# Patient Record
Sex: Female | Born: 1935 | Race: White | Hispanic: No | Smoking: Never smoker
Health system: Southern US, Community
[De-identification: ages and names within clinical notes are randomized; demographics above are authoritative.]

## PROBLEM LIST (undated history)

## (undated) DIAGNOSIS — E7800 Pure hypercholesterolemia, unspecified: Secondary | ICD-10-CM

## (undated) DIAGNOSIS — R053 Chronic cough: Secondary | ICD-10-CM

## (undated) DIAGNOSIS — R05 Cough: Secondary | ICD-10-CM

## (undated) DIAGNOSIS — H9190 Unspecified hearing loss, unspecified ear: Secondary | ICD-10-CM

## (undated) DIAGNOSIS — M810 Age-related osteoporosis without current pathological fracture: Secondary | ICD-10-CM

## (undated) DIAGNOSIS — E785 Hyperlipidemia, unspecified: Secondary | ICD-10-CM

## (undated) DIAGNOSIS — K219 Gastro-esophageal reflux disease without esophagitis: Secondary | ICD-10-CM

## (undated) DIAGNOSIS — C8590 Non-Hodgkin lymphoma, unspecified, unspecified site: Secondary | ICD-10-CM

## (undated) DIAGNOSIS — I10 Essential (primary) hypertension: Secondary | ICD-10-CM

---
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
## 1999-08-22 ENCOUNTER — Encounter: Admission: RE | Admit: 1999-08-22 | Discharge: 1999-08-22 | Payer: Self-pay | Admitting: *Deleted

## 1999-08-22 ENCOUNTER — Encounter: Payer: Self-pay | Admitting: *Deleted

## 2000-05-06 ENCOUNTER — Other Ambulatory Visit: Admission: RE | Admit: 2000-05-06 | Discharge: 2000-05-06 | Payer: Self-pay | Admitting: Internal Medicine

## 2000-07-06 ENCOUNTER — Ambulatory Visit (HOSPITAL_COMMUNITY): Admission: RE | Admit: 2000-07-06 | Discharge: 2000-07-06 | Payer: Self-pay | Admitting: *Deleted

## 2000-09-07 ENCOUNTER — Encounter: Admission: RE | Admit: 2000-09-07 | Discharge: 2000-09-07 | Payer: Self-pay | Admitting: Internal Medicine

## 2000-09-07 ENCOUNTER — Encounter: Payer: Self-pay | Admitting: Internal Medicine

## 2001-09-16 ENCOUNTER — Encounter: Payer: Self-pay | Admitting: Internal Medicine

## 2001-09-16 ENCOUNTER — Encounter: Admission: RE | Admit: 2001-09-16 | Discharge: 2001-09-16 | Payer: Self-pay | Admitting: Internal Medicine

## 2002-09-27 ENCOUNTER — Encounter: Admission: RE | Admit: 2002-09-27 | Discharge: 2002-09-27 | Payer: Self-pay | Admitting: Internal Medicine

## 2002-09-27 ENCOUNTER — Encounter: Payer: Self-pay | Admitting: Internal Medicine

## 2003-10-13 ENCOUNTER — Encounter: Admission: RE | Admit: 2003-10-13 | Discharge: 2003-10-13 | Payer: Self-pay | Admitting: Internal Medicine

## 2004-11-27 ENCOUNTER — Encounter: Admission: RE | Admit: 2004-11-27 | Discharge: 2004-11-27 | Payer: Self-pay | Admitting: Internal Medicine

## 2005-11-28 ENCOUNTER — Encounter: Admission: RE | Admit: 2005-11-28 | Discharge: 2005-11-28 | Payer: Self-pay | Admitting: Internal Medicine

## 2006-05-13 ENCOUNTER — Emergency Department (HOSPITAL_COMMUNITY): Admission: EM | Admit: 2006-05-13 | Discharge: 2006-05-13 | Payer: Self-pay | Admitting: Emergency Medicine

## 2006-12-17 ENCOUNTER — Encounter: Admission: RE | Admit: 2006-12-17 | Discharge: 2006-12-17 | Payer: Self-pay | Admitting: Internal Medicine

## 2007-12-27 ENCOUNTER — Encounter: Admission: RE | Admit: 2007-12-27 | Discharge: 2007-12-27 | Payer: Self-pay | Admitting: Internal Medicine

## 2008-12-27 ENCOUNTER — Encounter: Admission: RE | Admit: 2008-12-27 | Discharge: 2008-12-27 | Payer: Self-pay | Admitting: Internal Medicine

## 2009-01-27 HISTORY — PX: CATARACT EXTRACTION: SUR2

## 2010-01-04 ENCOUNTER — Encounter
Admission: RE | Admit: 2010-01-04 | Discharge: 2010-01-04 | Payer: Self-pay | Source: Home / Self Care | Attending: Internal Medicine | Admitting: Internal Medicine

## 2010-02-17 ENCOUNTER — Encounter: Payer: Self-pay | Admitting: Internal Medicine

## 2010-06-14 NOTE — Procedures (Signed)
[facility]  Patient:    Mary Kim, Mary Kim                        MRN: [id number] Adm. Date:  [id number] Attending:  Sabino Gasser                           Procedure Report  PROCEDURE:  Colonoscopy.  INDICATIONS:  Colon cancer screening.  ANESTHESIA:  Demerol 50 mg, Versed 5 mg.  DESCRIPTION OF PROCEDURE:  With patient mildly sedated in the left lateral decubitus position, the Olympus videoscopic pediatric colonoscope PCF-160 was inserted in the rectum and passed under direct vision to the cecum, identified by the ileocecal valve and the tip of the cecum, both of which were photographed.  From this point, the colonoscope was slowly withdrawn, taking circumferential views of the entire colonic mucosa, stopping only in the rectum, which appeared normal on direct view and showed small internal hemorrhoids on retroflexed view.  The endoscope was straightened and withdrawn.  The patients vital signs and pulse oximetry remained stable.  The patient tolerated the procedure well without apparent complications.  FINDINGS:  Internal hemorrhoids, otherwise unremarkable examination.  PLAN:  Repeat examination in five to 10 years. DD:  07/06/00 TD:  07/06/00 Job: [id number]

## 2010-12-16 ENCOUNTER — Other Ambulatory Visit: Payer: Self-pay | Admitting: Internal Medicine

## 2010-12-16 DIAGNOSIS — Z1231 Encounter for screening mammogram for malignant neoplasm of breast: Secondary | ICD-10-CM

## 2011-01-09 ENCOUNTER — Ambulatory Visit: Payer: Self-pay

## 2011-01-10 ENCOUNTER — Ambulatory Visit
Admission: RE | Admit: 2011-01-10 | Discharge: 2011-01-10 | Disposition: A | Discharge status: Home / Self Care | Payer: Medicare Other | Source: Ambulatory Visit | Attending: Internal Medicine | Admitting: Internal Medicine

## 2011-01-10 DIAGNOSIS — Z1231 Encounter for screening mammogram for malignant neoplasm of breast: Secondary | ICD-10-CM

## 2011-03-26 DIAGNOSIS — I10 Essential (primary) hypertension: Secondary | ICD-10-CM | POA: Diagnosis not present

## 2011-03-26 DIAGNOSIS — M810 Age-related osteoporosis without current pathological fracture: Secondary | ICD-10-CM | POA: Diagnosis not present

## 2011-03-26 DIAGNOSIS — E785 Hyperlipidemia, unspecified: Secondary | ICD-10-CM | POA: Diagnosis not present

## 2011-03-26 DIAGNOSIS — E559 Vitamin D deficiency, unspecified: Secondary | ICD-10-CM | POA: Diagnosis not present

## 2011-04-21 DIAGNOSIS — H3530 Unspecified macular degeneration: Secondary | ICD-10-CM | POA: Diagnosis not present

## 2011-04-21 DIAGNOSIS — Z961 Presence of intraocular lens: Secondary | ICD-10-CM | POA: Diagnosis not present

## 2011-04-21 DIAGNOSIS — H52209 Unspecified astigmatism, unspecified eye: Secondary | ICD-10-CM | POA: Diagnosis not present

## 2011-04-21 DIAGNOSIS — H43819 Vitreous degeneration, unspecified eye: Secondary | ICD-10-CM | POA: Diagnosis not present

## 2011-06-03 DIAGNOSIS — M810 Age-related osteoporosis without current pathological fracture: Secondary | ICD-10-CM | POA: Diagnosis not present

## 2011-09-19 DIAGNOSIS — R82998 Other abnormal findings in urine: Secondary | ICD-10-CM | POA: Diagnosis not present

## 2011-09-19 DIAGNOSIS — I10 Essential (primary) hypertension: Secondary | ICD-10-CM | POA: Diagnosis not present

## 2011-09-19 DIAGNOSIS — E785 Hyperlipidemia, unspecified: Secondary | ICD-10-CM | POA: Diagnosis not present

## 2011-09-19 DIAGNOSIS — M810 Age-related osteoporosis without current pathological fracture: Secondary | ICD-10-CM | POA: Diagnosis not present

## 2011-09-19 DIAGNOSIS — E559 Vitamin D deficiency, unspecified: Secondary | ICD-10-CM | POA: Diagnosis not present

## 2011-09-26 DIAGNOSIS — E785 Hyperlipidemia, unspecified: Secondary | ICD-10-CM | POA: Diagnosis not present

## 2011-09-26 DIAGNOSIS — Z0000 Encounter for general adult medical examination without abnormal findings: Secondary | ICD-10-CM | POA: Diagnosis not present

## 2011-09-26 DIAGNOSIS — Z23 Encounter for immunization: Secondary | ICD-10-CM | POA: Diagnosis not present

## 2011-09-26 DIAGNOSIS — I10 Essential (primary) hypertension: Secondary | ICD-10-CM | POA: Diagnosis not present

## 2011-09-30 DIAGNOSIS — Z1212 Encounter for screening for malignant neoplasm of rectum: Secondary | ICD-10-CM | POA: Diagnosis not present

## 2011-10-22 DIAGNOSIS — M810 Age-related osteoporosis without current pathological fracture: Secondary | ICD-10-CM | POA: Diagnosis not present

## 2011-12-04 DIAGNOSIS — M810 Age-related osteoporosis without current pathological fracture: Secondary | ICD-10-CM | POA: Diagnosis not present

## 2011-12-10 ENCOUNTER — Other Ambulatory Visit: Payer: Self-pay | Admitting: Internal Medicine

## 2011-12-10 DIAGNOSIS — Z1231 Encounter for screening mammogram for malignant neoplasm of breast: Secondary | ICD-10-CM

## 2011-12-15 ENCOUNTER — Emergency Department (HOSPITAL_COMMUNITY)
Admission: EM | Admit: 2011-12-15 | Discharge: 2011-12-15 | Disposition: A | Discharge status: Home / Self Care | Payer: Medicare Other | Attending: Emergency Medicine | Admitting: Emergency Medicine

## 2011-12-15 ENCOUNTER — Emergency Department (HOSPITAL_COMMUNITY): Payer: Medicare Other

## 2011-12-15 ENCOUNTER — Encounter (HOSPITAL_COMMUNITY): Payer: Self-pay

## 2011-12-15 DIAGNOSIS — R42 Dizziness and giddiness: Secondary | ICD-10-CM | POA: Insufficient documentation

## 2011-12-15 DIAGNOSIS — H9190 Unspecified hearing loss, unspecified ear: Secondary | ICD-10-CM | POA: Diagnosis not present

## 2011-12-15 DIAGNOSIS — R55 Syncope and collapse: Secondary | ICD-10-CM | POA: Diagnosis not present

## 2011-12-15 DIAGNOSIS — I10 Essential (primary) hypertension: Secondary | ICD-10-CM | POA: Insufficient documentation

## 2011-12-15 DIAGNOSIS — E785 Hyperlipidemia, unspecified: Secondary | ICD-10-CM | POA: Insufficient documentation

## 2011-12-15 DIAGNOSIS — M810 Age-related osteoporosis without current pathological fracture: Secondary | ICD-10-CM | POA: Insufficient documentation

## 2011-12-15 DIAGNOSIS — R404 Transient alteration of awareness: Secondary | ICD-10-CM | POA: Diagnosis not present

## 2011-12-15 DIAGNOSIS — R5381 Other malaise: Secondary | ICD-10-CM | POA: Diagnosis not present

## 2011-12-15 HISTORY — DX: Age-related osteoporosis without current pathological fracture: M81.0

## 2011-12-15 HISTORY — DX: Unspecified hearing loss, unspecified ear: H91.90

## 2011-12-15 HISTORY — DX: Hyperlipidemia, unspecified: E78.5

## 2011-12-15 HISTORY — DX: Essential (primary) hypertension: I10

## 2011-12-15 LAB — COMPREHENSIVE METABOLIC PANEL
ALT: 22 U/L (ref 0–35)
AST: 23 U/L (ref 0–37)
Albumin: 4 g/dL (ref 3.5–5.2)
Alkaline Phosphatase: 59 U/L (ref 39–117)
Calcium: 9.4 mg/dL (ref 8.4–10.5)
Chloride: 103 mEq/L (ref 96–112)
Creatinine, Ser: 1.02 mg/dL (ref 0.50–1.10)
Potassium: 4.4 mEq/L (ref 3.5–5.1)
Total Protein: 6.5 g/dL (ref 6.0–8.3)

## 2011-12-15 LAB — URINALYSIS, ROUTINE W REFLEX MICROSCOPIC
Glucose, UA: NEGATIVE mg/dL
Hgb urine dipstick: NEGATIVE
Nitrite: NEGATIVE
Protein, ur: 30 mg/dL — AB
Specific Gravity, Urine: 1.017 (ref 1.005–1.030)

## 2011-12-15 LAB — CBC WITH DIFFERENTIAL/PLATELET
Basophils Absolute: 0 10*3/uL (ref 0.0–0.1)
Basophils Relative: 1 % (ref 0–1)
HCT: 36.7 % (ref 36.0–46.0)
Hemoglobin: 12.3 g/dL (ref 12.0–15.0)
Lymphs Abs: 1.8 10*3/uL (ref 0.7–4.0)
MCH: 32.4 pg (ref 26.0–34.0)
MCHC: 33.5 g/dL (ref 30.0–36.0)
MCV: 96.6 fL (ref 78.0–100.0)
Monocytes Relative: 5 % (ref 3–12)
Neutrophils Relative %: 70 % (ref 43–77)
RBC: 3.8 MIL/uL — ABNORMAL LOW (ref 3.87–5.11)
RDW: 12.7 % (ref 11.5–15.5)
WBC: 7.8 10*3/uL (ref 4.0–10.5)

## 2011-12-15 LAB — URINE MICROSCOPIC-ADD ON

## 2011-12-15 LAB — POCT I-STAT TROPONIN I: Troponin i, poc: 0 ng/mL (ref 0.00–0.08)

## 2011-12-15 NOTE — ED Notes (Signed)
PT sts she was standing in her hallway talking to her friends, then she passed out.  Pt sts she got warm and became dizzy and the next thing she new she was coming to on the floor.

## 2011-12-15 NOTE — ED Provider Notes (Signed)
History     CSN: [id number]  Arrival date & time 12/15/11  2952   First MD Initiated Contact with Patient 12/15/11 1001      Chief Complaint  Patient presents with  . Loss of Consciousness    (Consider location/radiation/quality/duration/timing/severity/associated sxs/prior treatment) Patient is a 78 y.o. female presenting with syncope. The history is provided by the patient. No language interpreter was used.  Loss of Consciousness This is a new problem. The current episode started today. The problem occurs rarely. The problem has been resolved. Pertinent negatives include no abdominal pain, chest pain, congestion, coughing, fever, headaches, nausea, numbness, sore throat, swollen glands, urinary symptoms, vomiting or weakness. She has tried nothing for the symptoms.   78 year old female coming in with a near syncopal episode. States that she was standing in the doorway talking to her neighbor and became dizzy. She then eased herself into a sitting position and remembers going down to the floor. Denies LOC.   Patient states that this has never happened her before. States that one month ago she had a UTI and was treated with antibiotics. PCP is Dr. Timothy Lasso.  Patient did not hit her head. Denies chest pain, fever, shortness of breath, weakness, paresis, garbled speech, headache. Nn toxic appearance.   Past Medical History  Diagnosis Date  . Hypertension   . Hyperlipidemia   . Osteoporosis   . Hearing loss     Past Surgical History  Procedure Date  . Cataract extraction 2011    History reviewed. No pertinent family history.  History  Substance Use Topics  . Smoking status: Never Smoker   . Smokeless tobacco: Not on file  . Alcohol Use: No    OB History    Grav Para Term Preterm Abortions TAB SAB Ect Mult Living                  Review of Systems  Constitutional: Negative.  Negative for fever.  HENT: Negative.  Negative for congestion, sore throat, rhinorrhea and  postnasal drip.   Eyes: Negative.   Respiratory: Negative.  Negative for cough and shortness of breath.   Cardiovascular: Positive for syncope. Negative for chest pain and palpitations.  Gastrointestinal: Negative.  Negative for nausea, vomiting and abdominal pain.  Musculoskeletal: Negative for back pain and gait problem.  Neurological: Positive for light-headedness. Negative for syncope, facial asymmetry, speech difficulty, weakness, numbness and headaches.  Psychiatric/Behavioral: Negative.   All other systems reviewed and are negative.    Allergies  Review of patient's allergies indicates not on file.  Home Medications  No current outpatient prescriptions on file.  BP 137/77  Pulse 75  Temp 98.3 F (36.8 C) (Oral)  Resp 16  SpO2 100%  Physical Exam  Nursing note and vitals reviewed. Constitutional: She is oriented to person, place, and time. She appears well-developed and well-nourished.  HENT:  Head: Normocephalic and atraumatic.  Eyes: Conjunctivae normal and EOM are normal. Pupils are equal, round, and reactive to light.  Neck: Normal range of motion. Neck supple.  Cardiovascular: Normal rate, regular rhythm and normal heart sounds.   Pulmonary/Chest: Effort normal and breath sounds normal. No respiratory distress. She has no wheezes.  Abdominal: Soft. She exhibits no distension. There is no tenderness.  Musculoskeletal: Normal range of motion. She exhibits no edema and no tenderness.  Neurological: She is alert and oriented to person, place, and time. She has normal reflexes.  Skin: Skin is warm and dry.  Psychiatric: She has a normal mood  and affect.    ED Course  Procedures (including critical care time)  Patient feels better and wants to go home.    Dr Timothy Lasso notified patient is in ER .  He will follow her close after discharged. Office will call her tomorrow for appointment to be seen.     Labs Reviewed  CBC WITH DIFFERENTIAL  COMPREHENSIVE METABOLIC PANEL   URINALYSIS, ROUTINE W REFLEX MICROSCOPIC   No results found.   No diagnosis found.    MDM  78 yo female with near syncope vs syncope prior to arrival.  Patient remembers sitting slowly to the floor.  EKG unremarkable, - troponin, CBC and cmet unremarkable.  U/a with protein.  Chest x-ray shows no acute findings and reviewed by myself.  Patient wants to go home.  Discussed with Dr. Timothy Lasso and patient will follow up tomorrow.  Patient understands to return for worsening symptoms.  Shared visit with Dr. Rulon Abide.    Date: 12/15/2011  Rate: 69  Rhythm: normal sinus rhythm  QRS Axis: normal  Intervals: normal  ST/T Wave abnormalities: normal  Conduction Disutrbances:none  Narrative Interpretation:   Old EKG Reviewed: unchanged   Labs Reviewed  CBC WITH DIFFERENTIAL - Abnormal; Notable for the following:    RBC 3.80 (*)     All other components within normal limits  COMPREHENSIVE METABOLIC PANEL - Abnormal; Notable for the following:    Glucose, Bld 100 (*)     GFR calc non Af Amer 48 (*)     GFR calc Af Amer 56 (*)     All other components within normal limits  URINALYSIS, ROUTINE W REFLEX MICROSCOPIC - Abnormal; Notable for the following:    Protein, ur 30 (*)     All other components within normal limits  POCT I-STAT TROPONIN I  URINE MICROSCOPIC-ADD ON  LAB REPORT - SCANNED           Remi Haggard, NP 12/16/11 1239

## 2011-12-16 NOTE — ED Provider Notes (Signed)
Medical screening examination/treatment/procedure(s) were conducted as a shared visit with non-physician practitioner(s) and myself.  I personally evaluated the patient during the encounter Jones Skene, M.D.  Mary Kim is a 78 y.o. female presenting with near syncopal event.  Pt is imprecise about actual LOC, says she was speaking with a neighbor by the laundry, had a prodromal sensation of clammy skin, light sweating, lightheadedness, then sank to the floor against the wall and cam to rest on her buttocks.  Denies injury, this was witnessed, but witness not present in ER for interview. She had no other assocaited  VITAL SIGNS:   Filed Vitals:   12/15/11 1318  BP: 142/84  Pulse: 90  Temp:   Resp:    CONSTITUTIONAL: Awake, oriented x4, well appearing elderly woman appears non-toxic HENT: Atraumatic, normocephalic, oral mucosa pink and moist, airway patent. Nares patent without drainage. External ears normal. EYES: Conjunctiva clear, EOMI, PERRLA NECK: Trachea midline, non-tender, supple CARDIOVASCULAR: Normal heart rate, Normal rhythm, No murmurs, rubs, gallops PULMONARY/CHEST: Clear to auscultation, no rhonchi, wheezes, or rales. Symmetrical breath sounds. Non-tender. ABDOMINAL: Non-distended, soft, non-tender - no rebound or guarding.  BS normal. NEUROLOGIC: Non-focal, moving all four extremities, no gross sensory or motor deficits. EXTREMITIES: No clubbing, cyanosis, or edema SKIN: Warm, Dry, No erythema, No rash  JYN:WGNFAO Mary Kim is a 78 y.o. female presenting with syncope. Pt had clear prodrome - think this likely vagal episode versus potentially lethal arrythmia.  There were no other associated symptoms.  Pt is completely normal now.  Pt would like to go home, NP has arranged close follow up with Dr. Timothy Lasso tomorrow.  Pt understands medical plan and will follow up tomorrow.    Jones Skene, MD 12/16/11 [phone]

## 2011-12-19 DIAGNOSIS — Z1331 Encounter for screening for depression: Secondary | ICD-10-CM | POA: Diagnosis not present

## 2011-12-19 DIAGNOSIS — R55 Syncope and collapse: Secondary | ICD-10-CM | POA: Diagnosis not present

## 2011-12-30 DIAGNOSIS — I658 Occlusion and stenosis of other precerebral arteries: Secondary | ICD-10-CM | POA: Diagnosis not present

## 2011-12-30 DIAGNOSIS — R55 Syncope and collapse: Secondary | ICD-10-CM | POA: Diagnosis not present

## 2011-12-31 DIAGNOSIS — I359 Nonrheumatic aortic valve disorder, unspecified: Secondary | ICD-10-CM | POA: Diagnosis not present

## 2012-01-22 ENCOUNTER — Ambulatory Visit: Payer: Medicare Other

## 2012-01-27 ENCOUNTER — Ambulatory Visit
Admission: RE | Admit: 2012-01-27 | Discharge: 2012-01-27 | Disposition: A | Discharge status: Home / Self Care | Payer: Medicare Other | Source: Ambulatory Visit | Attending: Internal Medicine | Admitting: Internal Medicine

## 2012-01-27 DIAGNOSIS — Z1231 Encounter for screening mammogram for malignant neoplasm of breast: Secondary | ICD-10-CM

## 2012-03-26 DIAGNOSIS — I10 Essential (primary) hypertension: Secondary | ICD-10-CM | POA: Diagnosis not present

## 2012-03-26 DIAGNOSIS — E785 Hyperlipidemia, unspecified: Secondary | ICD-10-CM | POA: Diagnosis not present

## 2012-03-26 DIAGNOSIS — M810 Age-related osteoporosis without current pathological fracture: Secondary | ICD-10-CM | POA: Diagnosis not present

## 2012-03-26 DIAGNOSIS — R55 Syncope and collapse: Secondary | ICD-10-CM | POA: Diagnosis not present

## 2012-04-22 DIAGNOSIS — Z961 Presence of intraocular lens: Secondary | ICD-10-CM | POA: Diagnosis not present

## 2012-04-22 DIAGNOSIS — H3530 Unspecified macular degeneration: Secondary | ICD-10-CM | POA: Diagnosis not present

## 2012-04-22 DIAGNOSIS — H43819 Vitreous degeneration, unspecified eye: Secondary | ICD-10-CM | POA: Diagnosis not present

## 2012-04-22 DIAGNOSIS — H02059 Trichiasis without entropian unspecified eye, unspecified eyelid: Secondary | ICD-10-CM | POA: Diagnosis not present

## 2012-05-04 DIAGNOSIS — H905 Unspecified sensorineural hearing loss: Secondary | ICD-10-CM | POA: Diagnosis not present

## 2012-06-03 DIAGNOSIS — E559 Vitamin D deficiency, unspecified: Secondary | ICD-10-CM | POA: Diagnosis not present

## 2012-06-03 DIAGNOSIS — M810 Age-related osteoporosis without current pathological fracture: Secondary | ICD-10-CM | POA: Diagnosis not present

## 2012-08-25 DIAGNOSIS — L820 Inflamed seborrheic keratosis: Secondary | ICD-10-CM | POA: Diagnosis not present

## 2012-09-24 DIAGNOSIS — E559 Vitamin D deficiency, unspecified: Secondary | ICD-10-CM | POA: Diagnosis not present

## 2012-09-24 DIAGNOSIS — E785 Hyperlipidemia, unspecified: Secondary | ICD-10-CM | POA: Diagnosis not present

## 2012-09-24 DIAGNOSIS — I10 Essential (primary) hypertension: Secondary | ICD-10-CM | POA: Diagnosis not present

## 2012-10-01 DIAGNOSIS — H9190 Unspecified hearing loss, unspecified ear: Secondary | ICD-10-CM | POA: Diagnosis not present

## 2012-10-01 DIAGNOSIS — Z1331 Encounter for screening for depression: Secondary | ICD-10-CM | POA: Diagnosis not present

## 2012-10-01 DIAGNOSIS — E781 Pure hyperglyceridemia: Secondary | ICD-10-CM | POA: Diagnosis not present

## 2012-10-01 DIAGNOSIS — Z23 Encounter for immunization: Secondary | ICD-10-CM | POA: Diagnosis not present

## 2012-10-01 DIAGNOSIS — Z1212 Encounter for screening for malignant neoplasm of rectum: Secondary | ICD-10-CM | POA: Diagnosis not present

## 2012-10-01 DIAGNOSIS — I454 Nonspecific intraventricular block: Secondary | ICD-10-CM | POA: Diagnosis not present

## 2012-10-01 DIAGNOSIS — E785 Hyperlipidemia, unspecified: Secondary | ICD-10-CM | POA: Diagnosis not present

## 2012-10-01 DIAGNOSIS — M810 Age-related osteoporosis without current pathological fracture: Secondary | ICD-10-CM | POA: Diagnosis not present

## 2012-10-01 DIAGNOSIS — R55 Syncope and collapse: Secondary | ICD-10-CM | POA: Diagnosis not present

## 2012-10-01 DIAGNOSIS — Z0000 Encounter for general adult medical examination without abnormal findings: Secondary | ICD-10-CM | POA: Diagnosis not present

## 2012-10-01 DIAGNOSIS — I10 Essential (primary) hypertension: Secondary | ICD-10-CM | POA: Diagnosis not present

## 2012-12-07 DIAGNOSIS — M810 Age-related osteoporosis without current pathological fracture: Secondary | ICD-10-CM | POA: Diagnosis not present

## 2012-12-29 ENCOUNTER — Other Ambulatory Visit: Payer: Self-pay

## 2012-12-29 DIAGNOSIS — Z1231 Encounter for screening mammogram for malignant neoplasm of breast: Secondary | ICD-10-CM

## 2013-01-23 ENCOUNTER — Encounter (HOSPITAL_COMMUNITY): Payer: Self-pay | Admitting: Emergency Medicine

## 2013-01-23 ENCOUNTER — Emergency Department (HOSPITAL_COMMUNITY)
Admission: EM | Admit: 2013-01-23 | Discharge: 2013-01-23 | Disposition: A | Discharge status: Home / Self Care | Payer: Medicare Other | Attending: Emergency Medicine | Admitting: Emergency Medicine

## 2013-01-23 DIAGNOSIS — E785 Hyperlipidemia, unspecified: Secondary | ICD-10-CM | POA: Insufficient documentation

## 2013-01-23 DIAGNOSIS — I10 Essential (primary) hypertension: Secondary | ICD-10-CM | POA: Insufficient documentation

## 2013-01-23 DIAGNOSIS — S01501A Unspecified open wound of lip, initial encounter: Secondary | ICD-10-CM | POA: Insufficient documentation

## 2013-01-23 DIAGNOSIS — Z79899 Other long term (current) drug therapy: Secondary | ICD-10-CM | POA: Diagnosis not present

## 2013-01-23 DIAGNOSIS — R6889 Other general symptoms and signs: Secondary | ICD-10-CM | POA: Diagnosis not present

## 2013-01-23 DIAGNOSIS — H9190 Unspecified hearing loss, unspecified ear: Secondary | ICD-10-CM | POA: Insufficient documentation

## 2013-01-23 DIAGNOSIS — Y9301 Activity, walking, marching and hiking: Secondary | ICD-10-CM | POA: Insufficient documentation

## 2013-01-23 DIAGNOSIS — S022XXA Fracture of nasal bones, initial encounter for closed fracture: Secondary | ICD-10-CM | POA: Diagnosis not present

## 2013-01-23 DIAGNOSIS — Z7982 Long term (current) use of aspirin: Secondary | ICD-10-CM | POA: Diagnosis not present

## 2013-01-23 DIAGNOSIS — S01511A Laceration without foreign body of lip, initial encounter: Secondary | ICD-10-CM

## 2013-01-23 DIAGNOSIS — W010XXA Fall on same level from slipping, tripping and stumbling without subsequent striking against object, initial encounter: Secondary | ICD-10-CM | POA: Insufficient documentation

## 2013-01-23 DIAGNOSIS — W19XXXA Unspecified fall, initial encounter: Secondary | ICD-10-CM

## 2013-01-23 DIAGNOSIS — Z8739 Personal history of other diseases of the musculoskeletal system and connective tissue: Secondary | ICD-10-CM | POA: Diagnosis not present

## 2013-01-23 DIAGNOSIS — Y9229 Other specified public building as the place of occurrence of the external cause: Secondary | ICD-10-CM | POA: Insufficient documentation

## 2013-01-23 MED ORDER — HYDROCODONE-ACETAMINOPHEN 5-325 MG PO TABS
1.0000 | ORAL_TABLET | Freq: Once | ORAL | Status: AC
  Administered 2013-01-23: 1 via ORAL
  Filled 2013-01-23: qty 1

## 2013-01-23 NOTE — ED Provider Notes (Signed)
Medical screening examination/treatment/procedure(s) were conducted as a shared visit with non-physician practitioner(s) and myself.  I personally evaluated the patient during the encounter.  EKG Interpretation   None       Medication for CT head, neck, face imaging.  Obvious nasal fracture.  No tenderness over her maxillary sinuses.  No trismus or malocclusion.  No tenderness over the bilateral TMJ joints.  No dental intrusion injury.  lip laceration repaired.  Full range of motion bilateral hips.  Alert and oriented.  Non focal neuro exam  Lyanne Co, MD 01/23/13 1650

## 2013-01-23 NOTE — ED Provider Notes (Signed)
CSN: [id number]     Arrival date & time 01/23/13  1255 History   First MD Initiated Contact with Patient 01/23/13 1403     Chief Complaint  Patient presents with  . Fall  . Lip Laceration   (Consider location/radiation/quality/duration/timing/severity/associated sxs/prior Treatment) HPI Patient presents emergency department following a fall that occurred just prior to arrival patient states she was walking in her [employer] parking lot, when she slipped on some gravel and fell.  She states she did not lose consciousness.  Does not think she hit her head other than her mouth and nose.  Patient denies loss consciousness, chest pain, shortness of breath, weakness, numbness, dizziness, abdominal pain, back pain, neck pain, blurred vision, headache, dizziness, or syncope.  The patient, states, that she did not take any medications prior to arrival. Past Medical History  Diagnosis Date  . Hypertension   . Hyperlipidemia   . Osteoporosis   . Hearing loss    Past Surgical History  Procedure Laterality Date  . Cataract extraction  2011   No family history on file. History  Substance Use Topics  . Smoking status: Never Smoker   . Smokeless tobacco: Not on file  . Alcohol Use: No   OB History   Grav Para Term Preterm Abortions TAB SAB Ect Mult Living                 Review of Systems All other systems negative except as documented in the HPI. All pertinent positives and negatives as reviewed in the HPI. Allergies  Review of patient's allergies indicates no known allergies.  Home Medications   Current Outpatient Rx  Name  Route  Sig  Dispense  Refill  . amLODipine (NORVASC) 10 MG tablet   Oral   Take 10 mg by mouth daily.         Marland Kitchen aspirin EC 81 MG tablet   Oral   Take 81 mg by mouth daily.         Marland Kitchen atorvastatin (LIPITOR) 20 MG tablet   Oral   Take 20 mg by mouth daily.         . Calcium Carbonate-Vitamin D (CALTRATE 600+D) 600-400 MG-UNIT per tablet   Oral   Take 1  tablet by mouth daily.         . cholecalciferol (VITAMIN D) 1000 UNITS tablet   Oral   Take 1,000 Units by mouth daily.         Marland Kitchen denosumab (PROLIA) 60 MG/ML SOLN injection   Subcutaneous   Inject 60 mg into the skin every 6 (six) months. Given in May and Nov. - Administer in upper arm, thigh, or abdomen         . Multiple Vitamin (MULTIVITAMIN WITH MINERALS) TABS   Oral   Take 1 tablet by mouth daily.         . Multiple Vitamins-Minerals (VISION-VITE PRESERVE PO)   Oral   Take 1 tablet by mouth daily.          BP 154/85  Pulse 80  Temp(Src) 98.5 F (36.9 C) (Oral)  Resp 16  SpO2 95% Physical Exam  Nursing note and vitals reviewed. Constitutional: She is oriented to person, place, and time. She appears well-developed and well-nourished. No distress.  HENT:  Head: Normocephalic.  Mouth/Throat: Oropharynx is clear and moist.  Eyes: Pupils are equal, round, and reactive to light.  Neck: Normal range of motion. Neck supple.  Cardiovascular: Normal rate, regular rhythm and normal heart  sounds.  Exam reveals no gallop and no friction rub.   No murmur heard. Pulmonary/Chest: Effort normal and breath sounds normal. No respiratory distress.  Neurological: She is alert and oriented to person, place, and time. She exhibits normal muscle tone. Coordination normal.  Skin: Skin is warm and dry.    ED Course  Procedures (including critical care time) Patient has a nasal fracture, based on clinical exam.  Patient referred back to her primary care Dr. for further evaluation.  Told of sutures out in 5 days.  LACERATION REPAIR Performed by: Carlyle Dolly Authorized by: Carlyle Dolly Consent: Verbal consent obtained. Risks and benefits: risks, benefits and alternatives were discussed Consent given by: patient Patient identity confirmed: provided demographic data Prepped and Draped in normal sterile fashion Wound explored  Laceration Location: Mid upper  lip  Laceration Length: 3 cm  No Foreign Bodies seen or palpated  Anesthesia: local infiltration  Local anesthetic: lidocaine 2 % without epinephrine  Anesthetic total: 3 ml  Irrigation method: syringe Amount of cleaning: standard  Skin closure: 7-0 Prolene   Number of sutures: 6   Technique: Simple, interrupted   Patient tolerance: Patient tolerated the procedure well with no immediate complications.    Carlyle Dolly, PA-C 01/23/13 [phone]

## 2013-01-23 NOTE — ED Notes (Addendum)
Per EMS: Pt reports falling twenty minutes ago at her church in the parking lot. Pt tripped while walking out. Pt denies hitting her head, denies LOC, denies anticoagulant therapy. Pt has laceration to the upper lip that is 2 cm in size and goes through to the exterior of the lip, pt reports biting her lip. Pt had an artifical cap to the left central incisor, which the surface of that cap was removed during the fall. Pt is A/O x4, ambulatory, bleeding is controlled at this time, and patient has no other complaint.

## 2013-01-23 NOTE — ED Notes (Signed)
Patient ambulated without assistance to restroom and back.

## 2013-01-28 ENCOUNTER — Ambulatory Visit: Payer: Medicare Other

## 2013-02-02 DIAGNOSIS — IMO0002 Reserved for concepts with insufficient information to code with codable children: Secondary | ICD-10-CM | POA: Diagnosis not present

## 2013-02-02 DIAGNOSIS — Z9181 History of falling: Secondary | ICD-10-CM | POA: Diagnosis not present

## 2013-02-02 DIAGNOSIS — S01501A Unspecified open wound of lip, initial encounter: Secondary | ICD-10-CM | POA: Diagnosis not present

## 2013-02-10 ENCOUNTER — Ambulatory Visit: Payer: Medicare Other

## 2013-03-31 DIAGNOSIS — E785 Hyperlipidemia, unspecified: Secondary | ICD-10-CM | POA: Diagnosis not present

## 2013-03-31 DIAGNOSIS — S01501A Unspecified open wound of lip, initial encounter: Secondary | ICD-10-CM | POA: Diagnosis not present

## 2013-03-31 DIAGNOSIS — M810 Age-related osteoporosis without current pathological fracture: Secondary | ICD-10-CM | POA: Diagnosis not present

## 2013-03-31 DIAGNOSIS — IMO0002 Reserved for concepts with insufficient information to code with codable children: Secondary | ICD-10-CM | POA: Diagnosis not present

## 2013-03-31 DIAGNOSIS — I10 Essential (primary) hypertension: Secondary | ICD-10-CM | POA: Diagnosis not present

## 2013-04-25 DIAGNOSIS — Z961 Presence of intraocular lens: Secondary | ICD-10-CM | POA: Diagnosis not present

## 2013-04-25 DIAGNOSIS — H3530 Unspecified macular degeneration: Secondary | ICD-10-CM | POA: Diagnosis not present

## 2013-04-25 DIAGNOSIS — H52209 Unspecified astigmatism, unspecified eye: Secondary | ICD-10-CM | POA: Diagnosis not present

## 2013-04-25 DIAGNOSIS — H43819 Vitreous degeneration, unspecified eye: Secondary | ICD-10-CM | POA: Diagnosis not present

## 2013-06-07 DIAGNOSIS — M810 Age-related osteoporosis without current pathological fracture: Secondary | ICD-10-CM | POA: Diagnosis not present

## 2013-06-08 ENCOUNTER — Ambulatory Visit
Admission: RE | Admit: 2013-06-08 | Discharge: 2013-06-08 | Disposition: A | Discharge status: Home / Self Care | Payer: Medicare Other | Source: Ambulatory Visit

## 2013-06-08 ENCOUNTER — Encounter (INDEPENDENT_AMBULATORY_CARE_PROVIDER_SITE_OTHER): Payer: Self-pay

## 2013-06-08 DIAGNOSIS — Z1231 Encounter for screening mammogram for malignant neoplasm of breast: Secondary | ICD-10-CM

## 2013-09-05 DIAGNOSIS — J209 Acute bronchitis, unspecified: Secondary | ICD-10-CM | POA: Diagnosis not present

## 2013-09-05 DIAGNOSIS — I10 Essential (primary) hypertension: Secondary | ICD-10-CM | POA: Diagnosis not present

## 2013-09-05 DIAGNOSIS — R0602 Shortness of breath: Secondary | ICD-10-CM | POA: Diagnosis not present

## 2013-09-05 DIAGNOSIS — IMO0002 Reserved for concepts with insufficient information to code with codable children: Secondary | ICD-10-CM | POA: Diagnosis not present

## 2013-09-27 DIAGNOSIS — E781 Pure hyperglyceridemia: Secondary | ICD-10-CM | POA: Diagnosis not present

## 2013-09-27 DIAGNOSIS — E559 Vitamin D deficiency, unspecified: Secondary | ICD-10-CM | POA: Diagnosis not present

## 2013-09-27 DIAGNOSIS — I10 Essential (primary) hypertension: Secondary | ICD-10-CM | POA: Diagnosis not present

## 2013-09-27 DIAGNOSIS — E785 Hyperlipidemia, unspecified: Secondary | ICD-10-CM | POA: Diagnosis not present

## 2013-10-04 DIAGNOSIS — Z1212 Encounter for screening for malignant neoplasm of rectum: Secondary | ICD-10-CM | POA: Diagnosis not present

## 2013-10-04 DIAGNOSIS — Z1331 Encounter for screening for depression: Secondary | ICD-10-CM | POA: Diagnosis not present

## 2013-10-04 DIAGNOSIS — M810 Age-related osteoporosis without current pathological fracture: Secondary | ICD-10-CM | POA: Diagnosis not present

## 2013-10-04 DIAGNOSIS — E785 Hyperlipidemia, unspecified: Secondary | ICD-10-CM | POA: Diagnosis not present

## 2013-10-04 DIAGNOSIS — R141 Gas pain: Secondary | ICD-10-CM | POA: Diagnosis not present

## 2013-10-04 DIAGNOSIS — R059 Cough, unspecified: Secondary | ICD-10-CM | POA: Diagnosis not present

## 2013-10-04 DIAGNOSIS — I10 Essential (primary) hypertension: Secondary | ICD-10-CM | POA: Diagnosis not present

## 2013-10-04 DIAGNOSIS — R05 Cough: Secondary | ICD-10-CM | POA: Diagnosis not present

## 2013-10-04 DIAGNOSIS — H9190 Unspecified hearing loss, unspecified ear: Secondary | ICD-10-CM | POA: Diagnosis not present

## 2013-10-04 DIAGNOSIS — Z0000 Encounter for general adult medical examination without abnormal findings: Secondary | ICD-10-CM | POA: Diagnosis not present

## 2013-10-04 DIAGNOSIS — I454 Nonspecific intraventricular block: Secondary | ICD-10-CM | POA: Diagnosis not present

## 2013-10-04 DIAGNOSIS — R142 Eructation: Secondary | ICD-10-CM | POA: Diagnosis not present

## 2013-10-05 DIAGNOSIS — Z1212 Encounter for screening for malignant neoplasm of rectum: Secondary | ICD-10-CM | POA: Diagnosis not present

## 2013-11-12 DIAGNOSIS — Z23 Encounter for immunization: Secondary | ICD-10-CM | POA: Diagnosis not present

## 2013-12-14 DIAGNOSIS — Z6821 Body mass index (BMI) 21.0-21.9, adult: Secondary | ICD-10-CM | POA: Diagnosis not present

## 2013-12-14 DIAGNOSIS — M810 Age-related osteoporosis without current pathological fracture: Secondary | ICD-10-CM | POA: Diagnosis not present

## 2013-12-14 DIAGNOSIS — M859 Disorder of bone density and structure, unspecified: Secondary | ICD-10-CM | POA: Diagnosis not present

## 2014-04-04 DIAGNOSIS — M810 Age-related osteoporosis without current pathological fracture: Secondary | ICD-10-CM | POA: Diagnosis not present

## 2014-04-04 DIAGNOSIS — R05 Cough: Secondary | ICD-10-CM | POA: Diagnosis not present

## 2014-04-04 DIAGNOSIS — R140 Abdominal distension (gaseous): Secondary | ICD-10-CM | POA: Diagnosis not present

## 2014-04-04 DIAGNOSIS — I10 Essential (primary) hypertension: Secondary | ICD-10-CM | POA: Diagnosis not present

## 2014-04-04 DIAGNOSIS — Z6821 Body mass index (BMI) 21.0-21.9, adult: Secondary | ICD-10-CM | POA: Diagnosis not present

## 2014-04-04 DIAGNOSIS — E785 Hyperlipidemia, unspecified: Secondary | ICD-10-CM | POA: Diagnosis not present

## 2014-05-02 DIAGNOSIS — Z961 Presence of intraocular lens: Secondary | ICD-10-CM | POA: Diagnosis not present

## 2014-05-02 DIAGNOSIS — H43813 Vitreous degeneration, bilateral: Secondary | ICD-10-CM | POA: Diagnosis not present

## 2014-05-02 DIAGNOSIS — H52203 Unspecified astigmatism, bilateral: Secondary | ICD-10-CM | POA: Diagnosis not present

## 2014-05-02 DIAGNOSIS — H3531 Nonexudative age-related macular degeneration: Secondary | ICD-10-CM | POA: Diagnosis not present

## 2014-05-08 ENCOUNTER — Other Ambulatory Visit: Payer: Self-pay

## 2014-05-08 DIAGNOSIS — Z1231 Encounter for screening mammogram for malignant neoplasm of breast: Secondary | ICD-10-CM

## 2014-06-12 ENCOUNTER — Ambulatory Visit: Payer: Medicare Other

## 2014-06-15 ENCOUNTER — Ambulatory Visit
Admission: RE | Admit: 2014-06-15 | Discharge: 2014-06-15 | Disposition: A | Discharge status: Home / Self Care | Payer: Medicare Other | Source: Ambulatory Visit

## 2014-06-15 DIAGNOSIS — Z1231 Encounter for screening mammogram for malignant neoplasm of breast: Secondary | ICD-10-CM | POA: Diagnosis not present

## 2014-08-28 ENCOUNTER — Encounter (HOSPITAL_COMMUNITY): Payer: Self-pay | Admitting: Emergency Medicine

## 2014-08-28 ENCOUNTER — Encounter (HOSPITAL_COMMUNITY): Payer: Self-pay

## 2014-08-28 ENCOUNTER — Ambulatory Visit (HOSPITAL_COMMUNITY)
Admission: RE | Admit: 2014-08-28 | Discharge: 2014-08-28 | Disposition: A | Discharge status: Home / Self Care | Payer: Medicare Other | Source: Ambulatory Visit | Attending: Internal Medicine | Admitting: Internal Medicine

## 2014-08-28 ENCOUNTER — Other Ambulatory Visit (HOSPITAL_COMMUNITY): Payer: Self-pay | Admitting: Internal Medicine

## 2014-08-28 DIAGNOSIS — M810 Age-related osteoporosis without current pathological fracture: Secondary | ICD-10-CM | POA: Diagnosis not present

## 2014-08-28 HISTORY — DX: Pure hypercholesterolemia, unspecified: E78.00

## 2014-08-28 MED ORDER — DENOSUMAB 60 MG/ML ~~LOC~~ SOLN
60.0000 mg | Freq: Once | SUBCUTANEOUS | Status: AC
  Administered 2014-08-28: 60 mg via SUBCUTANEOUS
  Filled 2014-08-28: qty 1

## 2014-08-28 NOTE — Discharge Instructions (Signed)
IF YOU ARE GOING TO BE 15 OR MINUTES LATE FOR YOUR APPOINTMENT, PLEASE CALL [phone] TO MAKE OTHER ARRANGEMENTS FOR YOUR TREATMENT  IF YOU ARRIVE EARLY FOR YOUR SCHEDULED APPOINTMENT , YOU MAY HAVE TO WAIT UNTIL YOUR SCHEDULED TIME.Denosumab injection What is this medicine? DENOSUMAB (den oh sue mab) slows bone breakdown. Prolia is used to treat osteoporosis in women after menopause and in men. Delton See is used to prevent bone fractures and other bone problems caused by cancer bone metastases. Delton See is also used to treat giant cell tumor of the bone. This medicine may be used for other purposes; ask your health care provider or pharmacist if you have questions. COMMON BRAND NAME(S): Prolia, XGEVA What should I tell my health care provider before I take this medicine? They need to know if you have any of these conditions: -dental disease -eczema -infection or history of infections -kidney disease or on dialysis -low blood calcium or vitamin D -malabsorption syndrome -scheduled to have surgery or tooth extraction -taking medicine that contains denosumab -thyroid or parathyroid disease -an unusual reaction to denosumab, other medicines, foods, dyes, or preservatives -pregnant or trying to get pregnant -breast-feeding How should I use this medicine? This medicine is for injection under the skin. It is given by a health care professional in a hospital or clinic setting. If you are getting Prolia, a special MedGuide will be given to you by the pharmacist with each prescription and refill. Be sure to read this information carefully each time. For Prolia, talk to your pediatrician regarding the use of this medicine in children. Special care may be needed. For Delton See, talk to your pediatrician regarding the use of this medicine in children. While this drug may be prescribed for children as young as 13 years for selected conditions, precautions do apply. Overdosage: If you think you've taken too much of  this medicine contact a poison control center or emergency room at once. Overdosage: If you think you have taken too much of this medicine contact a poison control center or emergency room at once. NOTE: This medicine is only for you. Do not share this medicine with others. What if I miss a dose? It is important not to miss your dose. Call your doctor or health care professional if you are unable to keep an appointment. What may interact with this medicine? Do not take this medicine with any of the following medications: -other medicines containing denosumab This medicine may also interact with the following medications: -medicines that suppress the immune system -medicines that treat cancer -steroid medicines like prednisone or cortisone This list may not describe all possible interactions. Give your health care provider a list of all the medicines, herbs, non-prescription drugs, or dietary supplements you use. Also tell them if you smoke, drink alcohol, or use illegal drugs. Some items may interact with your medicine. What should I watch for while using this medicine? Visit your doctor or health care professional for regular checks on your progress. Your doctor or health care professional may order blood tests and other tests to see how you are doing. Call your doctor or health care professional if you get a cold or other infection while receiving this medicine. Do not treat yourself. This medicine may decrease your body's ability to fight infection. You should make sure you get enough calcium and vitamin D while you are taking this medicine, unless your doctor tells you not to. Discuss the foods you eat and the vitamins you take with your health care  professional. See your dentist regularly. Brush and floss your teeth as directed. Before you have any dental work done, tell your dentist you are receiving this medicine. Do not become pregnant while taking this medicine or for 5 months after  stopping it. Women should inform their doctor if they wish to become pregnant or think they might be pregnant. There is a potential for serious side effects to an unborn child. Talk to your health care professional or pharmacist for more information. What side effects may I notice from receiving this medicine? Side effects that you should report to your doctor or health care professional as soon as possible: -allergic reactions like skin rash, itching or hives, swelling of the face, lips, or tongue -breathing problems -chest pain -fast, irregular heartbeat -feeling faint or lightheaded, falls -fever, chills, or any other sign of infection -muscle spasms, tightening, or twitches -numbness or tingling -skin blisters or bumps, or is dry, peels, or red -slow healing or unexplained pain in the mouth or jaw -unusual bleeding or bruising Side effects that usually do not require medical attention (Report these to your doctor or health care professional if they continue or are bothersome.): -muscle pain -stomach upset, gas This list may not describe all possible side effects. Call your doctor for medical advice about side effects. You may report side effects to FDA at 1-800-FDA-1088. Where should I keep my medicine? This medicine is only given in a clinic, doctor's office, or other health care setting and will not be stored at home. NOTE: This sheet is a summary. It may not cover all possible information. If you have questions about this medicine, talk to your doctor, pharmacist, or health care provider.  2015, [employer]. (2011-07-14 12:37:47)

## 2014-10-04 DIAGNOSIS — Z008 Encounter for other general examination: Secondary | ICD-10-CM | POA: Diagnosis not present

## 2014-10-04 DIAGNOSIS — E785 Hyperlipidemia, unspecified: Secondary | ICD-10-CM | POA: Diagnosis not present

## 2014-10-04 DIAGNOSIS — E569 Vitamin deficiency, unspecified: Secondary | ICD-10-CM | POA: Diagnosis not present

## 2014-10-04 DIAGNOSIS — I10 Essential (primary) hypertension: Secondary | ICD-10-CM | POA: Diagnosis not present

## 2014-10-04 DIAGNOSIS — E559 Vitamin D deficiency, unspecified: Secondary | ICD-10-CM | POA: Diagnosis not present

## 2014-10-10 DIAGNOSIS — M179 Osteoarthritis of knee, unspecified: Secondary | ICD-10-CM | POA: Diagnosis not present

## 2014-10-10 DIAGNOSIS — E785 Hyperlipidemia, unspecified: Secondary | ICD-10-CM | POA: Diagnosis not present

## 2014-10-10 DIAGNOSIS — R140 Abdominal distension (gaseous): Secondary | ICD-10-CM | POA: Diagnosis not present

## 2014-10-10 DIAGNOSIS — H9190 Unspecified hearing loss, unspecified ear: Secondary | ICD-10-CM | POA: Diagnosis not present

## 2014-10-10 DIAGNOSIS — N183 Chronic kidney disease, stage 3 (moderate): Secondary | ICD-10-CM | POA: Diagnosis not present

## 2014-10-10 DIAGNOSIS — E781 Pure hyperglyceridemia: Secondary | ICD-10-CM | POA: Diagnosis not present

## 2014-10-10 DIAGNOSIS — I10 Essential (primary) hypertension: Secondary | ICD-10-CM | POA: Diagnosis not present

## 2014-10-10 DIAGNOSIS — I454 Nonspecific intraventricular block: Secondary | ICD-10-CM | POA: Diagnosis not present

## 2014-10-10 DIAGNOSIS — Z0000 Encounter for general adult medical examination without abnormal findings: Secondary | ICD-10-CM | POA: Diagnosis not present

## 2014-10-10 DIAGNOSIS — R05 Cough: Secondary | ICD-10-CM | POA: Diagnosis not present

## 2014-10-10 DIAGNOSIS — Z23 Encounter for immunization: Secondary | ICD-10-CM | POA: Diagnosis not present

## 2014-10-25 DIAGNOSIS — Z1212 Encounter for screening for malignant neoplasm of rectum: Secondary | ICD-10-CM | POA: Diagnosis not present

## 2015-03-05 ENCOUNTER — Encounter (HOSPITAL_COMMUNITY): Payer: Self-pay

## 2015-03-05 ENCOUNTER — Ambulatory Visit (HOSPITAL_COMMUNITY)
Admission: RE | Admit: 2015-03-05 | Discharge: 2015-03-05 | Disposition: A | Discharge status: Home / Self Care | Payer: Medicare Other | Source: Ambulatory Visit | Attending: Internal Medicine | Admitting: Internal Medicine

## 2015-03-05 DIAGNOSIS — M810 Age-related osteoporosis without current pathological fracture: Secondary | ICD-10-CM | POA: Insufficient documentation

## 2015-03-05 MED ORDER — DENOSUMAB 60 MG/ML ~~LOC~~ SOLN
60.0000 mg | Freq: Once | SUBCUTANEOUS | Status: AC
  Administered 2015-03-05: 60 mg via SUBCUTANEOUS
  Filled 2015-03-05: qty 1

## 2015-03-05 NOTE — Discharge Instructions (Signed)
PROLIA °Denosumab injection °What is this medicine? °DENOSUMAB (den oh sue mab) slows bone breakdown. Prolia is used to treat osteoporosis in women after menopause and in men. Xgeva is used to prevent bone fractures and other bone problems caused by cancer bone metastases. Xgeva is also used to treat giant cell tumor of the bone. °This medicine may be used for other purposes; ask your health care provider or pharmacist if you have questions. °What should I tell my health care provider before I take this medicine? °They need to know if you have any of these conditions: °-dental disease °-eczema °-infection or history of infections °-kidney disease or on dialysis °-low blood calcium or vitamin D °-malabsorption syndrome °-scheduled to have surgery or tooth extraction °-taking medicine that contains denosumab °-thyroid or parathyroid disease °-an unusual reaction to denosumab, other medicines, foods, dyes, or preservatives °-pregnant or trying to get pregnant °-breast-feeding °How should I use this medicine? °This medicine is for injection under the skin. It is given by a health care professional in a hospital or clinic setting. °If you are getting Prolia, a special MedGuide will be given to you by the pharmacist with each prescription and refill. Be sure to read this information carefully each time. °For Prolia, talk to your pediatrician regarding the use of this medicine in children. Special care may be needed. For Xgeva, talk to your pediatrician regarding the use of this medicine in children. While this drug may be prescribed for children as young as 13 years for selected conditions, precautions do apply. °Overdosage: If you think you have taken too much of this medicine contact a poison control center or emergency room at once. °NOTE: This medicine is only for you. Do not share this medicine with others. °What if I miss a dose? °It is important not to miss your dose. Call your doctor or health care professional if  you are unable to keep an appointment. °What may interact with this medicine? °Do not take this medicine with any of the following medications: °-other medicines containing denosumab °This medicine may also interact with the following medications: °-medicines that suppress the immune system °-medicines that treat cancer °-steroid medicines like prednisone or cortisone °This list may not describe all possible interactions. Give your health care provider a list of all the medicines, herbs, non-prescription drugs, or dietary supplements you use. Also tell them if you smoke, drink alcohol, or use illegal drugs. Some items may interact with your medicine. °What should I watch for while using this medicine? °Visit your doctor or health care professional for regular checks on your progress. Your doctor or health care professional may order blood tests and other tests to see how you are doing. °Call your doctor or health care professional if you get a cold or other infection while receiving this medicine. Do not treat yourself. This medicine may decrease your body's ability to fight infection. °You should make sure you get enough calcium and vitamin D while you are taking this medicine, unless your doctor tells you not to. Discuss the foods you eat and the vitamins you take with your health care professional. °See your dentist regularly. Brush and floss your teeth as directed. Before you have any dental work done, tell your dentist you are receiving this medicine. °Do not become pregnant while taking this medicine or for 5 months after stopping it. Women should inform their doctor if they wish to become pregnant or think they might be pregnant. There is a potential for serious side   effects to an unborn child. Talk to your health care professional or pharmacist for more information. °What side effects may I notice from receiving this medicine? °Side effects that you should report to your doctor or health care professional as  soon as possible: °-allergic reactions like skin rash, itching or hives, swelling of the face, lips, or tongue °-breathing problems °-chest pain °-fast, irregular heartbeat °-feeling faint or lightheaded, falls °-fever, chills, or any other sign of infection °-muscle spasms, tightening, or twitches °-numbness or tingling °-skin blisters or bumps, or is dry, peels, or red °-slow healing or unexplained pain in the mouth or jaw °-unusual bleeding or bruising °Side effects that usually do not require medical attention (Report these to your doctor or health care professional if they continue or are bothersome.): °-muscle pain °-stomach upset, gas °This list may not describe all possible side effects. Call your doctor for medical advice about side effects. You may report side effects to FDA at 1-800-FDA-1088. °Where should I keep my medicine? °This medicine is only given in a clinic, doctor's office, or other health care setting and will not be stored at home. °NOTE: This sheet is a summary. It may not cover all possible information. If you have questions about this medicine, talk to your doctor, pharmacist, or health care provider. °  °© 2016, [employer]. (2011-07-14 12:37:47) °Osteoporosis °Osteoporosis is the thinning and loss of density in the bones. Osteoporosis makes the bones more brittle, fragile, and likely to break (fracture). Over time, osteoporosis can cause the bones to become so weak that they fracture after a simple fall. The bones most likely to fracture are the bones in the hip, wrist, and spine. °CAUSES  °The exact cause is not known. °RISK FACTORS °Anyone can develop osteoporosis. You may be at greater risk if you have a family history of the condition or have poor nutrition. You may also have a higher risk if you are:  °· Female.   °· 50 years old or older. °· A smoker. °· Not physically active.   °· White or Asian. °· Slender. °SIGNS AND SYMPTOMS  °A fracture might be the first sign of the  disease, especially if it results from a fall or injury that would not usually cause a bone to break. Other signs and symptoms include:  °· Low back and neck pain. °· Stooped posture. °· Height loss. °DIAGNOSIS  °To make a diagnosis, your health care provider may: °· Take a medical history. °· Perform a physical exam. °· Order tests, such as: °¨ A bone mineral density test. °¨ A dual-energy X-ray absorptiometry test. °TREATMENT  °The goal of osteoporosis treatment is to strengthen your bones to reduce your risk of a fracture. Treatment may involve: °· Making lifestyle changes, such as: °¨ Eating a diet rich in calcium. °¨ Doing weight-bearing and muscle-strengthening exercises. °¨ Stopping tobacco use. °¨ Limiting alcohol intake. °· Taking medicine to slow the process of bone loss or to increase bone density. °· Monitoring your levels of calcium and vitamin D. °HOME CARE INSTRUCTIONS °· Include calcium and vitamin D in your diet. Calcium is important for bone health, and vitamin D helps the body absorb calcium. °· Perform weight-bearing and muscle-strengthening exercises as directed by your health care provider. °· Do not use any tobacco products, including cigarettes, chewing tobacco, and electronic cigarettes. If you need help quitting, ask your health care provider. °· Limit your alcohol intake. °· Take medicines only as directed by your health care provider. °· Keep all   follow-up visits as directed by your health care provider. This is important. °· Take precautions at home to lower your risk of falling, such as: °¨ Keeping rooms well lit and clutter free. °¨ Installing safety rails on stairs. °¨ Using rubber mats in the bathroom and other areas that are often wet or slippery. °SEEK IMMEDIATE MEDICAL CARE IF:  °You fall or injure yourself.  °  °This information is not intended to replace advice given to you by your health care provider. Make sure you discuss any questions you have with your health care  provider. °  °Document Released: 10/23/2004 Document Revised: 02/03/2014 Document Reviewed: 06/23/2013 °Elsevier Interactive Patient Education ©2016 [employer] ° ° °

## 2015-04-10 DIAGNOSIS — M810 Age-related osteoporosis without current pathological fracture: Secondary | ICD-10-CM | POA: Diagnosis not present

## 2015-04-10 DIAGNOSIS — Z1389 Encounter for screening for other disorder: Secondary | ICD-10-CM | POA: Diagnosis not present

## 2015-04-10 DIAGNOSIS — Z6821 Body mass index (BMI) 21.0-21.9, adult: Secondary | ICD-10-CM | POA: Diagnosis not present

## 2015-04-10 DIAGNOSIS — M179 Osteoarthritis of knee, unspecified: Secondary | ICD-10-CM | POA: Diagnosis not present

## 2015-04-10 DIAGNOSIS — D692 Other nonthrombocytopenic purpura: Secondary | ICD-10-CM | POA: Diagnosis not present

## 2015-04-10 DIAGNOSIS — N183 Chronic kidney disease, stage 3 (moderate): Secondary | ICD-10-CM | POA: Diagnosis not present

## 2015-04-10 DIAGNOSIS — I129 Hypertensive chronic kidney disease with stage 1 through stage 4 chronic kidney disease, or unspecified chronic kidney disease: Secondary | ICD-10-CM | POA: Diagnosis not present

## 2015-05-14 DIAGNOSIS — H353132 Nonexudative age-related macular degeneration, bilateral, intermediate dry stage: Secondary | ICD-10-CM | POA: Diagnosis not present

## 2015-05-14 DIAGNOSIS — H02051 Trichiasis without entropian right upper eyelid: Secondary | ICD-10-CM | POA: Diagnosis not present

## 2015-05-14 DIAGNOSIS — H5213 Myopia, bilateral: Secondary | ICD-10-CM | POA: Diagnosis not present

## 2015-05-14 DIAGNOSIS — H43813 Vitreous degeneration, bilateral: Secondary | ICD-10-CM | POA: Diagnosis not present

## 2015-05-22 ENCOUNTER — Other Ambulatory Visit: Payer: Self-pay

## 2015-05-22 DIAGNOSIS — Z1231 Encounter for screening mammogram for malignant neoplasm of breast: Secondary | ICD-10-CM

## 2015-06-14 DIAGNOSIS — L820 Inflamed seborrheic keratosis: Secondary | ICD-10-CM | POA: Diagnosis not present

## 2015-06-14 DIAGNOSIS — L821 Other seborrheic keratosis: Secondary | ICD-10-CM | POA: Diagnosis not present

## 2015-06-22 ENCOUNTER — Ambulatory Visit: Payer: Self-pay

## 2015-07-05 ENCOUNTER — Ambulatory Visit
Admission: RE | Admit: 2015-07-05 | Discharge: 2015-07-05 | Disposition: A | Discharge status: Home / Self Care | Payer: Medicare Other | Source: Ambulatory Visit

## 2015-07-05 ENCOUNTER — Other Ambulatory Visit: Payer: Self-pay | Admitting: Internal Medicine

## 2015-07-05 DIAGNOSIS — Z1231 Encounter for screening mammogram for malignant neoplasm of breast: Secondary | ICD-10-CM

## 2015-07-23 DIAGNOSIS — R1084 Generalized abdominal pain: Secondary | ICD-10-CM | POA: Diagnosis not present

## 2015-07-23 DIAGNOSIS — L309 Dermatitis, unspecified: Secondary | ICD-10-CM | POA: Diagnosis not present

## 2015-07-23 DIAGNOSIS — I10 Essential (primary) hypertension: Secondary | ICD-10-CM | POA: Diagnosis not present

## 2015-07-23 DIAGNOSIS — K5900 Constipation, unspecified: Secondary | ICD-10-CM | POA: Diagnosis not present

## 2015-07-23 DIAGNOSIS — R110 Nausea: Secondary | ICD-10-CM | POA: Diagnosis not present

## 2015-07-23 DIAGNOSIS — Z6821 Body mass index (BMI) 21.0-21.9, adult: Secondary | ICD-10-CM | POA: Diagnosis not present

## 2015-07-23 DIAGNOSIS — R140 Abdominal distension (gaseous): Secondary | ICD-10-CM | POA: Diagnosis not present

## 2015-09-04 ENCOUNTER — Encounter (HOSPITAL_COMMUNITY): Payer: Self-pay

## 2015-09-04 ENCOUNTER — Ambulatory Visit (HOSPITAL_COMMUNITY)
Admission: RE | Admit: 2015-09-04 | Discharge: 2015-09-04 | Disposition: A | Discharge status: Home / Self Care | Payer: Medicare Other | Source: Ambulatory Visit | Attending: Internal Medicine | Admitting: Internal Medicine

## 2015-09-04 DIAGNOSIS — M810 Age-related osteoporosis without current pathological fracture: Secondary | ICD-10-CM | POA: Insufficient documentation

## 2015-09-04 MED ORDER — DENOSUMAB 60 MG/ML ~~LOC~~ SOLN
60.0000 mg | Freq: Once | SUBCUTANEOUS | Status: AC
  Administered 2015-09-04: 60 mg via SUBCUTANEOUS
  Filled 2015-09-04: qty 1

## 2015-09-04 NOTE — Discharge Instructions (Signed)
Prolia °Denosumab injection °What is this medicine? °DENOSUMAB (den oh sue mab) slows bone breakdown. Prolia is used to treat osteoporosis in women after menopause and in men. Xgeva is used to prevent bone fractures and other bone problems caused by cancer bone metastases. Xgeva is also used to treat giant cell tumor of the bone. °This medicine may be used for other purposes; ask your health care provider or pharmacist if you have questions. °What should I tell my health care provider before I take this medicine? °They need to know if you have any of these conditions: °-dental disease °-eczema °-infection or history of infections °-kidney disease or on dialysis °-low blood calcium or vitamin D °-malabsorption syndrome °-scheduled to have surgery or tooth extraction °-taking medicine that contains denosumab °-thyroid or parathyroid disease °-an unusual reaction to denosumab, other medicines, foods, dyes, or preservatives °-pregnant or trying to get pregnant °-breast-feeding °How should I use this medicine? °This medicine is for injection under the skin. It is given by a health care professional in a hospital or clinic setting. °If you are getting Prolia, a special MedGuide will be given to you by the pharmacist with each prescription and refill. Be sure to read this information carefully each time. °For Prolia, talk to your pediatrician regarding the use of this medicine in children. Special care may be needed. For Xgeva, talk to your pediatrician regarding the use of this medicine in children. While this drug may be prescribed for children as young as 13 years for selected conditions, precautions do apply. °Overdosage: If you think you have taken too much of this medicine contact a poison control center or emergency room at once. °NOTE: This medicine is only for you. Do not share this medicine with others. °What if I miss a dose? °It is important not to miss your dose. Call your doctor or health care professional  if you are unable to keep an appointment. °What may interact with this medicine? °Do not take this medicine with any of the following medications: °-other medicines containing denosumab °This medicine may also interact with the following medications: °-medicines that suppress the immune system °-medicines that treat cancer °-steroid medicines like prednisone or cortisone °This list may not describe all possible interactions. Give your health care provider a list of all the medicines, herbs, non-prescription drugs, or dietary supplements you use. Also tell them if you smoke, drink alcohol, or use illegal drugs. Some items may interact with your medicine. °What should I watch for while using this medicine? °Visit your doctor or health care professional for regular checks on your progress. Your doctor or health care professional may order blood tests and other tests to see how you are doing. °Call your doctor or health care professional if you get a cold or other infection while receiving this medicine. Do not treat yourself. This medicine may decrease your body's ability to fight infection. °You should make sure you get enough calcium and vitamin D while you are taking this medicine, unless your doctor tells you not to. Discuss the foods you eat and the vitamins you take with your health care professional. °See your dentist regularly. Brush and floss your teeth as directed. Before you have any dental work done, tell your dentist you are receiving this medicine. °Do not become pregnant while taking this medicine or for 5 months after stopping it. Women should inform their doctor if they wish to become pregnant or think they might be pregnant. There is a potential for serious side   effects to an unborn child. Talk to your health care professional or pharmacist for more information. What side effects may I notice from receiving this medicine? Side effects that you should report to your doctor or health care professional as  soon as possible: -allergic reactions like skin rash, itching or hives, swelling of the face, lips, or tongue -breathing problems -chest pain -fast, irregular heartbeat -feeling faint or lightheaded, falls -fever, chills, or any other sign of infection -muscle spasms, tightening, or twitches -numbness or tingling -skin blisters or bumps, or is dry, peels, or red -slow healing or unexplained pain in the mouth or jaw -unusual bleeding or bruising Side effects that usually do not require medical attention (Report these to your doctor or health care professional if they continue or are bothersome.): -muscle pain -stomach upset, gas This list may not describe all possible side effects. Call your doctor for medical advice about side effects. You may report side effects to FDA at 1-800-FDA-1088. Where should I keep my medicine? This medicine is only given in a clinic, doctor's office, or other health care setting and will not be stored at home. NOTE: This sheet is a summary. It may not cover all possible information. If you have questions about this medicine, talk to your doctor, pharmacist, or health care provider.    2016, [employer]. (2011-07-14 12:37:47)

## 2015-10-16 DIAGNOSIS — Z0000 Encounter for general adult medical examination without abnormal findings: Secondary | ICD-10-CM | POA: Diagnosis not present

## 2015-10-16 DIAGNOSIS — E559 Vitamin D deficiency, unspecified: Secondary | ICD-10-CM | POA: Diagnosis not present

## 2015-10-16 DIAGNOSIS — I10 Essential (primary) hypertension: Secondary | ICD-10-CM | POA: Diagnosis not present

## 2015-10-16 DIAGNOSIS — E784 Other hyperlipidemia: Secondary | ICD-10-CM | POA: Diagnosis not present

## 2015-10-19 DIAGNOSIS — I129 Hypertensive chronic kidney disease with stage 1 through stage 4 chronic kidney disease, or unspecified chronic kidney disease: Secondary | ICD-10-CM | POA: Diagnosis not present

## 2015-10-19 DIAGNOSIS — I10 Essential (primary) hypertension: Secondary | ICD-10-CM | POA: Diagnosis not present

## 2015-10-19 DIAGNOSIS — H60502 Unspecified acute noninfective otitis externa, left ear: Secondary | ICD-10-CM | POA: Diagnosis not present

## 2015-10-19 DIAGNOSIS — Z6820 Body mass index (BMI) 20.0-20.9, adult: Secondary | ICD-10-CM | POA: Diagnosis not present

## 2015-10-19 DIAGNOSIS — K5909 Other constipation: Secondary | ICD-10-CM | POA: Diagnosis not present

## 2015-10-23 DIAGNOSIS — E784 Other hyperlipidemia: Secondary | ICD-10-CM | POA: Diagnosis not present

## 2015-10-23 DIAGNOSIS — I872 Venous insufficiency (chronic) (peripheral): Secondary | ICD-10-CM | POA: Diagnosis not present

## 2015-10-23 DIAGNOSIS — E7800 Pure hypercholesterolemia, unspecified: Secondary | ICD-10-CM | POA: Diagnosis not present

## 2015-10-23 DIAGNOSIS — I129 Hypertensive chronic kidney disease with stage 1 through stage 4 chronic kidney disease, or unspecified chronic kidney disease: Secondary | ICD-10-CM | POA: Diagnosis not present

## 2015-10-23 DIAGNOSIS — Z0000 Encounter for general adult medical examination without abnormal findings: Secondary | ICD-10-CM | POA: Diagnosis not present

## 2015-10-23 DIAGNOSIS — Z1212 Encounter for screening for malignant neoplasm of rectum: Secondary | ICD-10-CM | POA: Diagnosis not present

## 2015-10-23 DIAGNOSIS — I454 Nonspecific intraventricular block: Secondary | ICD-10-CM | POA: Diagnosis not present

## 2015-10-23 DIAGNOSIS — D692 Other nonthrombocytopenic purpura: Secondary | ICD-10-CM | POA: Diagnosis not present

## 2015-10-23 DIAGNOSIS — N183 Chronic kidney disease, stage 3 (moderate): Secondary | ICD-10-CM | POA: Diagnosis not present

## 2015-10-23 DIAGNOSIS — M810 Age-related osteoporosis without current pathological fracture: Secondary | ICD-10-CM | POA: Diagnosis not present

## 2015-10-23 DIAGNOSIS — Z6820 Body mass index (BMI) 20.0-20.9, adult: Secondary | ICD-10-CM | POA: Diagnosis not present

## 2015-10-23 DIAGNOSIS — H60502 Unspecified acute noninfective otitis externa, left ear: Secondary | ICD-10-CM | POA: Diagnosis not present

## 2015-10-23 DIAGNOSIS — M1711 Unilateral primary osteoarthritis, right knee: Secondary | ICD-10-CM | POA: Diagnosis not present

## 2015-11-08 DIAGNOSIS — Z23 Encounter for immunization: Secondary | ICD-10-CM | POA: Diagnosis not present

## 2016-03-11 ENCOUNTER — Encounter (HOSPITAL_COMMUNITY): Payer: Self-pay

## 2016-03-11 ENCOUNTER — Ambulatory Visit (HOSPITAL_COMMUNITY)
Admission: RE | Admit: 2016-03-11 | Discharge: 2016-03-11 | Disposition: A | Discharge status: Home / Self Care | Payer: Medicare Other | Source: Ambulatory Visit | Attending: Internal Medicine | Admitting: Internal Medicine

## 2016-03-11 DIAGNOSIS — M810 Age-related osteoporosis without current pathological fracture: Secondary | ICD-10-CM | POA: Diagnosis not present

## 2016-03-11 MED ORDER — DENOSUMAB 60 MG/ML ~~LOC~~ SOLN
60.0000 mg | Freq: Once | SUBCUTANEOUS | Status: AC
  Administered 2016-03-11: 60 mg via SUBCUTANEOUS
  Filled 2016-03-11: qty 1

## 2016-03-11 NOTE — Discharge Instructions (Signed)
Denosumab injection What is this medicine? DENOSUMAB (den oh sue mab) slows bone breakdown. Prolia is used to treat osteoporosis in women after menopause and in men. Xgeva is used to prevent bone fractures and other bone problems caused by cancer bone metastases. Xgeva is also used to treat giant cell tumor of the bone. COMMON BRAND NAME(S): Prolia, XGEVA What should I tell my health care provider before I take this medicine? They need to know if you have any of these conditions: -dental disease -eczema -infection or history of infections -kidney disease or on dialysis -low blood calcium or vitamin D -malabsorption syndrome -scheduled to have surgery or tooth extraction -taking medicine that contains denosumab -thyroid or parathyroid disease -an unusual reaction to denosumab, other medicines, foods, dyes, or preservatives -pregnant or trying to get pregnant -breast-feeding How should I use this medicine? This medicine is for injection under the skin. It is given by a health care professional in a hospital or clinic setting. If you are getting Prolia, a special MedGuide will be given to you by the pharmacist with each prescription and refill. Be sure to read this information carefully each time. For Prolia, talk to your pediatrician regarding the use of this medicine in children. Special care may be needed. For Xgeva, talk to your pediatrician regarding the use of this medicine in children. While this drug may be prescribed for children as young as 13 years for selected conditions, precautions do apply. What if I miss a dose? It is important not to miss your dose. Call your doctor or health care professional if you are unable to keep an appointment. What may interact with this medicine? Do not take this medicine with any of the following medications: -other medicines containing denosumab This medicine may also interact with the following medications: -medicines that suppress the immune  system -medicines that treat cancer -steroid medicines like prednisone or cortisone What should I watch for while using this medicine? Visit your doctor or health care professional for regular checks on your progress. Your doctor or health care professional may order blood tests and other tests to see how you are doing. Call your doctor or health care professional if you get a cold or other infection while receiving this medicine. Do not treat yourself. This medicine may decrease your body's ability to fight infection. You should make sure you get enough calcium and vitamin D while you are taking this medicine, unless your doctor tells you not to. Discuss the foods you eat and the vitamins you take with your health care professional. See your dentist regularly. Brush and floss your teeth as directed. Before you have any dental work done, tell your dentist you are receiving this medicine. Do not become pregnant while taking this medicine or for 5 months after stopping it. Women should inform their doctor if they wish to become pregnant or think they might be pregnant. There is a potential for serious side effects to an unborn child. Talk to your health care professional or pharmacist for more information. What side effects may I notice from receiving this medicine? Side effects that you should report to your doctor or health care professional as soon as possible: -allergic reactions like skin rash, itching or hives, swelling of the face, lips, or tongue -breathing problems -chest pain -fast, irregular heartbeat -feeling faint or lightheaded, falls -fever, chills, or any other sign of infection -muscle spasms, tightening, or twitches -numbness or tingling -skin blisters or bumps, or is dry, peels, or red -slow   healing or unexplained pain in the mouth or jaw -unusual bleeding or bruising Side effects that usually do not require medical attention (report to your doctor or health care professional  if they continue or are bothersome): -muscle pain -stomach upset, gas Where should I keep my medicine? This medicine is only given in a clinic, doctor's office, or other health care setting and will not be stored at home.  2017 [employer] Standard (2015-02-15 10:06:55)  

## 2016-04-18 DIAGNOSIS — D692 Other nonthrombocytopenic purpura: Secondary | ICD-10-CM | POA: Diagnosis not present

## 2016-04-18 DIAGNOSIS — I129 Hypertensive chronic kidney disease with stage 1 through stage 4 chronic kidney disease, or unspecified chronic kidney disease: Secondary | ICD-10-CM | POA: Diagnosis not present

## 2016-04-18 DIAGNOSIS — E559 Vitamin D deficiency, unspecified: Secondary | ICD-10-CM | POA: Diagnosis not present

## 2016-04-18 DIAGNOSIS — Z23 Encounter for immunization: Secondary | ICD-10-CM | POA: Diagnosis not present

## 2016-04-18 DIAGNOSIS — M810 Age-related osteoporosis without current pathological fracture: Secondary | ICD-10-CM | POA: Diagnosis not present

## 2016-04-18 DIAGNOSIS — N183 Chronic kidney disease, stage 3 (moderate): Secondary | ICD-10-CM | POA: Diagnosis not present

## 2016-04-18 DIAGNOSIS — Z6820 Body mass index (BMI) 20.0-20.9, adult: Secondary | ICD-10-CM | POA: Diagnosis not present

## 2016-04-18 DIAGNOSIS — I872 Venous insufficiency (chronic) (peripheral): Secondary | ICD-10-CM | POA: Diagnosis not present

## 2016-05-27 ENCOUNTER — Other Ambulatory Visit: Payer: Self-pay | Admitting: Internal Medicine

## 2016-05-27 DIAGNOSIS — Z1231 Encounter for screening mammogram for malignant neoplasm of breast: Secondary | ICD-10-CM

## 2016-06-04 DIAGNOSIS — L570 Actinic keratosis: Secondary | ICD-10-CM | POA: Diagnosis not present

## 2016-06-04 DIAGNOSIS — L821 Other seborrheic keratosis: Secondary | ICD-10-CM | POA: Diagnosis not present

## 2016-06-04 DIAGNOSIS — D1801 Hemangioma of skin and subcutaneous tissue: Secondary | ICD-10-CM | POA: Diagnosis not present

## 2016-07-07 ENCOUNTER — Ambulatory Visit
Admission: RE | Admit: 2016-07-07 | Discharge: 2016-07-07 | Disposition: A | Discharge status: Home / Self Care | Payer: Medicare Other | Source: Ambulatory Visit | Attending: Internal Medicine | Admitting: Internal Medicine

## 2016-07-07 DIAGNOSIS — Z1231 Encounter for screening mammogram for malignant neoplasm of breast: Secondary | ICD-10-CM | POA: Diagnosis not present

## 2016-07-28 DIAGNOSIS — H43813 Vitreous degeneration, bilateral: Secondary | ICD-10-CM | POA: Diagnosis not present

## 2016-07-28 DIAGNOSIS — H52203 Unspecified astigmatism, bilateral: Secondary | ICD-10-CM | POA: Diagnosis not present

## 2016-07-28 DIAGNOSIS — H01001 Unspecified blepharitis right upper eyelid: Secondary | ICD-10-CM | POA: Diagnosis not present

## 2016-07-28 DIAGNOSIS — H353133 Nonexudative age-related macular degeneration, bilateral, advanced atrophic without subfoveal involvement: Secondary | ICD-10-CM | POA: Diagnosis not present

## 2016-09-09 ENCOUNTER — Encounter (HOSPITAL_COMMUNITY): Payer: Self-pay

## 2016-09-09 ENCOUNTER — Ambulatory Visit (HOSPITAL_COMMUNITY)
Admission: RE | Admit: 2016-09-09 | Discharge: 2016-09-09 | Disposition: A | Discharge status: Home / Self Care | Payer: Medicare Other | Source: Ambulatory Visit | Attending: Internal Medicine | Admitting: Internal Medicine

## 2016-09-09 DIAGNOSIS — M810 Age-related osteoporosis without current pathological fracture: Secondary | ICD-10-CM | POA: Diagnosis not present

## 2016-09-09 MED ORDER — DENOSUMAB 60 MG/ML ~~LOC~~ SOLN
60.0000 mg | Freq: Once | SUBCUTANEOUS | Status: AC
  Administered 2016-09-09: 60 mg via SUBCUTANEOUS
  Filled 2016-09-09: qty 1

## 2016-09-09 NOTE — Discharge Instructions (Signed)

## 2016-09-09 NOTE — Progress Notes (Signed)
prolia 60mg  sq given.  D/c instructions on prolia given to pt along with next appointment also.  Pt given short stay phone number also.  Pt takes daily calcium and vitamin D, informed pt to keep taking to make the prolia effective, pt voiced understanding.  Pt has had prolia in the past and did well with it.

## 2016-10-20 DIAGNOSIS — M810 Age-related osteoporosis without current pathological fracture: Secondary | ICD-10-CM | POA: Diagnosis not present

## 2016-10-20 DIAGNOSIS — I10 Essential (primary) hypertension: Secondary | ICD-10-CM | POA: Diagnosis not present

## 2016-10-20 DIAGNOSIS — E784 Other hyperlipidemia: Secondary | ICD-10-CM | POA: Diagnosis not present

## 2016-10-27 DIAGNOSIS — I129 Hypertensive chronic kidney disease with stage 1 through stage 4 chronic kidney disease, or unspecified chronic kidney disease: Secondary | ICD-10-CM | POA: Diagnosis not present

## 2016-10-27 DIAGNOSIS — I454 Nonspecific intraventricular block: Secondary | ICD-10-CM | POA: Diagnosis not present

## 2016-10-27 DIAGNOSIS — Z1389 Encounter for screening for other disorder: Secondary | ICD-10-CM | POA: Diagnosis not present

## 2016-10-27 DIAGNOSIS — Z0000 Encounter for general adult medical examination without abnormal findings: Secondary | ICD-10-CM | POA: Diagnosis not present

## 2016-10-27 DIAGNOSIS — N183 Chronic kidney disease, stage 3 (moderate): Secondary | ICD-10-CM | POA: Diagnosis not present

## 2016-10-27 DIAGNOSIS — E7849 Other hyperlipidemia: Secondary | ICD-10-CM | POA: Diagnosis not present

## 2016-10-27 DIAGNOSIS — M810 Age-related osteoporosis without current pathological fracture: Secondary | ICD-10-CM | POA: Diagnosis not present

## 2016-10-27 DIAGNOSIS — I872 Venous insufficiency (chronic) (peripheral): Secondary | ICD-10-CM | POA: Diagnosis not present

## 2016-10-27 DIAGNOSIS — Z23 Encounter for immunization: Secondary | ICD-10-CM | POA: Diagnosis not present

## 2016-10-27 DIAGNOSIS — E781 Pure hyperglyceridemia: Secondary | ICD-10-CM | POA: Diagnosis not present

## 2016-10-27 DIAGNOSIS — Z6820 Body mass index (BMI) 20.0-20.9, adult: Secondary | ICD-10-CM | POA: Diagnosis not present

## 2016-10-27 DIAGNOSIS — H918X9 Other specified hearing loss, unspecified ear: Secondary | ICD-10-CM | POA: Diagnosis not present

## 2016-10-27 DIAGNOSIS — D692 Other nonthrombocytopenic purpura: Secondary | ICD-10-CM | POA: Diagnosis not present

## 2016-10-29 DIAGNOSIS — Z1212 Encounter for screening for malignant neoplasm of rectum: Secondary | ICD-10-CM | POA: Diagnosis not present

## 2017-02-09 DIAGNOSIS — R0602 Shortness of breath: Secondary | ICD-10-CM | POA: Diagnosis not present

## 2017-02-09 DIAGNOSIS — J181 Lobar pneumonia, unspecified organism: Secondary | ICD-10-CM | POA: Diagnosis not present

## 2017-02-09 DIAGNOSIS — I10 Essential (primary) hypertension: Secondary | ICD-10-CM | POA: Diagnosis not present

## 2017-03-17 ENCOUNTER — Encounter (HOSPITAL_COMMUNITY): Payer: Self-pay

## 2017-03-17 ENCOUNTER — Ambulatory Visit (HOSPITAL_COMMUNITY)
Admission: RE | Admit: 2017-03-17 | Discharge: 2017-03-17 | Disposition: A | Discharge status: Home / Self Care | Payer: Medicare Other | Source: Ambulatory Visit | Attending: Internal Medicine | Admitting: Internal Medicine

## 2017-03-17 DIAGNOSIS — M810 Age-related osteoporosis without current pathological fracture: Secondary | ICD-10-CM | POA: Insufficient documentation

## 2017-03-17 MED ORDER — DENOSUMAB 60 MG/ML ~~LOC~~ SOLN
60.0000 mg | Freq: Once | SUBCUTANEOUS | Status: AC
  Administered 2017-03-17: 60 mg via SUBCUTANEOUS
  Filled 2017-03-17: qty 1

## 2017-03-17 NOTE — Discharge Instructions (Signed)
° ° °Prolia °Denosumab injection °What is this medicine? °DENOSUMAB (den oh sue mab) slows bone breakdown. Prolia is used to treat osteoporosis in women after menopause and in men. Xgeva is used to treat a high calcium level due to cancer and to prevent bone fractures and other bone problems caused by multiple myeloma or cancer bone metastases. Xgeva is also used to treat giant cell tumor of the bone. °This medicine may be used for other purposes; ask your health care provider or pharmacist if you have questions. °COMMON BRAND NAME(S): Prolia, XGEVA °What should I tell my health care provider before I take this medicine? °They need to know if you have any of these conditions: °-dental disease °-having surgery or tooth extraction °-infection °-kidney disease °-low levels of calcium or Vitamin D in the blood °-malnutrition °-on hemodialysis °-skin conditions or sensitivity °-thyroid or parathyroid disease °-an unusual reaction to denosumab, other medicines, foods, dyes, or preservatives °-pregnant or trying to get pregnant °-breast-feeding °How should I use this medicine? °This medicine is for injection under the skin. It is given by a health care professional in a hospital or clinic setting. °If you are getting Prolia, a special MedGuide will be given to you by the pharmacist with each prescription and refill. Be sure to read this information carefully each time. °For Prolia, talk to your pediatrician regarding the use of this medicine in children. Special care may be needed. For Xgeva, talk to your pediatrician regarding the use of this medicine in children. While this drug may be prescribed for children as young as 13 years for selected conditions, precautions do apply. °Overdosage: If you think you have taken too much of this medicine contact a poison control center or emergency room at once. °NOTE: This medicine is only for you. Do not share this medicine with others. °What if I miss a dose? °It is important not  to miss your dose. Call your doctor or health care professional if you are unable to keep an appointment. °What may interact with this medicine? °Do not take this medicine with any of the following medications: °-other medicines containing denosumab °This medicine may also interact with the following medications: °-medicines that lower your chance of fighting infection °-steroid medicines like prednisone or cortisone °This list may not describe all possible interactions. Give your health care provider a list of all the medicines, herbs, non-prescription drugs, or dietary supplements you use. Also tell them if you smoke, drink alcohol, or use illegal drugs. Some items may interact with your medicine. °What should I watch for while using this medicine? °Visit your doctor or health care professional for regular checks on your progress. Your doctor or health care professional may order blood tests and other tests to see how you are doing. °Call your doctor or health care professional for advice if you get a fever, chills or sore throat, or other symptoms of a cold or flu. Do not treat yourself. This drug may decrease your body's ability to fight infection. Try to avoid being around people who are sick. °You should make sure you get enough calcium and vitamin D while you are taking this medicine, unless your doctor tells you not to. Discuss the foods you eat and the vitamins you take with your health care professional. °See your dentist regularly. Brush and floss your teeth as directed. Before you have any dental work done, tell your dentist you are receiving this medicine. °Do not become pregnant while taking this medicine or for 5   months after stopping it. Talk with your doctor or health care professional about your birth control options while taking this medicine. Women should inform their doctor if they wish to become pregnant or think they might be pregnant. There is a potential for serious side effects to an unborn  child. Talk to your health care professional or pharmacist for more information. °What side effects may I notice from receiving this medicine? °Side effects that you should report to your doctor or health care professional as soon as possible: °-allergic reactions like skin rash, itching or hives, swelling of the face, lips, or tongue °-bone pain °-breathing problems °-dizziness °-jaw pain, especially after dental work °-redness, blistering, peeling of the skin °-signs and symptoms of infection like fever or chills; cough; sore throat; pain or trouble passing urine °-signs of low calcium like fast heartbeat, muscle cramps or muscle pain; pain, tingling, numbness in the hands or feet; seizures °-unusual bleeding or bruising °-unusually weak or tired °Side effects that usually do not require medical attention (report to your doctor or health care professional if they continue or are bothersome): °-constipation °-diarrhea °-headache °-joint pain °-loss of appetite °-muscle pain °-runny nose °-tiredness °-upset stomach °This list may not describe all possible side effects. Call your doctor for medical advice about side effects. You may report side effects to FDA at 1-800-FDA-1088. °Where should I keep my medicine? °This medicine is only given in a clinic, doctor's office, or other health care setting and will not be stored at home. °NOTE: This sheet is a summary. It may not cover all possible information. If you have questions about this medicine, talk to your doctor, pharmacist, or health care provider. °© 2018 [employer] (2016-02-05 19:17:21) ° °

## 2017-04-03 DIAGNOSIS — J181 Lobar pneumonia, unspecified organism: Secondary | ICD-10-CM | POA: Diagnosis not present

## 2017-04-10 DIAGNOSIS — Z6820 Body mass index (BMI) 20.0-20.9, adult: Secondary | ICD-10-CM | POA: Diagnosis not present

## 2017-04-10 DIAGNOSIS — R05 Cough: Secondary | ICD-10-CM | POA: Diagnosis not present

## 2017-04-10 DIAGNOSIS — R0602 Shortness of breath: Secondary | ICD-10-CM | POA: Diagnosis not present

## 2017-04-30 DIAGNOSIS — N183 Chronic kidney disease, stage 3 (moderate): Secondary | ICD-10-CM | POA: Diagnosis not present

## 2017-04-30 DIAGNOSIS — Z6820 Body mass index (BMI) 20.0-20.9, adult: Secondary | ICD-10-CM | POA: Diagnosis not present

## 2017-04-30 DIAGNOSIS — M810 Age-related osteoporosis without current pathological fracture: Secondary | ICD-10-CM | POA: Diagnosis not present

## 2017-04-30 DIAGNOSIS — R05 Cough: Secondary | ICD-10-CM | POA: Diagnosis not present

## 2017-04-30 DIAGNOSIS — E46 Unspecified protein-calorie malnutrition: Secondary | ICD-10-CM | POA: Diagnosis not present

## 2017-04-30 DIAGNOSIS — D692 Other nonthrombocytopenic purpura: Secondary | ICD-10-CM | POA: Diagnosis not present

## 2017-04-30 DIAGNOSIS — I872 Venous insufficiency (chronic) (peripheral): Secondary | ICD-10-CM | POA: Diagnosis not present

## 2017-04-30 DIAGNOSIS — I129 Hypertensive chronic kidney disease with stage 1 through stage 4 chronic kidney disease, or unspecified chronic kidney disease: Secondary | ICD-10-CM | POA: Diagnosis not present

## 2017-06-08 ENCOUNTER — Other Ambulatory Visit: Payer: Self-pay | Admitting: Internal Medicine

## 2017-06-08 DIAGNOSIS — Z1231 Encounter for screening mammogram for malignant neoplasm of breast: Secondary | ICD-10-CM

## 2017-06-25 ENCOUNTER — Other Ambulatory Visit: Payer: Self-pay | Admitting: Internal Medicine

## 2017-06-25 DIAGNOSIS — R0600 Dyspnea, unspecified: Secondary | ICD-10-CM

## 2017-06-25 DIAGNOSIS — R0602 Shortness of breath: Secondary | ICD-10-CM

## 2017-06-25 DIAGNOSIS — R059 Cough, unspecified: Secondary | ICD-10-CM

## 2017-06-25 DIAGNOSIS — R05 Cough: Secondary | ICD-10-CM

## 2017-06-26 DIAGNOSIS — J9811 Atelectasis: Secondary | ICD-10-CM | POA: Diagnosis not present

## 2017-06-29 DIAGNOSIS — R634 Abnormal weight loss: Secondary | ICD-10-CM | POA: Diagnosis not present

## 2017-06-29 DIAGNOSIS — R05 Cough: Secondary | ICD-10-CM | POA: Diagnosis not present

## 2017-06-29 DIAGNOSIS — R0602 Shortness of breath: Secondary | ICD-10-CM | POA: Diagnosis not present

## 2017-07-01 ENCOUNTER — Encounter: Payer: Self-pay | Admitting: Internal Medicine

## 2017-07-01 ENCOUNTER — Ambulatory Visit (INDEPENDENT_AMBULATORY_CARE_PROVIDER_SITE_OTHER): Payer: Medicare Other | Admitting: Internal Medicine

## 2017-07-01 ENCOUNTER — Other Ambulatory Visit (INDEPENDENT_AMBULATORY_CARE_PROVIDER_SITE_OTHER): Payer: Medicare Other

## 2017-07-01 DIAGNOSIS — R05 Cough: Secondary | ICD-10-CM

## 2017-07-01 DIAGNOSIS — J479 Bronchiectasis, uncomplicated: Secondary | ICD-10-CM

## 2017-07-01 DIAGNOSIS — R053 Chronic cough: Secondary | ICD-10-CM

## 2017-07-01 DIAGNOSIS — D729 Disorder of white blood cells, unspecified: Secondary | ICD-10-CM

## 2017-07-01 LAB — CBC WITH DIFFERENTIAL/PLATELET
BASOS ABS: 0.1 10*3/uL (ref 0.0–0.1)
Basophils Relative: 0.5 % (ref 0.0–3.0)
EOS ABS: 0 10*3/uL (ref 0.0–0.7)
Eosinophils Relative: 0.4 % (ref 0.0–5.0)
HEMATOCRIT: 38.5 % (ref 36.0–46.0)
HEMOGLOBIN: 12.9 g/dL (ref 12.0–15.0)
LYMPHS PCT: 54.3 % — AB (ref 12.0–46.0)
Lymphs Abs: 6.2 10*3/uL — ABNORMAL HIGH (ref 0.7–4.0)
MCHC: 33.6 g/dL (ref 30.0–36.0)
MCV: 98.2 fl (ref 78.0–100.0)
MONOS PCT: 3.3 % (ref 3.0–12.0)
Monocytes Absolute: 0.4 10*3/uL (ref 0.1–1.0)
Neutro Abs: 4.7 10*3/uL (ref 1.4–7.7)
Neutrophils Relative %: 41.5 % — ABNORMAL LOW (ref 43.0–77.0)
Platelets: 244 10*3/uL (ref 150.0–400.0)
RBC: 3.92 Mil/uL (ref 3.87–5.11)
RDW: 13.4 % (ref 11.5–15.5)
WBC: 11.4 10*3/uL — AB (ref 4.0–10.5)

## 2017-07-01 MED ORDER — FLUTTER DEVI: 0 refills | Status: AC

## 2017-07-01 MED ORDER — PREDNISONE 10 MG PO TABS: ORAL_TABLET | ORAL | 0 refills | Status: DC

## 2017-07-01 MED ORDER — AMOXICILLIN-POT CLAVULANATE 875-125 MG PO TABS: 1.0000 | ORAL_TABLET | Freq: Two times a day (BID) | ORAL | 0 refills | Status: AC

## 2017-07-01 NOTE — Progress Notes (Signed)
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 Consult    cough x 6 months no improvement, discolored mucous, 2 rounds of antibiotics. Chest CT 5/30  started with a head cold before xmas 2018  "just like everybody else" and some better after abx then worse p several weeks p finished abx and about the same since despite additional coursed abx though does not know names  Mucus was mostly clear occ yellow min volume and no change off  abx x several months  Worse at hs then resolves s noct or am distubance or flare    NO obvious day to day or daytime variability or assoc truly excess sputum or mucus plugs or hemoptysis or cp or chest tightness, subjective wheeze or overt sinus or hb symptoms. No unusual exposure hx or h/o childhood pna/ asthma or knowledge of premature birth.   . Also denies any obvious fluctuation of symptoms with weather or environmental changes or other aggravating or alleviating factors except as outlined above   Current Allergies, Complete Past Medical History, Past Surgical History, Family History, and Social History were reviewed in [employer] record.  ROS  The following are not active complaints unless bolded Hoarseness, sore throat, dysphagia, dental problems, itching, sneezing,  nasal congestion or discharge of excess mucus or purulent secretions, ear ache,   fever, chills, sweats, unintended wt loss or wt gain, classically pleuritic or exertional cp,  orthopnea pnd or leg swelling, presyncope, palpitations, abdominal pain, anorexia, nausea, vomiting, diarrhea  or change in bowel habits or change in bladder habits,  change in stools or change in urine, dysuria, hematuria,  rash, arthralgias, visual complaints, headache, numbness, weakness or ataxia or problems with walking or coordination,  change in mood/affect or memory.        Current Meds  Medication Sig  . amLODipine (NORVASC) 10 MG tablet Take 10 mg by mouth daily.  Marland Kitchen aspirin 81 MG tablet Take 81 mg by mouth daily.  Marland Kitchen atorvastatin (LIPITOR) 20 MG tablet Take 20 mg by mouth daily.  . Calcium Carbonate-Vitamin D (CALTRATE 600+D) 600-400 MG-UNIT per tablet Take 1 tablet by mouth daily.  . cholecalciferol (VITAMIN D) 1000 UNITS tablet Take 1,000 Units by mouth daily.  Marland Kitchen denosumab (PROLIA) 60 MG/ML SOLN injection Inject 60 mg into the skin every 6 (six) months. Given in May and Nov. - Administer in upper arm, thigh, or abdomen  . Multiple Vitamin (MULTIVITAMIN WITH MINERALS) TABS Take 1 tablet by mouth daily.  . Multiple Vitamins-Minerals (PRESERVISION AREDS 2) CAPS Take by mouth.            Review of Systems     Objective:   Physical Exam    pleasant amb wf nad   Wt Readings from Last 3 Encounters:  07/01/17 113 lb 12.8 oz (51.6 kg)  03/17/17 118 lb 9.6 oz (53.8 kg)  09/04/15 124 lb 3.2 oz (56.3 kg)     Vital signs reviewed - Note on arrival 02 sats  93% on RA      HEENT: nl dentition, turbinates bilaterally, and  oropharynx. Nl external ear canals without cough reflex   NECK :  without JVD/Nodes/TM/ nl carotid upstrokes bilaterally   LUNGS: no acc muscle use,  Kyphotic chest wall with cough / rhonchi on expiration    CV:  RRR  no s3 or murmur or increase in P2, and no edema   ABD:  soft and nontender with nl inspiratory excursion in the supine position. No bruits or organomegaly appreciated, bowel sounds nl  MS:  Nl gait/ ext warm without deformities, calf tenderness, cyanosis or clubbing No obvious joint restrictions   SKIN: warm and dry without lesions    NEURO:  alert, approp, nl sensorium with  no motor or  cerebellar deficits apparent.      I personally reviewed   radiology impression as follows:   Chest CT  W/o contrast 06/26/17     Mild scattered atelectasis, most pronounced in the lingula. Given the suggestion of of minimal right middle lobe bronchiectasis, this raises possibility of chronic tuberculosis microbacterial infection.    Lab Results  Component Value Date   WBC 11.4 (H) 07/01/2017   HGB 12.9 07/01/2017   HCT 38.5 07/01/2017   MCV 98.2 07/01/2017   PLT 244.0 07/01/2017       Smear  C/w lymphocytosis with atypical lymphocytes                          07/01/2017       EOS                                                               0    07/01/2017    ESR                                                                     8                                       06/29/17 Quant TB                                                            Pending                             06/29/17   Assessment:

## 2017-07-01 NOTE — Patient Instructions (Addendum)
Augmentin 875 mg take one pill twice daily  X 10 days - take at breakfast and supper with large glass of water.  It would help reduce the usual side effects (diarrhea and yeast infections) if you ate cultured yogurt at lunch.   Prednisone 10 mg take  4 each am x 2 days,   2 each am x 2 days,  1 each am x 2 days and stop    For mucinex dm 1200 mg every 12 hours and use the flutter valve as much as you can   Try prilosec otc 20mg   Take 30-60 min before first meal of the day and Pepcid ac (famotidine) 20 mg one @  bedtime until cough is completely gone for at least a week without the need for cough suppression     GERD (REFLUX)  is an extremely common cause of respiratory symptoms just like yours , many times with no obvious heartburn at all.    It can be treated with medication, but also with lifestyle changes including elevation of the head of your bed (ideally with 6 inch  bed blocks),  Smoking cessation, avoidance of late meals, excessive alcohol, and avoid fatty foods, chocolate, peppermint, colas, red wine, and acidic juices such as orange juice.  NO MINT OR MENTHOL PRODUCTS SO NO COUGH DROPS   USE SUGARLESS CANDY INSTEAD (Jolley ranchers or Stover's or Life Savers) or even ice chips will also do - the key is to swallow to prevent all throat clearing. NO OIL BASED VITAMINS - use powdered substitutes.   Please remember to go to the lab department downstairs in the basement  for your tests - we will call you with the results when they are available.      If not better when you finish your augmentin you will need a CT sinus next - call to schedule

## 2017-07-02 LAB — RESPIRATORY ALLERGY PROFILE REGION II ~~LOC~~
Allergen, Cedar tree, t12: <0.1 kU/L
Allergen, Comm Silver Birch, t9: <0.1 kU/L
Allergen, Cottonwood, t14: <0.1 kU/L
Allergen, D pternoyssinus,d7: <0.1 kU/L
Allergen, Mouse Urine Protein, e78: <0.1 kU/L
Allergen, Mulberry, t76: <0.1 kU/L
Allergen, Oak,t7: <0.1 kU/L
Allergen, P. notatum, m1: <0.1 kU/L
Aspergillus fumigatus, m3: <0.1 kU/L
Bermuda Grass: <0.1 kU/L
CLADOSPORIUM HERBARUM (M2) IGE: <0.1 kU/L
CLASS: 0
CLASS: 0
CLASS: 0
CLASS: 0
CLASS: 0
CLASS: 0
CLASS: 0
CLASS: 0
CLASS: 0
CLASS: 0
CLASS: 0
CLASS: 0
COMMON RAGWEED (SHORT) (W1) IGE: <0.1 kU/L
Cat Dander: <0.1 kU/L
Class: 0
Class: 0
Class: 0
Class: 0
Class: 0
Class: 0
Class: 0
Class: 0
Class: 0
Class: 0
Class: 0
Class: 0
Cockroach: <0.1 kU/L
Dog Dander: <0.1 kU/L
Pecan/Hickory Tree IgE: <0.1 kU/L
Rough Pigweed  IgE: <0.1 kU/L
Sheep Sorrel IgE: <0.1 kU/L
Timothy Grass: <0.1 kU/L

## 2017-07-02 LAB — PATHOLOGIST SMEAR REVIEW

## 2017-07-02 LAB — INTERPRETATION:

## 2017-07-03 ENCOUNTER — Other Ambulatory Visit: Payer: Medicare Other

## 2017-07-08 ENCOUNTER — Encounter: Payer: Self-pay | Admitting: Internal Medicine

## 2017-07-08 ENCOUNTER — Ambulatory Visit: Payer: Medicare Other

## 2017-07-08 DIAGNOSIS — J479 Bronchiectasis, uncomplicated: Secondary | ICD-10-CM | POA: Insufficient documentation

## 2017-07-08 NOTE — Assessment & Plan Note (Signed)
This is an extremely common benign condition in the elderly and does not warrant aggressive eval/ rx at this point unless there is a clinical correlation suggesting unaddressed pulmonary infection (unexplained production of purulent sputum, night sweats, unintended wt loss, doe) or evolution of  obvious changes on plain cxr (as opposed to serial CT, which is way over sensitive to make clinical decisions re intervention and treatment in the elderly, who tend to tolerate both dx and treatment poorly) .   If dx remains in doubt can do BAL with selective lavage of lingula but for now just rx the cough as above   Discussed in detail all the  indications, usual  risks and alternatives  relative to the benefits with patient who agrees to proceed with conservative f/u as outlined     Total time devoted to counseling  > 50 % of initial 60 min office visit:  review case with pt/ discussion of options/alternatives/ personally creating written customized instructions  in presence of pt  then going over those specific  Instructions directly with the pt including how to use all of the meds but in particular covering each new medication in detail and the difference between the maintenance= "automatic" meds and the prns using an action plan format for the latter (If this problem/symptom => do that organization reading Left to right).  Please see AVS from this visit for a full list of these instructions which I personally wrote for this pt and  are unique to this visit.

## 2017-07-08 NOTE — Assessment & Plan Note (Signed)
The most common causes of chronic cough in immunocompetent adults include the following: upper airway cough syndrome (UACS), previously referred to as postnasal drip syndrome (PNDS), which is caused by variety of rhinosinus conditions; (2) asthma; (3) GERD; (4) chronic bronchitis from cigarette smoking or other inhaled environmental irritants; (5) nonasthmatic eosinophilic bronchitis; and (6) bronchiectasis(which we know she has).   These conditions, singly or in combination, have accounted for up to 94% of the causes of chronic cough in prospective studies.   Other conditions have constituted no >6% of the causes in prospective studies These have included bronchogenic carcinoma, chronic interstitial pneumonia, sarcoidosis, left ventricular failure, ACEI-induced cough, and aspiration from a condition associated with pharyngeal dysfunction.    Chronic cough is often simultaneously caused by more than one condition. A single cause has been found from 38 to 82% of the time, multiple causes from 18 to 62%. Multiply caused cough has been the result of three diseases up to 42% of the time.    Although she has known bronchiectasis this is likely unrelated to the cough which is much more typical of Upper airway cough syndrome (previously labeled PNDS),  is so named because it's frequently impossible to sort out how much is  CR/sinusitis with freq throat clearing (which can be related to primary GERD)   vs  causing  secondary (" extra esophageal")  GERD from wide swings in gastric pressure that occur with throat clearing, often  promoting self use of mint and menthol lozenges that reduce the lower esophageal sphincter tone and exacerbate the problem further in a cyclical fashion.   These are the same pts (now being labeled as having "irritable larynx syndrome" by some cough centers) who not infrequently have a history of having failed to tolerate ace inhibitors,  dry powder inhalers or biphosphonates or report  having atypical/extraesophageal reflux symptoms that don't respond to standard doses of PPI  and are easily confused as having aecopd or asthma flares by even experienced allergists/ pulmonologists (myself included).   Will start with augmentin empirically x 10 days then sinus ct if not better and in meantime keep in mind:  Of the three most common causes of  Sub-acute / recurrent or chronic cough, only one (GERD)  can actually contribute to/ trigger  the other two (asthma and post nasal drip syndrome)  and perpetuate the cylce of cough.  While not intuitively obvious, many patients with chronic low grade reflux do not cough until there is a primary insult that disturbs the protective epithelial barrier and exposes sensitive nerve endings.   This is typically viral but can due to PNDS and  either may apply here but the former is the more likely scenario.    The point is that once this occurs, it is difficult to eliminate the cycle  using anything but a maximally effective acid suppression regimen at least in the short run, accompanied by an appropriate diet to address non acid GERD  Until all coughing has resolved for at least a few weeks then taper off

## 2017-07-15 ENCOUNTER — Telehealth: Payer: Self-pay | Admitting: Internal Medicine

## 2017-07-15 DIAGNOSIS — R05 Cough: Secondary | ICD-10-CM

## 2017-07-15 DIAGNOSIS — R059 Cough, unspecified: Secondary | ICD-10-CM

## 2017-07-15 NOTE — Telephone Encounter (Signed)
Yes, go ahead with limited sinus ct dx chronic cough

## 2017-07-15 NOTE — Telephone Encounter (Signed)
Called and spoke to pt.  Pt is requesting that CT sinus be ordered, as her cough has not improved with Augmentin . Pt stated she has had persistent cough for six months- cough is dry but occ produces yellow mucus. Sob is baseline.   CT sinus has been order per below AVS.  Will route to MW as an FYI.   07/01/17 AVS: Augmentin 875 mg take one pill twice daily  X 10 days - take at breakfast and supper with large glass of water.  It would help reduce the usual side effects (diarrhea and yeast infections) if you ate cultured yogurt at lunch.   Prednisone 10 mg take  4 each am x 2 days,   2 each am x 2 days,  1 each am x 2 days and stop    For mucinex dm 1200 mg every 12 hours and use the flutter valve as much as you can   Try prilosec otc 20mg   Take 30-60 min before first meal of the day and Pepcid ac (famotidine) 20 mg one @  bedtime until cough is completely gone for at least a week without the need for cough suppression     GERD (REFLUX)  is an extremely common cause of respiratory symptoms just like yours , many times with no obvious heartburn at all.    It can be treated with medication, but also with lifestyle changes including elevation of the head of your bed (ideally with 6 inch  bed blocks),  Smoking cessation, avoidance of late meals, excessive alcohol, and avoid fatty foods, chocolate, peppermint, colas, red wine, and acidic juices such as orange juice.  NO MINT OR MENTHOL PRODUCTS SO NO COUGH DROPS   USE SUGARLESS CANDY INSTEAD (Jolley ranchers or Stover's or Life Savers) or even ice chips will also do - the key is to swallow to prevent all throat clearing. NO OIL BASED VITAMINS - use powdered substitutes.   Please remember to go to the lab department downstairs in the basement  for your tests - we will call you with the results when they are available.      If not better when you finish your augmentin you will need a CT sinus next - call to schedule         After Visit  Summary (Printed 07/01/2017)

## 2017-07-15 NOTE — Telephone Encounter (Signed)
Spoke with patient-she is aware of recs from MW-has apt on Monday 07-20-17 at 11:45am. Pt aware we will call her with her results once we receive them. Nothing further needed at this time.

## 2017-07-20 ENCOUNTER — Ambulatory Visit (INDEPENDENT_AMBULATORY_CARE_PROVIDER_SITE_OTHER)
Admission: RE | Admit: 2017-07-20 | Discharge: 2017-07-20 | Disposition: A | Discharge status: Home / Self Care | Payer: Medicare Other | Source: Ambulatory Visit | Attending: Internal Medicine | Admitting: Internal Medicine

## 2017-07-20 DIAGNOSIS — R059 Cough, unspecified: Secondary | ICD-10-CM

## 2017-07-20 DIAGNOSIS — R05 Cough: Secondary | ICD-10-CM

## 2017-07-20 NOTE — Progress Notes (Signed)
Spoke with pt and notified of results per Dr. Wert. Pt verbalized understanding and denied any questions. 

## 2017-07-21 ENCOUNTER — Encounter: Payer: Self-pay | Admitting: Pulmonary Disease

## 2017-07-21 ENCOUNTER — Ambulatory Visit (INDEPENDENT_AMBULATORY_CARE_PROVIDER_SITE_OTHER)
Admission: RE | Admit: 2017-07-21 | Discharge: 2017-07-21 | Disposition: A | Discharge status: Home / Self Care | Payer: Medicare Other | Source: Ambulatory Visit | Attending: Pulmonary Disease | Admitting: Pulmonary Disease

## 2017-07-21 ENCOUNTER — Ambulatory Visit (INDEPENDENT_AMBULATORY_CARE_PROVIDER_SITE_OTHER): Payer: Medicare Other | Admitting: Pulmonary Disease

## 2017-07-21 VITALS — BP 132/88 | HR 98 | Ht 65.0 in | Wt 115.4 lb

## 2017-07-21 DIAGNOSIS — J189 Pneumonia, unspecified organism: Secondary | ICD-10-CM | POA: Diagnosis not present

## 2017-07-21 DIAGNOSIS — J479 Bronchiectasis, uncomplicated: Secondary | ICD-10-CM

## 2017-07-21 DIAGNOSIS — R05 Cough: Secondary | ICD-10-CM | POA: Diagnosis not present

## 2017-07-21 DIAGNOSIS — R053 Chronic cough: Secondary | ICD-10-CM

## 2017-07-21 LAB — POCT EXHALED NITRIC OXIDE: FeNO level (ppb): 24

## 2017-07-21 MED ORDER — BENZONATATE 200 MG PO CAPS: 200.0000 mg | ORAL_CAPSULE | Freq: Three times a day (TID) | ORAL | 1 refills | Status: DC | PRN

## 2017-07-21 MED ORDER — BUDESONIDE-FORMOTEROL FUMARATE 80-4.5 MCG/ACT IN AERO: 2.0000 | INHALATION_SPRAY | Freq: Two times a day (BID) | RESPIRATORY_TRACT | 0 refills | Status: DC

## 2017-07-21 MED ORDER — FAMOTIDINE 20 MG PO TABS: 20.0000 mg | ORAL_TABLET | Freq: Two times a day (BID) | ORAL | 0 refills | Status: DC

## 2017-07-21 NOTE — Patient Instructions (Addendum)
Chest x-ray today  Symbicort 80 >>> 2 puffs in the morning right when you wake up, rinse your mouth after using, 12 hours later 2 puffs rinse out your mouth after using >>>Let us know how its going when you are halfway through the samples we can place in order if symptoms are improved >>>You do this daily no matter what  Start daily antihistamine- Zyrtec 10 mg 1 tablet at night Continue Pepcid 20 mg at night  Continue Flutter Valve  Bronchiectasis: This is the medical term which indicates that you have damage, dilated airways making you more susceptible to respiratory infection. Use a flutter valve 10 breaths twice a day or 4 to 5 breaths 4-5 times a day to help clear mucus out Let us know if you have cough with change in mucus color or fevers or chills.  At that point you would need an antibiotic. Maintain a healthy nutritious diet, eating whole foods Take your medications as prescribed   . We believe you have a chronic/cyclical cough that is aggravated by reflux , coughing , and drainage.  . Goal is to not Cough or clear throat.  Marland Kitchen Avoid coughing or clearing throat by using:  o non-mint products/sugarless candy o Water o ice chips o Remember NO MINT PRODUCTS  . Medications to use:  o Mucinex DM 1-2 every 12 hrs or Delsym 2 tsp every 12 hrs f or cough o Tessalon Three times a day  As needed  Cough.  o Zyrtec 10mg  at bedtime    Follow-up with our office in 4 weeks or sooner if symptoms worsen     Please contact the office if your symptoms worsen or you have concerns that you are not improving.   Thank you for choosing  [facility] for your healthcare, and for allowing [place] to partner with you on your healthcare journey. I am thankful to be able to provide care to you today.   [name] FNP-C

## 2017-07-21 NOTE — Assessment & Plan Note (Signed)
Chest x-ray today   Continue Flutter Valve  Bronchiectasis: This is the medical term which indicates that you have damage, dilated airways making you more susceptible to respiratory infection. Use a flutter valve 10 breaths twice a day or 4 to 5 breaths 4-5 times a day to help clear mucus out Let us know if you have cough with change in mucus color or fevers or chills.  At that point you would need an antibiotic. Maintain a healthy nutritious diet, eating whole foods Take your medications as prescribed

## 2017-07-21 NOTE — Assessment & Plan Note (Signed)
Chest x-ray today FeNo today  Spirometry today   Symbicort 80 >>> 2 puffs in the morning right when you wake up, rinse your mouth after using, 12 hours later 2 puffs rinse out your mouth after using >>>Let us know how its going when you are halfway through the samples we can place in order if symptoms are improved >>>You do this daily no matter what  Start daily antihistamine- Zyrtec 10 mg 1 tablet at night Continue Pepcid 20 mg at night  Continue Flutter Valve  Bronchiectasis: This is the medical term which indicates that you have damage, dilated airways making you more susceptible to respiratory infection. Use a flutter valve 10 breaths twice a day or 4 to 5 breaths 4-5 times a day to help clear mucus out Let us know if you have cough with change in mucus color or fevers or chills.  At that point you would need an antibiotic. Maintain a healthy nutritious diet, eating whole foods Take your medications as prescribed   . We believe you have a chronic/cyclical cough that is aggravated by reflux , coughing , and drainage.  . Goal is to not Cough or clear throat.  Marland Kitchen Avoid coughing or clearing throat by using:  o non-mint products/sugarless candy o Water o ice chips o Remember NO MINT PRODUCTS  . Medications to use:  o Mucinex DM 1-2 every 12 hrs or Delsym 2 tsp every 12 hrs f or cough o Tessalon Three times a day  As needed  Cough.  o Zyrtec 10mg  at bedtime

## 2017-07-21 NOTE — Progress Notes (Signed)
Discussed results with patient in office. Nothing further needed at this time.   [name] FNP

## 2017-07-21 NOTE — Progress Notes (Signed)
@Patient  ID: Mary Kim, female    DOB: 08-10-1923, 84 y.o.   MRN: [id number]  Chief Complaint  Patient presents with  . Follow-up    Needs to discuss Sinus CT results. States her cough is not getting better.     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 Consult    cough x 6 months no improvement, discolored mucous, 2 rounds of antibiotics. Chest CT 5/30  started with a head cold before xmas 2018  "just like everybody else" and some better after abx then worse p several weeks p finished abx and about the same since despite additional coursed abx though does not know names  Mucus was mostly clear occ yellow min volume and no change off  abx x several months  Worse at hs then resolves s noct or am distubance or flare      Tests:   Imaging:  07/20/2017-CT maxillofacial-limited without contrast, no significant paraspinal paranasal sinus disease, maxillary sinuses are clear except for minimal mucosal thickening   Cardiac:   Labs:  07/01/2017-CBC with differential-WBC 11.4 07/01/2017-respiratory allergy panel-negative  Micro:   Chart Review:    07/21/17 Acute  Pleasant 84 year old patient seen in office today for continued cough symptoms.  Patient reporting adherence to Pepcid, but is still having dry cough with occasionally productive clear sputum.  Of note patient does have bronchiectasis.  Patient adherent to flutter valve use.  Patient contacted the office and let her know her symptoms have not improved so we ordered a CT sinuses.  CT shows no chronic sinusitis.    No Known Allergies  Immunization History  Administered Date(s) Administered  .  Influenza-Unspecified 10/31/2016    Past Medical History:  Diagnosis Date  . Elevated cholesterol   . Hearing loss   . Hyperlipidemia   . Hypertension   . Osteoporosis     Tobacco History: Social History   Tobacco Use  Smoking Status Never Smoker  Smokeless Tobacco Never Used   Counseling given: Not Answered Continue not smoking   Outpatient Encounter Medications as of 07/21/2017  Medication Sig  . amLODipine (NORVASC) 10 MG tablet Take 10 mg by mouth daily.  Marland Kitchen aspirin 81 MG tablet Take 81 mg by mouth daily.  Marland Kitchen atorvastatin (LIPITOR) 20 MG tablet Take 20 mg by mouth daily.  . Calcium Carbonate-Vitamin D (CALTRATE 600+D) 600-400 MG-UNIT per tablet Take 1 tablet by mouth daily.  . cholecalciferol (VITAMIN D) 1000 UNITS tablet Take 1,000 Units by mouth daily.  Marland Kitchen denosumab (PROLIA) 60 MG/ML SOLN injection Inject 60 mg into the skin every 6 (six) months. Given in May and Nov. - Administer in upper arm, thigh, or abdomen  . Multiple Vitamin (MULTIVITAMIN WITH MINERALS) TABS Take 1 tablet by mouth daily.  . Multiple Vitamins-Minerals (PRESERVISION AREDS 2) CAPS Take by mouth.  . Respiratory Therapy Supplies (FLUTTER) DEVI Use as directed  . [DISCONTINUED] predniSONE (DELTASONE) 10 MG tablet Take  4 each am x 2 days,   2 each am x 2 days,  1 each am x 2 days and stop  . benzonatate (TESSALON) 200 MG capsule Take 1 capsule (200 mg  total) by mouth 3 (three) times daily as needed for cough.  . budesonide-formoterol (SYMBICORT) 80-4.5 MCG/ACT inhaler Inhale 2 puffs into the lungs every 12 (twelve) hours.  . famotidine (PEPCID) 20 MG tablet Take 1 tablet (20 mg total) by mouth 2 (two) times daily.   No facility-administered encounter medications on file as of 07/21/2017.      Review of Systems  Constitutional:  +fatigue  No  weight loss, night sweats,  fevers, chills HEENT:   No headaches,  Difficulty swallowing,  Tooth/dental problems, or  Sore throat, No sneezing, itching, ear ache,  nasal congestion, post nasal drip  CV: No chest pain,  orthopnea, PND, swelling in lower extremities, anasarca, dizziness, palpitations, syncope  GI: No heartburn, indigestion, abdominal pain, nausea, vomiting, diarrhea, change in bowel habits, loss of appetite, bloody stools Resp: +dry cough  No shortness of breath with exertion or at rest.  No excess mucus,  No coughing up of blood.  No change in color of mucus.  No wheezing.  No chest wall deformity Skin: no rash, lesions, no skin changes. GU: no dysuria, change in color of urine, no urgency or frequency.  No flank pain, no hematuria  MS:  No joint pain or swelling.  No decreased range of motion.  No back pain. Psych:  No change in mood or affect. No depression or anxiety.  No memory loss.   Physical Exam  BP 132/88   Pulse 98   Ht 5\' 5"  (1.651 m)   Wt 115 lb 6.4 oz (52.3 kg)   SpO2 95%   BMI 19.20 kg/m    Wt Readings from Last 3 Encounters:  07/21/17 115 lb 6.4 oz (52.3 kg)  07/01/17 113 lb 12.8 oz (51.6 kg)  03/17/17 118 lb 9.6 oz (53.8 kg)    GEN: A/Ox3; pleasant , NAD, frail    HEENT:  [place]/AT,  EACs-clear, TMs-wnl, NOSE-boggy, THROAT- +post nasal drip Sinus- non tender to palpation   NECK:  Supple w/ fair ROM; no JVD;  no lymphadenopathy.    RESP:  Good air movement, occasional cough, Clear  P & A; w/o, wheezes/ rales/ or rhonchi. no accessory muscle use, no dullness to percussion  CARD:  RRR, no m/r/g, no peripheral edema, pulses intact, no cyanosis or clubbing.  GI:   Soft & nt; nml bowel sounds; no organomegaly or masses detected.   Musco: Warm bil, no deformities or joint swelling noted.   Neuro: alert, no focal deficits noted.    Skin: Warm, no lesions or rashes   FeNo - 24 Spirometry in office- concavity and flow volume loops, ratio    Lab Results:  CBC    Component Value Date/Time   WBC 11.4 (H) 07/01/2017 1056   RBC 3.92 07/01/2017 1056   HGB 12.9 07/01/2017 1056   HCT 38.5 07/01/2017 1056    PLT 244.0 07/01/2017 1056   MCV 98.2 07/01/2017 1056   MCH 32.4 12/15/2011 1003   MCHC 33.6 07/01/2017 1056   RDW 13.4 07/01/2017 1056   LYMPHSABS 6.2 (H) 07/01/2017 1056   MONOABS 0.4 07/01/2017 1056   EOSABS 0.0 07/01/2017 1056   BASOSABS 0.1 07/01/2017 1056    BMET    Component Value Date/Time   NA 139 12/15/2011 1003   K 4.4 12/15/2011 1003   CL 103 12/15/2011 1003   CO2 28 12/15/2011 1003   GLUCOSE 100 (H) 12/15/2011 1003   BUN 19 12/15/2011 1003   CREATININE 1.02 12/15/2011 1003   CALCIUM 9.4 12/15/2011  1003   GFRNONAA 48 (L) 12/15/2011 1003   GFRAA 56 (L) 12/15/2011 1003    BNP No results found for: BNP  ProBNP No results found for: PROBNP  Imaging: Dg Chest 2 View  Result Date: 07/21/2017 CLINICAL DATA:  Chronic cough. EXAM: CHEST - 2 VIEW COMPARISON:  12/15/2011 chest radiograph FINDINGS: The cardiomediastinal silhouette is unremarkable. Anterior lower lung atelectasis on the LATERAL view is probably lingular. No airspace disease, pleural effusion, pneumothorax, mass or acute bony abnormality. Mild compression of a few LOWER thoracic vertebra appear remote. IMPRESSION: Anterior lower lung atelectasis on the LATERAL view-likely lingular. No other significant cardiopulmonary disease. Electronically Signed   By: Margarette Canada M.D.   On: 07/21/2017 17:50   [place] Cm  Result Date: 07/20/2017 CLINICAL DATA:  Persistent cough for 6 weeks. EXAM: CT PARANASAL SINUS LIMITED WITHOUT CONTRAST TECHNIQUE: Non-contiguous multidetector CT images of the paranasal sinuses were obtained in a single plane without contrast. COMPARISON:  CT head 05/13/2006. FINDINGS: Due to the patient's age, the patient could not lay prone for single plane scanning. Study was performed in the axial plane, patient supine. The maxillary sinuses are clear except for minimal mucosal thickening. The ethmoid sinuses are clear. The frontal sinuses are clear. Both divisions of the sphenoid sinus are  clear. Nasal septum bows gently LEFT-to-RIGHT anteriorly and RIGHT-to-LEFT posteriorly. There is apparent BILATERAL cataract extraction. Vascular calcification is noted in the carotid siphons. Negative intracranial compartment. IMPRESSION: No significant paranasal sinus disease. Electronically Signed   By: Staci Righter M.D.   On: 07/20/2017 13:59     Assessment & Plan:   Continued work-up for chronic cough.  Chest x-ray will be done today.  Spirometry shows slight obstruction with decreased ratio, concavity and flow volume loops.  Feno today is 24.  We will do a therapeutic trial of Symbicort 80.  We will have patient follow-up with [place] in 4 weeks.  Chronic cough Chest x-ray today FeNo today  Spirometry today   Symbicort 80 >>> 2 puffs in the morning right when you wake up, rinse your mouth after using, 12 hours later 2 puffs rinse out your mouth after using >>>Let us know how its going when you are halfway through the samples we can place in order if symptoms are improved >>>You do this daily no matter what  Start daily antihistamine- Zyrtec 10 mg 1 tablet at night Continue Pepcid 20 mg at night  Continue Flutter Valve  Bronchiectasis: This is the medical term which indicates that you have damage, dilated airways making you more susceptible to respiratory infection. Use a flutter valve 10 breaths twice a day or 4 to 5 breaths 4-5 times a day to help clear mucus out Let us know if you have cough with change in mucus color or fevers or chills.  At that point you would need an antibiotic. Maintain a healthy nutritious diet, eating whole foods Take your medications as prescribed   . We believe you have a chronic/cyclical cough that is aggravated by reflux , coughing , and drainage.  . Goal is to not Cough or clear throat.  Marland Kitchen Avoid coughing or clearing throat by using:  o non-mint products/sugarless candy o Water o ice chips o Remember NO MINT PRODUCTS  . Medications to use:   o Mucinex DM 1-2 every 12 hrs or Delsym 2 tsp every 12 hrs f or cough o Tessalon Three times a day  As needed  Cough.  o Zyrtec 10mg  at  bedtime   Bronchiectasis without complication ([place]) Chest x-ray today   Continue Flutter Valve  Bronchiectasis: This is the medical term which indicates that you have damage, dilated airways making you more susceptible to respiratory infection. Use a flutter valve 10 breaths twice a day or 4 to 5 breaths 4-5 times a day to help clear mucus out Let us know if you have cough with change in mucus color or fevers or chills.  At that point you would need an antibiotic. Maintain a healthy nutritious diet, eating whole foods Take your medications as prescribed       Lauraine Rinne, NP 07/21/2017

## 2017-07-22 NOTE — Progress Notes (Signed)
Chart and office note reviewed in detail  > agree with a/p as outlined    

## 2017-07-29 MED ORDER — DOXYCYCLINE HYCLATE 100 MG PO TABS: 100.0000 mg | ORAL_TABLET | Freq: Two times a day (BID) | ORAL | 0 refills | Status: DC

## 2017-07-29 NOTE — Progress Notes (Signed)
Chest x-ray results showing potential lingular pneumonia.  Will treat with doxycycline today.  Orders have been placed.  Will need 4 to 6-week follow-up appointment with chest x-ray.  Order for chest x-ray has been placed.  [name], FNP

## 2017-07-29 NOTE — Addendum Note (Signed)
Addended by: Lauraine Rinne on: 07/29/2017 08:59 AM   Modules accepted: Orders

## 2017-07-29 NOTE — Progress Notes (Signed)
07/29/17 0856  After reviewing chest x-ray results will treat with doxycycline for potential lingular pneumonia.  Will repeat chest x-ray in 4 to 6 weeks with a follow-up appointment in 4-6 weeks.   [name] FNP

## 2017-08-03 ENCOUNTER — Telehealth: Payer: Self-pay | Admitting: Internal Medicine

## 2017-08-03 DIAGNOSIS — J479 Bronchiectasis, uncomplicated: Secondary | ICD-10-CM

## 2017-08-03 MED ORDER — BUDESONIDE-FORMOTEROL FUMARATE 80-4.5 MCG/ACT IN AERO: 2.0000 | INHALATION_SPRAY | Freq: Two times a day (BID) | RESPIRATORY_TRACT | 12 refills | Status: DC

## 2017-08-03 NOTE — Telephone Encounter (Signed)
Rx sent to pt's pharmacy of choice of symb 80. Called and spoke with pt letting her know this was done.  Pt expressed understanding. Nothing further needed.

## 2017-08-05 ENCOUNTER — Telehealth: Payer: Self-pay | Admitting: Internal Medicine

## 2017-08-05 DIAGNOSIS — J479 Bronchiectasis, uncomplicated: Secondary | ICD-10-CM

## 2017-08-05 MED ORDER — BUDESONIDE-FORMOTEROL FUMARATE 80-4.5 MCG/ACT IN AERO
2.0000 | INHALATION_SPRAY | Freq: Two times a day (BID) | RESPIRATORY_TRACT | 3 refills | Status: DC
Start: 2017-08-05 — End: 2017-11-10

## 2017-08-05 NOTE — Telephone Encounter (Signed)
Called [facility] and spoke with Tanner  Verbal order given for the Symbicort 80  2puffs BID, #1 inhaler with 3 additional refills (further refills to be sent under MD's name)  Called spoke with patient's daughter [name] Apologized for the inconvenience but informed her that Rx has been telephoned to the [place] voiced her understanding  Nothing further needed; will sign off

## 2017-08-19 ENCOUNTER — Ambulatory Visit (INDEPENDENT_AMBULATORY_CARE_PROVIDER_SITE_OTHER): Payer: Medicare Other | Admitting: Internal Medicine

## 2017-08-19 ENCOUNTER — Encounter: Payer: Self-pay | Admitting: Internal Medicine

## 2017-08-19 VITALS — BP 140/80 | HR 99 | Ht 65.0 in | Wt 113.6 lb

## 2017-08-19 DIAGNOSIS — R05 Cough: Secondary | ICD-10-CM

## 2017-08-19 DIAGNOSIS — J479 Bronchiectasis, uncomplicated: Secondary | ICD-10-CM | POA: Diagnosis not present

## 2017-08-19 DIAGNOSIS — R053 Chronic cough: Secondary | ICD-10-CM

## 2017-08-19 NOTE — Patient Instructions (Addendum)
We will call Dr Keane Police office for your lab results  Work on inhaler technique:  relax and gently blow all the way out then take a nice smooth deep breath back in, triggering the inhaler at same time you start breathing in.  Hold for up to 5 seconds if you can. Blow out thru nose. Rinse and gargle with water when done      Please schedule a follow up visit in 6  months but call sooner if needed

## 2017-08-19 NOTE — Progress Notes (Signed)
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 Consult    cough x 6 months no improvement, discolored mucous, 2 rounds of antibiotics. Chest CT 5/30  started with a head cold before xmas 2018  "just like everybody else" and some better after abx then worse p several weeks p finished abx and about the same since despite additional coursed abx though does not know names  Mucus was mostly clear occ yellow min volume and no change off  abx x several months  Worse at hs then resolves s noct or am distubance or flare  rec Augmentin 875 mg take one pill twice daily  X 10 days - Prednisone 10 mg take  4 each am x 2 days,   2 each am x 2 days,  1 each am x 2 days and stop  For mucinex dm 1200 mg every 12 hours and use the flutter valve as much as you can  Try prilosec otc 20mg   Take 30-60 min before first meal of the day and Pepcid ac (famotidine) 20 mg one @  bedtime until cough is completely gone for at least a week without the need for cough suppression GERD (REFLUX)   If not better when you finish your augmentin you will need a CT sinus > neg 07/20/17   07/21/17 NP Eval/ [name] Spirometry c/w mild obst > symb 80 2bid / zyrtec hs   08/19/2017  f/u ov/[facility] re: obst bronchiectasis  Chief Complaint  Patient presents with  . Follow-up    Pt states she does not cough much. She only coughs after uses her flutter valve about 3 x per day- prod with clear sputum.     Dyspnea:  Not limited Cough: some hoarseness but cough is much better  SABA use: none 02: none     No obvious day to day or  daytime variability or assoc excess/ purulent sputum or mucus plugs or hemoptysis or cp or chest tightness, subjective wheeze or overt sinus or hb symptoms.   Sleeping: flat/ 1 pillow  without nocturnal  or early am exacerbation  of respiratory  c/o's or need for noct saba. Also denies any obvious fluctuation of symptoms with weather or environmental changes or other aggravating or alleviating factors except as outlined above   No unusual exposure hx or h/o childhood pna/ asthma or knowledge of premature birth.  Current Allergies, Complete Past Medical History, Past Surgical History, Family History, and Social History were reviewed in [employer] record.  ROS  The following are not active complaints unless bolded Hoarseness, sore throat, dysphagia, dental problems, itching, sneezing,  nasal congestion or discharge of excess mucus or purulent secretions, ear ache,   fever, chills, sweats, unintended wt loss or wt gain, classically pleuritic or exertional cp,  orthopnea pnd or arm/hand swelling  or leg swelling, presyncope, palpitations, abdominal pain, anorexia, nausea, vomiting, diarrhea  or change in bowel habits or change in bladder habits, change in stools or change in urine, dysuria, hematuria,  rash, arthralgias,  visual complaints, headache, numbness, weakness or ataxia or problems with walking or coordination,  change in mood or  memory.        Current Meds  Medication Sig  . amLODipine (NORVASC) 10 MG tablet Take 10 mg by mouth daily.  Marland Kitchen aspirin 81 MG tablet Take 81 mg by mouth daily.  Marland Kitchen atorvastatin (LIPITOR) 20 MG tablet Take 20 mg by mouth daily.  . budesonide-formoterol (SYMBICORT) 80-4.5 MCG/ACT inhaler Inhale 2 puffs into the lungs every 12 (twelve) hours.  . Calcium Carbonate-Vitamin D (CALTRATE 600+D) 600-400 MG-UNIT per tablet Take 1 tablet by mouth daily.  . cholecalciferol (VITAMIN D) 1000 UNITS tablet Take 1,000 Units by mouth daily.  Marland Kitchen denosumab (PROLIA)  60 MG/ML SOLN injection Inject 60 mg into the skin every 6 (six) months. Given in May and Nov. - Administer in upper arm, thigh, or abdomen  . famotidine (PEPCID) 20 MG tablet Take 1 tablet (20 mg total) by mouth 2 (two) times daily.  . Multiple Vitamin (MULTIVITAMIN WITH MINERALS) TABS Take 1 tablet by mouth daily.  . Multiple Vitamins-Minerals (PRESERVISION AREDS 2) CAPS Take by mouth.  . Respiratory Therapy Supplies (FLUTTER) DEVI Use as directed              Objective:   Physical Exam  amb wf nad looks much younger than stated age    08/19/2017       113   07/01/17 113 lb 12.8 oz (51.6 kg)  03/17/17 118 lb 9.6 oz (53.8 kg)  09/04/15 124 lb 3.2 oz (56.3 kg)     Vital signs reviewed - Note on arrival 02 sats  96% on RA   HEENT: nl dentition, turbinates bilaterally, and oropharynx. Nl external ear canals without cough reflex   NECK :  without JVD/Nodes/TM/ nl carotid upstrokes bilaterally   LUNGS: no acc muscle use,  Nl contour chest which is clear to A and P bilaterally without cough on insp or exp maneuvers   CV:  RRR  no s3 or murmur or increase in P2, and no edema   ABD:  soft and nontender with nl inspiratory excursion in the supine position. No bruits or organomegaly appreciated, bowel sounds nl  MS:  Nl gait/ ext warm without deformities, calf tenderness, cyanosis or clubbing No obvious joint restrictions   SKIN: warm and dry without lesions    NEURO:  alert, approp, nl sensorium with  no motor or cerebellar deficits apparent.                Assessment:

## 2017-08-20 ENCOUNTER — Encounter: Payer: Self-pay | Admitting: Internal Medicine

## 2017-08-20 NOTE — Assessment & Plan Note (Signed)
See CT chest 06/26/17 with lingular dz most prominent  - Spirometry 08/19/2017  FEV1 1.15 (72%)  Ratio 63  Classic curvature - Started symbicort 80 2bid   - The proper method of use, as well as anticipated side effects, of a metered-dose inhaler are discussed and demonstrated to the patient   She has improved on present rx > no need to change :  Symb/ mucinex/ flutter   Reviewed bronchiectasis pathophysiology and rx options using escalator analogy    F/u can be q 6 m, sooner for any flares   I had an extended discussion with the patient reviewing all relevant studies completed to date and  lasting 15 to 20 minutes of a 25 minute visit    See device teaching which extended face to face time for this visit.  Each maintenance medication was reviewed in detail including emphasizing most importantly the difference between maintenance and prns and under what circumstances the prns are to be triggered using an action plan format that is not reflected in the computer generated alphabetically organized AVS which I have not found useful in most complex patients, especially with respiratory illnesses  Please see AVS for specific instructions unique to this visit that I personally wrote and verbalized to the the pt in detail and then reviewed with pt  by my nurse highlighting any  changes in therapy recommended at today's visit to their plan of care.

## 2017-08-20 NOTE — Assessment & Plan Note (Signed)
Allergy profile 07/01/17 >  Eos 0.0 /  IgE  < 2 RAST  - 07/01/2017 rec augmentin x 10 days then sinus CT done 07/20/2017 >No significant paranasal sinus disease. 07/21/17 Feno   24  No evidence of significant T2 inflammatory process or underlying sinusitis

## 2017-09-15 ENCOUNTER — Ambulatory Visit (HOSPITAL_COMMUNITY)
Admission: RE | Admit: 2017-09-15 | Discharge: 2017-09-15 | Disposition: A | Discharge status: Home / Self Care | Payer: Medicare Other | Source: Ambulatory Visit | Attending: Internal Medicine | Admitting: Internal Medicine

## 2017-09-15 ENCOUNTER — Encounter (HOSPITAL_COMMUNITY): Payer: Self-pay

## 2017-09-15 DIAGNOSIS — M810 Age-related osteoporosis without current pathological fracture: Secondary | ICD-10-CM | POA: Diagnosis not present

## 2017-09-15 MED ORDER — DENOSUMAB 60 MG/ML ~~LOC~~ SOSY
60.0000 mg | PREFILLED_SYRINGE | Freq: Once | SUBCUTANEOUS | Status: AC
  Administered 2017-09-15: 60 mg via SUBCUTANEOUS
  Filled 2017-09-15: qty 1

## 2017-09-15 NOTE — Discharge Instructions (Signed)
Denosumab injection °What is this medicine? °DENOSUMAB (den oh sue mab) slows bone breakdown. Prolia is used to treat osteoporosis in women after menopause and in men. Xgeva is used to treat a high calcium level due to cancer and to prevent bone fractures and other bone problems caused by multiple myeloma or cancer bone metastases. Xgeva is also used to treat giant cell tumor of the bone. °This medicine may be used for other purposes; ask your health care provider or pharmacist if you have questions. °COMMON BRAND NAME(S): Prolia, XGEVA °What should I tell my health care provider before I take this medicine? °They need to know if you have any of these conditions: °-dental disease °-having surgery or tooth extraction °-infection °-kidney disease °-low levels of calcium or Vitamin D in the blood °-malnutrition °-on hemodialysis °-skin conditions or sensitivity °-thyroid or parathyroid disease °-an unusual reaction to denosumab, other medicines, foods, dyes, or preservatives °-pregnant or trying to get pregnant °-breast-feeding °How should I use this medicine? °This medicine is for injection under the skin. It is given by a health care professional in a hospital or clinic setting. °If you are getting Prolia, a special MedGuide will be given to you by the pharmacist with each prescription and refill. Be sure to read this information carefully each time. °For Prolia, talk to your pediatrician regarding the use of this medicine in children. Special care may be needed. For Xgeva, talk to your pediatrician regarding the use of this medicine in children. While this drug may be prescribed for children as young as 13 years for selected conditions, precautions do apply. °Overdosage: If you think you have taken too much of this medicine contact a poison control center or emergency room at once. °NOTE: This medicine is only for you. Do not share this medicine with others. °What if I miss a dose? °It is important not to miss your  dose. Call your doctor or health care professional if you are unable to keep an appointment. °What may interact with this medicine? °Do not take this medicine with any of the following medications: °-other medicines containing denosumab °This medicine may also interact with the following medications: °-medicines that lower your chance of fighting infection °-steroid medicines like prednisone or cortisone °This list may not describe all possible interactions. Give your health care provider a list of all the medicines, herbs, non-prescription drugs, or dietary supplements you use. Also tell them if you smoke, drink alcohol, or use illegal drugs. Some items may interact with your medicine. °What should I watch for while using this medicine? °Visit your doctor or health care professional for regular checks on your progress. Your doctor or health care professional may order blood tests and other tests to see how you are doing. °Call your doctor or health care professional for advice if you get a fever, chills or sore throat, or other symptoms of a cold or flu. Do not treat yourself. This drug may decrease your body's ability to fight infection. Try to avoid being around people who are sick. °You should make sure you get enough calcium and vitamin D while you are taking this medicine, unless your doctor tells you not to. Discuss the foods you eat and the vitamins you take with your health care professional. °See your dentist regularly. Brush and floss your teeth as directed. Before you have any dental work done, tell your dentist you are receiving this medicine. °Do not become pregnant while taking this medicine or for 5 months after stopping   it. Talk with your doctor or health care professional about your birth control options while taking this medicine. Women should inform their doctor if they wish to become pregnant or think they might be pregnant. There is a potential for serious side effects to an unborn child. Talk  to your health care professional or pharmacist for more information. What side effects may I notice from receiving this medicine? Side effects that you should report to your doctor or health care professional as soon as possible: -allergic reactions like skin rash, itching or hives, swelling of the face, lips, or tongue -bone pain -breathing problems -dizziness -jaw pain, especially after dental work -redness, blistering, peeling of the skin -signs and symptoms of infection like fever or chills; cough; sore throat; pain or trouble passing urine -signs of low calcium like fast heartbeat, muscle cramps or muscle pain; pain, tingling, numbness in the hands or feet; seizures -unusual bleeding or bruising -unusually weak or tired Side effects that usually do not require medical attention (report to your doctor or health care professional if they continue or are bothersome): -constipation -diarrhea -headache -joint pain -loss of appetite -muscle pain -runny nose -tiredness -upset stomach This list may not describe all possible side effects. Call your doctor for medical advice about side effects. You may report side effects to FDA at 1-800-FDA-1088. Where should I keep my medicine? This medicine is only given in a clinic, doctor's office, or other health care setting and will not be stored at home. NOTE: This sheet is a summary. It may not cover all possible information. If you have questions about this medicine, talk to your doctor, pharmacist, or health care provider.  2018 Elsevier/Gold Standard (2016-02-05 19:17:21)

## 2017-10-08 ENCOUNTER — Ambulatory Visit
Admission: RE | Admit: 2017-10-08 | Discharge: 2017-10-08 | Disposition: A | Discharge status: Home / Self Care | Payer: Medicare Other | Source: Ambulatory Visit | Attending: Internal Medicine | Admitting: Internal Medicine

## 2017-10-08 DIAGNOSIS — Z1231 Encounter for screening mammogram for malignant neoplasm of breast: Secondary | ICD-10-CM | POA: Diagnosis not present

## 2017-10-19 DIAGNOSIS — H26492 Other secondary cataract, left eye: Secondary | ICD-10-CM | POA: Diagnosis not present

## 2017-10-19 DIAGNOSIS — H43813 Vitreous degeneration, bilateral: Secondary | ICD-10-CM | POA: Diagnosis not present

## 2017-10-19 DIAGNOSIS — H353133 Nonexudative age-related macular degeneration, bilateral, advanced atrophic without subfoveal involvement: Secondary | ICD-10-CM | POA: Diagnosis not present

## 2017-10-19 DIAGNOSIS — H52203 Unspecified astigmatism, bilateral: Secondary | ICD-10-CM | POA: Diagnosis not present

## 2017-10-23 DIAGNOSIS — R82998 Other abnormal findings in urine: Secondary | ICD-10-CM | POA: Diagnosis not present

## 2017-10-23 DIAGNOSIS — I10 Essential (primary) hypertension: Secondary | ICD-10-CM | POA: Diagnosis not present

## 2017-10-23 DIAGNOSIS — M810 Age-related osteoporosis without current pathological fracture: Secondary | ICD-10-CM | POA: Diagnosis not present

## 2017-10-23 DIAGNOSIS — E7849 Other hyperlipidemia: Secondary | ICD-10-CM | POA: Diagnosis not present

## 2017-10-30 DIAGNOSIS — Z1389 Encounter for screening for other disorder: Secondary | ICD-10-CM | POA: Diagnosis not present

## 2017-10-30 DIAGNOSIS — I872 Venous insufficiency (chronic) (peripheral): Secondary | ICD-10-CM | POA: Diagnosis not present

## 2017-10-30 DIAGNOSIS — R05 Cough: Secondary | ICD-10-CM | POA: Diagnosis not present

## 2017-10-30 DIAGNOSIS — N183 Chronic kidney disease, stage 3 (moderate): Secondary | ICD-10-CM | POA: Diagnosis not present

## 2017-10-30 DIAGNOSIS — I129 Hypertensive chronic kidney disease with stage 1 through stage 4 chronic kidney disease, or unspecified chronic kidney disease: Secondary | ICD-10-CM | POA: Diagnosis not present

## 2017-10-30 DIAGNOSIS — Z23 Encounter for immunization: Secondary | ICD-10-CM | POA: Diagnosis not present

## 2017-10-30 DIAGNOSIS — J479 Bronchiectasis, uncomplicated: Secondary | ICD-10-CM | POA: Diagnosis not present

## 2017-10-30 DIAGNOSIS — E46 Unspecified protein-calorie malnutrition: Secondary | ICD-10-CM | POA: Diagnosis not present

## 2017-10-30 DIAGNOSIS — D692 Other nonthrombocytopenic purpura: Secondary | ICD-10-CM | POA: Diagnosis not present

## 2017-10-30 DIAGNOSIS — Z0000 Encounter for general adult medical examination without abnormal findings: Secondary | ICD-10-CM | POA: Diagnosis not present

## 2017-10-30 DIAGNOSIS — R634 Abnormal weight loss: Secondary | ICD-10-CM | POA: Diagnosis not present

## 2017-10-30 DIAGNOSIS — I10 Essential (primary) hypertension: Secondary | ICD-10-CM | POA: Diagnosis not present

## 2017-10-30 DIAGNOSIS — Z681 Body mass index (BMI) 19 or less, adult: Secondary | ICD-10-CM | POA: Diagnosis not present

## 2017-11-10 ENCOUNTER — Encounter: Payer: Self-pay | Admitting: Pulmonary Disease

## 2017-11-10 ENCOUNTER — Ambulatory Visit (INDEPENDENT_AMBULATORY_CARE_PROVIDER_SITE_OTHER): Payer: Medicare Other | Admitting: Pulmonary Disease

## 2017-11-10 VITALS — BP 128/74 | HR 91 | Ht 65.0 in | Wt 113.2 lb

## 2017-11-10 DIAGNOSIS — R053 Chronic cough: Secondary | ICD-10-CM

## 2017-11-10 DIAGNOSIS — R05 Cough: Secondary | ICD-10-CM

## 2017-11-10 DIAGNOSIS — J479 Bronchiectasis, uncomplicated: Secondary | ICD-10-CM

## 2017-11-10 DIAGNOSIS — J309 Allergic rhinitis, unspecified: Secondary | ICD-10-CM | POA: Diagnosis not present

## 2017-11-10 DIAGNOSIS — H6980 Other specified disorders of Eustachian tube, unspecified ear: Secondary | ICD-10-CM | POA: Diagnosis not present

## 2017-11-10 MED ORDER — FLUTICASONE PROPIONATE 50 MCG/ACT NA SUSP: 2.0000 | Freq: Two times a day (BID) | NASAL | 3 refills | Status: AC

## 2017-11-10 MED ORDER — OMEPRAZOLE 20 MG PO CPDR
20.0000 mg | DELAYED_RELEASE_CAPSULE | Freq: Every day | ORAL | 4 refills | Status: DC
Start: 2017-11-10 — End: 2020-08-04

## 2017-11-10 NOTE — Patient Instructions (Addendum)
Cough Instructions:   . We believe you have a chronic/cyclical cough that is aggravated by reflux , coughing , and drainage.  . Goal is to not Cough or clear throat.  Marland Kitchen Avoid coughing or clearing throat by using:  o non-mint products/sugarless candy o Water o ice chips o Remember NO MINT PRODUCTS  . Medications to use:  o Mucinex DM 1-2 every 12 hrs or Delsym 2 tsp every 12 hrs f or cough o Tessalon Three times a day  As needed  Cough.  o Prilosec 20mg  30 min before breakfast - Review GERD literature below  o Zyrtec 10mg  at bedtime - Can get generic  o Flonase / Fluticasone 1 spray each nostril daily  o Chlor tabs 4mg  2 at bedtime  for nasal drip until cough is 100% cough free.    Follow up in 4 - 6 weeks with Dr. Melvyn Novas    November/2019 we will be moving! We will no longer be at our [place] location.  Be on the look out for a post card/mailer to let you know we have officially moved.  Our new address and phone number will be:  [address] [place], [place] [address] Telephone number: [phone]  It is flu season:   >>>Remember to be washing your hands regularly, using hand sanitizer, be careful to use around herself with has contact with people who are sick will increase her chances of getting sick yourself. >>> Best ways to protect herself from the flu: Receive the yearly flu vaccine, practice good hand hygiene washing with soap and also using hand sanitizer when available, eat a nutritious meals, get adequate rest, hydrate appropriately   Please contact the office if your symptoms worsen or you have concerns that you are not improving.   Thank you for choosing [facility] for your healthcare, and for allowing [place] to partner with you on your healthcare journey. I am thankful to be able to provide care to you today.   [name] FNP-C    Gastroesophageal Reflux Disease, Adult Normally, food travels down the esophagus and stays in the stomach to be  digested. If a person has gastroesophageal reflux disease (GERD), food and stomach acid move back up into the esophagus. When this happens, the esophagus becomes sore and swollen (inflamed). Over time, GERD can make small holes (ulcers) in the lining of the esophagus. Follow these instructions at home: Diet  Follow a diet as told by your doctor. You may need to avoid foods and drinks such as: ? Coffee and tea (with or without caffeine). ? Drinks that contain alcohol. ? Energy drinks and sports drinks. ? Carbonated drinks or sodas. ? Chocolate and cocoa. ? Peppermint and mint flavorings. ? Garlic and onions. ? Horseradish. ? Spicy and acidic foods, such as peppers, chili powder, curry powder, vinegar, hot sauces, and BBQ sauce. ? Citrus fruit juices and citrus fruits, such as oranges, lemons, and limes. ? Tomato-based foods, such as red sauce, chili, salsa, and pizza with red sauce. ? Fried and fatty foods, such as donuts, french fries, potato chips, and high-fat dressings. ? High-fat meats, such as hot dogs, rib eye steak, sausage, ham, and bacon. ? High-fat dairy items, such as whole milk, butter, and cream cheese.  Eat small meals often. Avoid eating large meals.  Avoid drinking large amounts of liquid with your meals.  Avoid eating meals during the 2-3 hours before bedtime.  Avoid lying down right after you eat.  Do  not exercise right after you eat. General instructions  Pay attention to any changes in your symptoms.  Take over-the-counter and prescription medicines only as told by your doctor. Do not take aspirin, ibuprofen, or other NSAIDs unless your doctor says it is okay.  Do not use any tobacco products, including cigarettes, chewing tobacco, and e-cigarettes. If you need help quitting, ask your doctor.  Wear loose clothes. Do not wear anything tight around your waist.  Raise (elevate) the head of your bed about 6 inches (15 cm).  Try to lower your stress. If you  need help doing this, ask your doctor.  If you are overweight, lose an amount of weight that is healthy for you. Ask your doctor about a safe weight loss goal.  Keep all follow-up visits as told by your doctor. This is important. Contact a doctor if:  You have new symptoms.  You lose weight and you do not know why it is happening.  You have trouble swallowing, or it hurts to swallow.  You have wheezing or a cough that keeps happening.  Your symptoms do not get better with treatment.  You have a hoarse voice. Get help right away if:  You have pain in your arms, neck, jaw, teeth, or back.  You feel sweaty, dizzy, or light-headed.  You have chest pain or shortness of breath.  You throw up (vomit) and your throw up looks like blood or coffee grounds.  You pass out (faint).  Your poop (stool) is bloody or black.  You cannot swallow, drink, or eat. This information is not intended to replace advice given to you by your health care provider. Make sure you discuss any questions you have with your health care provider. Document Released: 07/02/2007 Document Revised: 06/21/2015 Document Reviewed: 05/10/2014 Elsevier Interactive Patient Education  2018 [employer] for Gastroesophageal Reflux Disease, Adult When you have gastroesophageal reflux disease (GERD), the foods you eat and your eating habits are very important. Choosing the right foods can help ease your discomfort. What guidelines do I need to follow?  Choose fruits, vegetables, whole grains, and low-fat dairy products.  Choose low-fat meat, fish, and poultry.  Limit fats such as oils, salad dressings, butter, nuts, and avocado.  Keep a food diary. This helps you identify foods that cause symptoms.  Avoid foods that cause symptoms. These may be different for everyone.  Eat small meals often instead of 3 large meals a day.  Eat your meals slowly, in a place where you are relaxed.  Limit fried  foods.  Cook foods using methods other than frying.  Avoid drinking alcohol.  Avoid drinking large amounts of liquids with your meals.  Avoid bending over or lying down until 2-3 hours after eating. What foods are not recommended? These are some foods and drinks that may make your symptoms worse: Vegetables Tomatoes. Tomato juice. Tomato and spaghetti sauce. Chili peppers. Onion and garlic. Horseradish. Fruits Oranges, grapefruit, and lemon (fruit and juice). Meats High-fat meats, fish, and poultry. This includes hot dogs, ribs, ham, sausage, salami, and bacon. Dairy Whole milk and chocolate milk. Sour cream. Cream. Butter. Ice cream. Cream cheese. Drinks Coffee and tea. Bubbly (carbonated) drinks or energy drinks. Condiments Hot sauce. Barbecue sauce. Sweets/Desserts Chocolate and cocoa. Donuts. Peppermint and spearmint. Fats and Oils High-fat foods. This includes [place] fries and potato chips. Other Vinegar. Strong spices. This includes black pepper, white pepper, red pepper, cayenne, curry powder, cloves, ginger, and chili powder. The  items listed above may not be a complete list of foods and drinks to avoid. Contact your dietitian for more information. This information is not intended to replace advice given to you by your health care provider. Make sure you discuss any questions you have with your health care provider. Document Released: 07/15/2011 Document Revised: 06/21/2015 Document Reviewed: 11/17/2012 Elsevier Interactive Patient Education  2017 [employer].

## 2017-11-10 NOTE — Progress Notes (Signed)
@Patient  ID: Mary Kim, female    DOB: May 30, 1923, 84 y.o.   MRN: [id number]  Chief Complaint  Patient presents with  . 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 Virgina Jock with chronic cough.   Smoker/ Smoking History: never smoker  Maintenance:  None  Pt of: Dr. Melvyn Novas  11/10/2017  - Visit   84 year old female patient presenting today for acute visit for cough.  Patient reports that this is been a chronic issue for the last 9 months but over the last 2 weeks symptoms have worsened.  Patient was initially on Pepcid but this ran out about a month and a half ago.  Patient is not currently on a daily antihistamine.  Patient has not been using her flutter valve regularly for management of bronchiectasis.  Patient was recently seen by primary care and was started on Omnicef for 10 days.  No chest x-ray was completed at that time.  Patient does not feel the symptoms have improved with antibiotic.   See cough ROS.    Tests:   Imaging:  07/20/2017-CT maxillofacial-limited without contrast, no significant paraspinal paranasal sinus disease, maxillary sinuses are clear except for minimal mucosal thickening  Cardiac:   Labs:  07/01/2017-CBC with differential-WBC 11.4 07/01/2017-respiratory allergy panel-negative  Micro:   Chart Review:   FENO:  No results found for: NITRICOXIDE June/ 2019 >>> 24  PFT: No flowsheet data found.  Imaging: No results found.  Chart Review:    Specialty Problems      Pulmonary Problems   Chronic cough    Allergy profile 07/01/17 >  Eos 0.0 /  IgE  < 2 RAST  - 07/01/2017 rec augmentin x 10 days then sinus CT done 07/20/2017 >No significant paranasal sinus disease. 07/21/17 Feno   24      Obstructive bronchiectasis (HCC)    See CT chest 06/26/17 with lingular dz most prominent  -  Spirometry 08/19/2017  FEV1 1.15 (72%)  Ratio 63  Classic curvature - Started symbicort 80 2bid       Allergic rhinitis      No Known Allergies  Immunization History  Administered Date(s) Administered  . Influenza, High Dose Seasonal PF 10/30/2017  . Influenza-Unspecified 10/31/2016    Past Medical History:  Diagnosis Date  . Elevated cholesterol   . Hearing loss   . Hyperlipidemia   . Hypertension   . Osteoporosis     Tobacco History: Social History   Tobacco Use  Smoking Status Never Smoker  Smokeless Tobacco Never Used   Counseling given: Yes  Continue not smoke  Outpatient Encounter Medications as of 11/10/2017  Medication Sig  . amLODipine (NORVASC) 10 MG tablet Take 10 mg by mouth daily.  Marland Kitchen aspirin 81 MG tablet Take 81 mg by mouth daily.  Marland Kitchen atorvastatin (LIPITOR) 20 MG tablet Take 20 mg by mouth daily.  . Calcium Carbonate-Vitamin D (CALTRATE 600+D) 600-400 MG-UNIT per tablet Take 1 tablet by mouth daily.  . cholecalciferol (VITAMIN D) 1000 UNITS tablet Take 1,000 Units by mouth daily.  Marland Kitchen denosumab (PROLIA) 60 MG/ML SOLN injection Inject 60 mg into the skin every 6 (six) months. Given in May and Nov. - Administer in upper arm, thigh, or abdomen  . Multiple Vitamin (MULTIVITAMIN WITH MINERALS) TABS Take 1 tablet  by mouth daily.  . Multiple Vitamins-Minerals (PRESERVISION AREDS 2) CAPS Take by mouth.  . Respiratory Therapy Supplies (FLUTTER) DEVI Use as directed  . fluticasone (FLONASE) 50 MCG/ACT nasal spray Place 2 sprays into both nostrils 2 (two) times daily.  Marland Kitchen omeprazole (PRILOSEC) 20 MG capsule Take 1 capsule (20 mg total) by mouth daily.  . [DISCONTINUED] budesonide-formoterol (SYMBICORT) 80-4.5 MCG/ACT inhaler Inhale 2 puffs into the lungs every 12 (twelve) hours. (Patient not taking: Reported on 11/10/2017)  . [DISCONTINUED] famotidine (PEPCID) 20 MG tablet Take 1 tablet (20 mg total) by mouth 2 (two) times daily. (Patient not taking: Reported on  11/10/2017)   No facility-administered encounter medications on file as of 11/10/2017.      Review of Systems  Review of Systems  Constitutional: Positive for activity change (decreased ), appetite change (decreased appetite ) and fatigue. Negative for chills, fever and unexpected weight change.  HENT: Positive for congestion, hearing loss (Muffled hearing, full years) and postnasal drip. Negative for ear pain, sinus pressure and sinus pain.   Respiratory: Positive for cough (dry cough ). Negative for chest tightness, shortness of breath and wheezing.   Cardiovascular: Negative for chest pain and palpitations.  Gastrointestinal: Negative for blood in stool, diarrhea, nausea and vomiting.       Occasional epigastric pain, occasional heartburn  Genitourinary: Negative for dysuria, frequency and urgency.  Musculoskeletal: Negative for arthralgias.  Skin: Negative for color change.  Allergic/Immunologic: Negative for environmental allergies and food allergies.  Neurological: Negative for dizziness, light-headedness and headaches.  Psychiatric/Behavioral: Negative for dysphoric mood. The patient is not nervous/anxious.   All other systems reviewed and are negative.    Cough ROS:   When to the symptoms start: 9 months  How are you today: last 2 weeks been worsening, today is an okay day   Have you had fever/sore throat (first 5 to 7 days of URI) or Have you had cough/nasal congestion (10 to 14 days of URI) : dry cough, nasal congestion Have you used anything to treat the cough, as anything improved : cough drops, mucinex d  Is it a dry or wet cough: dry cough  Does the cough happen when your breathing or when you breathe out: unsure  Other any triggers to your cough, or any aggravating factors: exertion, middle to end of the day   Daily antihistamine: none  GERD treatment: stopped pepcid (1.5 months ago) Singulair: none   Cough checklist (bolded indicates presence):  Adherence,  ?acid reflux, ACE inhibitor, active sinus disease, active smoking, adverse effects of medications (amiodarone/Macrodantin/bb), alpha 1, allergies, aspiration, anxiety, bronchiectasis, congestive heart failure (diastolic)   Physical Exam  BP 128/74 (BP Location: Right Arm, Patient Position: Sitting, Cuff Size: Normal)   Pulse 91   Ht 5\' 5"  (1.651 m)   Wt 113 lb 3.2 oz (51.3 kg)   SpO2 97%   BMI 18.84 kg/m   Wt Readings from Last 5 Encounters:  11/10/17 113 lb 3.2 oz (51.3 kg)  09/15/17 118 lb 9.6 oz (53.8 kg)  08/19/17 113 lb 9.6 oz (51.5 kg)  07/21/17 115 lb 6.4 oz (52.3 kg)  07/01/17 113 lb 12.8 oz (51.6 kg)    Physical Exam  Constitutional: She is oriented to person, place, and time and well-developed, well-nourished, and in no distress. No distress.  HENT:  Head: Normocephalic and atraumatic.  Right Ear: Hearing, external ear and ear canal normal.  Left Ear: Hearing, external ear and ear canal normal.  Nose: Mucosal edema and  rhinorrhea present. Right sinus exhibits no maxillary sinus tenderness and no frontal sinus tenderness. Left sinus exhibits no maxillary sinus tenderness and no frontal sinus tenderness.  Mouth/Throat: Uvula is midline and oropharynx is clear and moist. No oropharyngeal exudate.  +TMs with effusion bilaterally, +post nasal drip   Eyes: Pupils are equal, round, and reactive to light.  Neck: Normal range of motion. Neck supple. No JVD present.  Cardiovascular: Normal rate, regular rhythm and normal heart sounds.  Pulmonary/Chest: Effort normal and breath sounds normal. No accessory muscle usage. No respiratory distress. She has no decreased breath sounds. She has no wheezes. She has no rhonchi.  Musculoskeletal: Normal range of motion. She exhibits no edema.  Lymphadenopathy:    She has no cervical adenopathy.  Neurological: She is alert and oriented to person, place, and time. Gait normal.  Skin: Skin is warm and dry. She is not diaphoretic. No erythema.    Psychiatric: Mood, memory, affect and judgment normal.  Nursing note and vitals reviewed.    Lab Results:  CBC    Component Value Date/Time   WBC 11.4 (H) 07/01/2017 1056   RBC 3.92 07/01/2017 1056   HGB 12.9 07/01/2017 1056   HCT 38.5 07/01/2017 1056   PLT 244.0 07/01/2017 1056   MCV 98.2 07/01/2017 1056   MCH 32.4 12/15/2011 1003   MCHC 33.6 07/01/2017 1056   RDW 13.4 07/01/2017 1056   LYMPHSABS 6.2 (H) 07/01/2017 1056   MONOABS 0.4 07/01/2017 1056   EOSABS 0.0 07/01/2017 1056   BASOSABS 0.1 07/01/2017 1056    BMET    Component Value Date/Time   NA 139 12/15/2011 1003   K 4.4 12/15/2011 1003   CL 103 12/15/2011 1003   CO2 28 12/15/2011 1003   GLUCOSE 100 (H) 12/15/2011 1003   BUN 19 12/15/2011 1003   CREATININE 1.02 12/15/2011 1003   CALCIUM 9.4 12/15/2011 1003   GFRNONAA 48 (L) 12/15/2011 1003   GFRAA 56 (L) 12/15/2011 1003    BNP No results found for: BNP  ProBNP No results found for: PROBNP    Assessment & Plan:   Pleasant 84 year old patient seen office visit today.  Patient with mild AR flare suspect this is driving her recurrent cough.  We will also add omeprazole.  Patient reporting epigastric pain.  Suspect due to uncontrolled GERD.  Patient prefers spicy food, tie food, and peppers.  Provided GERD literature and discussed with patient need to change her diet.  Patient also was not using flutter valve as instructed.  Patient to resume flutter valve use twice daily.  Patient to follow-up in 4 to 6 weeks with Dr. Melvyn Novas to ensure cough is resolving better manage.  Chronic cough Cough Instructions:   . We believe you have a chronic/cyclical cough that is aggravated by reflux , coughing , and drainage.  . Goal is to not Cough or clear throat.  Marland Kitchen Avoid coughing or clearing throat by using:  o non-mint products/sugarless candy o Water o ice chips o Remember NO MINT PRODUCTS  . Medications to use:  o Mucinex DM 1-2 every 12 hrs or Delsym 2 tsp every  12 hrs f or cough o Tessalon Three times a day  As needed  Cough.  o Prilosec 20mg  30 min before breakfast - Review GERD literature below  o Zyrtec 10mg  at bedtime - Can get generic  o Flonase / Fluticasone 1 spray each nostril daily  o Chlor tabs 4mg  2 at bedtime  for nasal drip until cough  is 100% cough free.    Follow up in 4 - 6 weeks with Dr. Melvyn Novas   Allergic rhinitis Start daily antihistamine such as Zyrtec 10 mg at night Start Flonase 1 spray each nostril daily until allergy and congestion symptoms resuolve then can use as needed  Obstructive bronchiectasis (HCC) Bronchiectasis: This is the medical term which indicates that you have damage, dilated airways making you more susceptible to respiratory infection. Use a flutter valve 10 breaths twice a day or 4 to 5 breaths 4-5 times a day to help clear mucus out Let us know if you have cough with change in mucus color or fevers or chills.  At that point you would need an antibiotic. Maintain a healthy nutritious diet, eating whole foods Take your medications as prescribed    Eustachian tube dysfunction, unspecified laterality Start daily zyrtec  Add flonase      Lauraine Rinne, NP 11/10/2017

## 2017-11-10 NOTE — Assessment & Plan Note (Signed)
Start daily zyrtec  Add flonase

## 2017-11-10 NOTE — Assessment & Plan Note (Signed)
Cough Instructions:   . We believe you have a chronic/cyclical cough that is aggravated by reflux , coughing , and drainage.  . Goal is to not Cough or clear throat.  Marland Kitchen Avoid coughing or clearing throat by using:  o non-mint products/sugarless candy o Water o ice chips o Remember NO MINT PRODUCTS  . Medications to use:  o Mucinex DM 1-2 every 12 hrs or Delsym 2 tsp every 12 hrs f or cough o Tessalon Three times a day  As needed  Cough.  o Prilosec 20mg  30 min before breakfast - Review GERD literature below  o Zyrtec 10mg  at bedtime - Can get generic  o Flonase / Fluticasone 1 spray each nostril daily  o Chlor tabs 4mg  2 at bedtime  for nasal drip until cough is 100% cough free.    Follow up in 4 - 6 weeks with Dr. Melvyn Novas

## 2017-11-10 NOTE — Assessment & Plan Note (Signed)
Bronchiectasis: This is the medical term which indicates that you have damage, dilated airways making you more susceptible to respiratory infection. Use a flutter valve 10 breaths twice a day or 4 to 5 breaths 4-5 times a day to help clear mucus out Let us know if you have cough with change in mucus color or fevers or chills.  At that point you would need an antibiotic. Maintain a healthy nutritious diet, eating whole foods Take your medications as prescribed   

## 2017-11-10 NOTE — Progress Notes (Signed)
Chart and office note reviewed in detail  > agree with a/p as outlined    

## 2017-11-10 NOTE — Assessment & Plan Note (Signed)
Start daily antihistamine such as Zyrtec 10 mg at night Start Flonase 1 spray each nostril daily until allergy and congestion symptoms resuolve then can use as needed

## 2017-11-24 DIAGNOSIS — H401123 Primary open-angle glaucoma, left eye, severe stage: Secondary | ICD-10-CM | POA: Diagnosis not present

## 2017-12-02 ENCOUNTER — Encounter: Payer: Self-pay | Admitting: Internal Medicine

## 2017-12-02 ENCOUNTER — Ambulatory Visit (INDEPENDENT_AMBULATORY_CARE_PROVIDER_SITE_OTHER)
Admission: RE | Admit: 2017-12-02 | Discharge: 2017-12-02 | Disposition: A | Discharge status: Home / Self Care | Payer: Medicare Other | Source: Ambulatory Visit | Attending: Internal Medicine | Admitting: Internal Medicine

## 2017-12-02 ENCOUNTER — Ambulatory Visit (INDEPENDENT_AMBULATORY_CARE_PROVIDER_SITE_OTHER): Payer: Medicare Other | Admitting: Internal Medicine

## 2017-12-02 VITALS — BP 136/78 | HR 90 | Ht 65.0 in | Wt 114.0 lb

## 2017-12-02 DIAGNOSIS — J479 Bronchiectasis, uncomplicated: Secondary | ICD-10-CM

## 2017-12-02 DIAGNOSIS — R05 Cough: Secondary | ICD-10-CM

## 2017-12-02 DIAGNOSIS — J471 Bronchiectasis with (acute) exacerbation: Secondary | ICD-10-CM | POA: Diagnosis not present

## 2017-12-02 DIAGNOSIS — R053 Chronic cough: Secondary | ICD-10-CM

## 2017-12-02 NOTE — Patient Instructions (Addendum)
If cough / congestion > mucinex max dose is 1200 mg every 12 hours and use the flutter valve as much as possible   GERD (REFLUX)  is an extremely common cause of respiratory symptoms just like yours , many times with no obvious heartburn at all.    It can be treated with medication, but also with lifestyle changes including elevation of the head of your bed (ideally with 6 inch  bed blocks),  Smoking cessation, avoidance of late meals, excessive alcohol, and avoid fatty foods, chocolate, peppermint, colas, red wine, and acidic juices such as orange juice.  NO MINT OR MENTHOL PRODUCTS SO NO COUGH DROPS   USE SUGARLESS CANDY INSTEAD (Jolley ranchers or Stover's or Life Savers) or even ice chips will also do - the key is to swallow to prevent all throat clearing. NO OIL BASED VITAMINS - use powdered substitutes.    Please remember to go to the  x-ray department downstairs in the basement  for your tests - we will call you with the results when they are available.       Please schedule a follow up visit in 6  months but call sooner if needed

## 2017-12-02 NOTE — Progress Notes (Signed)
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 Consult    cough x 6 months no improvement, discolored mucous, 2 rounds of antibiotics. Chest CT 5/30  started with a head cold before xmas 2018  "just like everybody else" and some better after abx then worse p several weeks p finished abx and about the same since despite additional coursed abx though does not know names  Mucus was mostly clear occ yellow min volume and no change off  abx x several months  Worse at hs then resolves s noct or am distubance or flare  rec Augmentin 875 mg take one pill twice daily  X 10 days - Prednisone 10 mg take  4 each am x 2 days,   2 each am x 2 days,  1 each am x 2 days and stop  For mucinex dm 1200 mg every 12 hours and use the flutter valve as much as you can  Try prilosec otc 20mg   Take 30-60 min before first meal of the day and Pepcid ac (famotidine) 20 mg one @  bedtime until cough is completely gone for at least a week without the need for cough suppression GERD (REFLUX)   If not better when you finish your augmentin you will need a CT sinus > neg 07/20/17   07/21/17 NP Eval/ [name] Spirometry c/w mild obst > symb 80 2bid / zyrtec hs  08/19/2017  f/u ov/ re: obst bronchiectasis  Chief Complaint  Patient presents with  . Follow-up    Pt states she does not cough much. She only coughs after uses her flutter valve about 3 x per day- prod with clear sputum.    Dyspnea:  Not limited Cough: some hoarseness but cough is much better SABA use: none 02: none   rec No change > did not continue  symbicort or pepcid      12/02/2017  f/u ov/ re: s/p bronchiectasis flare now off symbicort  And no change on vs off perceived  Chief Complaint  Patient presents with  . Follow-up    Cough is much improved. She produces clear sputum.   Dyspnea:  DR and back ok / [employer]  [place] leaning on cart  Cough: no am flare Using flutter  Prn  Sleeping: about 30 degrees with blocks / sleeps fine  SABA use: none 02: none   No obvious day to day or daytime variability or assoc excess/ purulent sputum or mucus plugs or hemoptysis or cp or chest tightness, subjective wheeze or overt sinus or hb symptoms.   Sleeping as above  without nocturnal  or early am exacerbation  of respiratory  c/o's or need for noct saba. Also denies any obvious fluctuation of symptoms with weather or environmental changes or other aggravating or alleviating factors except as outlined above   No unusual exposure hx or h/o childhood pna/ asthma or knowledge of premature birth.  Current Allergies, Complete Past Medical History, Past Surgical History, Family History, and Social History were reviewed  in [employer] record.  ROS  The following are not active complaints unless bolded Hoarseness, sore throat, dysphagia, dental problems, itching, sneezing,  nasal congestion or discharge of excess mucus or purulent secretions, ear ache,   fever, chills, sweats, unintended wt loss or wt gain, classically pleuritic or exertional cp,  orthopnea pnd or arm/hand swelling  or leg swelling, presyncope, palpitations, abdominal pain, anorexia, nausea, vomiting, diarrhea  or change in bowel habits or change in bladder habits, change in stools or change in urine, dysuria, hematuria,  rash, arthralgias, visual complaints, headache, numbness, weakness or ataxia or problems with walking or coordination,  change in mood or  memory.        Current Meds  Medication Sig  . amLODipine (NORVASC) 10 MG tablet Take 10 mg by  mouth daily.  Marland Kitchen aspirin 81 MG tablet Take 81 mg by mouth daily.  Marland Kitchen atorvastatin (LIPITOR) 20 MG tablet Take 20 mg by mouth daily.  . Calcium Carbonate-Vitamin D (CALTRATE 600+D) 600-400 MG-UNIT per tablet Take 1 tablet by mouth daily.  . cholecalciferol (VITAMIN D) 1000 UNITS tablet Take 1,000 Units by mouth daily.  Marland Kitchen denosumab (PROLIA) 60 MG/ML SOLN injection Inject 60 mg into the skin every 6 (six) months. Given in May and Nov. - Administer in upper arm, thigh, or abdomen  . fluticasone (FLONASE) 50 MCG/ACT nasal spray Place 2 sprays into both nostrils 2 (two) times daily.  . Multiple Vitamin (MULTIVITAMIN WITH MINERALS) TABS Take 1 tablet by mouth daily.  . Multiple Vitamins-Minerals (PRESERVISION AREDS 2) CAPS Take by mouth.  Marland Kitchen omeprazole (PRILOSEC) 20 MG capsule Take 1 capsule (20 mg total) by mouth daily.  Marland Kitchen Respiratory Therapy Supplies (FLUTTER) DEVI Use as directed                   Objective:   Physical Exam  Robust amb wf nad / looks < stated age   12/02/2017       114  08/19/2017       113   07/01/17 113 lb 12.8 oz (51.6 kg)  03/17/17 118 lb 9.6 oz (53.8 kg)  09/04/15 124 lb 3.2 oz (56.3 kg)     Vital signs reviewed - Note on arrival 02 sats  96% on RA      HEENT: nl dentition, turbinates bilaterally, and oropharynx. Nl external ear canals without cough reflex   NECK :  without JVD/Nodes/TM/ nl carotid upstrokes bilaterally   LUNGS: no acc muscle use,  Nl contour chest  Min insp/exp rhonchi  bilaterally without cough on insp or exp maneuvers   CV:  RRR  no s3 or murmur or increase in P2, and no edema   ABD:  soft and nontender with nl inspiratory excursion in the supine position. No bruits or organomegaly appreciated, bowel sounds nl  MS:  Nl gait/ ext warm without deformities, calf tenderness, cyanosis or clubbing No obvious joint restrictions   SKIN: warm and dry without lesions    NEURO:  alert, approp, nl sensorium with  no motor or cerebellar  deficits apparent.        CXR PA and Lateral:   12/02/2017 :    I personally reviewed images and agree with radiology impression as follows:    Stable changes of COPD and lingular atelectasis/scarring      Assessment:

## 2017-12-03 ENCOUNTER — Encounter: Payer: Self-pay | Admitting: Internal Medicine

## 2017-12-03 NOTE — Assessment & Plan Note (Signed)
Allergy profile 07/01/17 >  Eos 0.0 /  IgE  < 2 RAST  - 07/01/2017 rec augmentin x 10 days then sinus CT done 07/20/2017 >No significant paranasal sinus disease. 07/21/17 Feno   24   Assoc between cough and gerd and bronchiectasis reviewed > not necessarily cause and effect but prudent to continue the bedblocks and diet and ppi at present levels

## 2017-12-03 NOTE — Progress Notes (Signed)
Spoke with pt and notified of results per Dr. Wert. Pt verbalized understanding and denied any questions. 

## 2017-12-03 NOTE — Assessment & Plan Note (Signed)
See CT chest 06/26/17 with lingular dz most prominent  - Started symbicort 80 2bid 07/21/17 > off ? When  - Spirometry 08/19/2017  FEV1 1.15 (72%)  Ratio 63  Classic curvature ? After symb?   There is only minimal airflow obstruction and there was clinically no change with symbicort so ok to leave off for now and just rx with mucienx/ flutter valve as I reviewed with her and her daughter today   Each maintenance medication was reviewed in detail including most importantly the difference between maintenance and as needed and under what circumstances the prns are to be used.  Please see AVS for specific  Instructions which are unique to this visit and I personally typed out  which were reviewed in detail in writing with the patient and a copy provided.    F/u can be q 6 months, sooner prn

## 2017-12-22 DIAGNOSIS — H401123 Primary open-angle glaucoma, left eye, severe stage: Secondary | ICD-10-CM | POA: Diagnosis not present

## 2018-02-19 ENCOUNTER — Ambulatory Visit: Payer: Medicare Other | Admitting: Internal Medicine

## 2018-03-01 ENCOUNTER — Ambulatory Visit (INDEPENDENT_AMBULATORY_CARE_PROVIDER_SITE_OTHER): Payer: Medicare Other | Admitting: Pulmonary Disease

## 2018-03-01 ENCOUNTER — Encounter: Payer: Self-pay | Admitting: Pulmonary Disease

## 2018-03-01 VITALS — BP 126/68 | HR 107 | Temp 98.2°F | Ht 65.0 in | Wt 108.2 lb

## 2018-03-01 DIAGNOSIS — J479 Bronchiectasis, uncomplicated: Secondary | ICD-10-CM | POA: Diagnosis not present

## 2018-03-01 DIAGNOSIS — R05 Cough: Secondary | ICD-10-CM | POA: Diagnosis not present

## 2018-03-01 DIAGNOSIS — R053 Chronic cough: Secondary | ICD-10-CM

## 2018-03-01 DIAGNOSIS — J309 Allergic rhinitis, unspecified: Secondary | ICD-10-CM

## 2018-03-01 MED ORDER — PREDNISONE 10 MG PO TABS: ORAL_TABLET | ORAL | 0 refills | Status: DC

## 2018-03-01 MED ORDER — DOXYCYCLINE HYCLATE 100 MG PO TABS: 100.0000 mg | ORAL_TABLET | Freq: Two times a day (BID) | ORAL | 0 refills | Status: DC

## 2018-03-01 NOTE — Assessment & Plan Note (Addendum)
Assessment: Not completely adherent to management of flutter valve use No recent antibiotic use over the last 3 months Increased sputum production Sputum is white to yellow-tinged Patient reporting increased cough Expiratory wheeze on exam today  Plan:  Doxycycline prescription day Prednisone prescription today Continue flutter valve use 2 times a day at least 10 breaths each time If patient is not improving she will need a sputum sample and chest x-ray at next office visit Continue Flonase as prescribed Continue omeprazole as prescribed

## 2018-03-01 NOTE — Assessment & Plan Note (Signed)
Assessment: Likely bronchiectatic flare  Plan: Doxycycline Prednisone today Continue omeprazole as prescribed Continue flutter valve use as prescribed Continue fluticasone nasal spray as prescribed

## 2018-03-01 NOTE — Progress Notes (Signed)
@Patient  ID: Mary Kim, female    DOB: 1923/02/01, 85 y.o.   MRN: [id number]  Chief Complaint  Patient presents with  . 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 Virgina Jock with chronic cough. Obstructive bronchiectasis.    Smoker/ Smoking History: never smoker  Maintenance:  None  Pt of: Dr. Melvyn Novas  03/01/2018  - Visit   85 year old female never smoker presenting to our office today for an acute visit.  Patient reports that she has had 1 week of worsening cough.  Patient reports that she does use her flutter valve but she is unsure how many breaths she is using a day.  Patient is maintained on omeprazole 20 mg daily but patient has significant concerns regarding her GERD and acid reflux diet which has limited what she has been eating.  Patient is lost 6 pounds since last office visit.  Patient reports that the cough has been productive and is slightly more discolored yellow now.  Patient also endorses increased sputum production over the past 2 to 3 weeks.  Patient denies wheezing.  Patient continues to use high-protein boost as a snack in between meals.     Tests:   07/21/2017-office spirometry- FVC 1.8 (84% predicted) ratio 63, FEV1 1.1 (72% predicted)   Imaging:  07/20/2017-CT maxillofacial-limited without contrast, no significant paraspinal paranasal sinus disease, maxillary sinuses are clear except for minimal mucosal thickening  Cardiac:   Labs:  07/01/2017-CBC with differential-WBC 11.4 07/01/2017-respiratory allergy panel-negative  Micro:   Chart Review:   FENO:  No results found for: NITRICOXIDE June/ 2019 >>> 24  FENO:  No results found for: NITRICOXIDE  PFT: No flowsheet data found.  Imaging: No results found.    Specialty Problems      Pulmonary Problems    Chronic cough    Allergy profile 07/01/17 >  Eos 0.0 /  IgE  < 2 RAST  - 07/01/2017 rec augmentin x 10 days then sinus CT done 07/20/2017 >No significant paranasal sinus disease. 07/21/17 Feno   24      Obstructive bronchiectasis (H[facility])    See CT chest 06/26/17 with lingular dz most prominent  - Started symbicort 80 2bid 07/21/17 > off ? When  - Spirometry 08/19/2017  FEV1 1.15 (72%)  Ratio 63  Classic curvature ? After symb?          Allergic rhinitis      No Known Allergies  Immunization History  Administered Date(s) Administered  . Influenza, High Dose Seasonal PF 10/30/2017  . Influenza-Unspecified 10/31/2016   Patient reports that she is received her pneumonia vaccines before  Past Medical History:  Diagnosis Date  . Elevated cholesterol   . Hearing loss   . Hyperlipidemia   . Hypertension   . Osteoporosis     Tobacco History: Social History   Tobacco Use  Smoking Status Never Smoker  Smokeless Tobacco Never Used   Counseling given: Yes  Continue to not smoke  Outpatient Encounter Medications as of 03/01/2018  Medication Sig  . amLODipine (NORVASC) 10 MG tablet Take 10 mg by mouth daily.  Marland Kitchen aspirin 81 MG tablet Take 81 mg by mouth daily.  Marland Kitchen atorvastatin (LIPITOR) 20 MG tablet Take 20 mg by mouth daily.  . Calcium  Carbonate-Vitamin D (CALTRATE 600+D) 600-400 MG-UNIT per tablet Take 1 tablet by mouth daily.  . cholecalciferol (VITAMIN D) 1000 UNITS tablet Take 1,000 Units by mouth daily.  Marland Kitchen denosumab (PROLIA) 60 MG/ML SOLN injection Inject 60 mg into the skin every 6 (six) months. Given in May and Nov. - Administer in upper arm, thigh, or abdomen  . fluticasone (FLONASE) 50 MCG/ACT nasal spray Place 2 sprays into both nostrils 2 (two) times daily.  . Multiple Vitamin (MULTIVITAMIN WITH MINERALS) TABS Take 1 tablet by mouth daily.  . Multiple Vitamins-Minerals (PRESERVISION AREDS 2) CAPS Take by mouth.  Marland Kitchen omeprazole (PRILOSEC) 20 MG capsule Take 1 capsule (20 mg  total) by mouth daily.  Marland Kitchen OVER THE COUNTER MEDICATION Take 1 tablet by mouth daily. Called mucus relief, generic of mucinex dm  . Respiratory Therapy Supplies (FLUTTER) DEVI Use as directed  . doxycycline (VIBRA-TABS) 100 MG tablet Take 1 tablet (100 mg total) by mouth 2 (two) times daily.  . predniSONE (DELTASONE) 10 MG tablet Take 2 tablets (20mg  total) daily for the next 5 days. Take in the AM with food.   No facility-administered encounter medications on file as of 03/01/2018.      Review of Systems  Review of Systems  Constitutional: Positive for fatigue. Negative for fever.  HENT: Positive for congestion. Negative for sinus pressure and sinus pain.   Respiratory: Positive for cough and wheezing.   Cardiovascular: Negative for chest pain and palpitations.  Gastrointestinal: Negative for diarrhea, nausea and vomiting.       Denies acid reflux      Physical Exam  BP 126/68 (BP Location: Left Arm, Cuff Size: Normal)   Pulse (!) 107   Temp 98.2 F (36.8 C) (Oral)   Ht 5\' 5"  (1.651 m)   Wt 108 lb 3.2 oz (49.1 kg)   SpO2 96%   BMI 18.01 kg/m   Wt Readings from Last 5 Encounters:  03/01/18 108 lb 3.2 oz (49.1 kg)  12/02/17 114 lb (51.7 kg)  11/10/17 113 lb 3.2 oz (51.3 kg)  09/15/17 118 lb 9.6 oz (53.8 kg)  08/19/17 113 lb 9.6 oz (51.5 kg)     Physical Exam  Constitutional: She is oriented to person, place, and time and well-developed, well-nourished, and in no distress. No distress.  + Elderly frail female  HENT:  Head: Normocephalic and atraumatic.  Right Ear: Hearing, tympanic membrane, external ear and ear canal normal.  Left Ear: Hearing, tympanic membrane, external ear and ear canal normal.  Nose: Nose normal. Right sinus exhibits no maxillary sinus tenderness and no frontal sinus tenderness. Left sinus exhibits no maxillary sinus tenderness and no frontal sinus tenderness.  Mouth/Throat: Uvula is midline and oropharynx is clear and moist. No oropharyngeal exudate.   Eyes: Pupils are equal, round, and reactive to light.  Neck: Normal range of motion. Neck supple. No JVD present.  Cardiovascular: Normal rate, regular rhythm and normal heart sounds.  Pulmonary/Chest: Effort normal. No accessory muscle usage. No respiratory distress. She has no decreased breath sounds. She has wheezes (exp wheeze). She has no rhonchi.  Abdominal: Soft. Bowel sounds are normal. There is no abdominal tenderness.  Musculoskeletal: Normal range of motion.        General: No edema.  Lymphadenopathy:    She has no cervical adenopathy.  Neurological: She is alert and oriented to person, place, and time. Gait normal.  Skin: Skin is warm and dry. She is not diaphoretic. No erythema.  Psychiatric: Mood, memory, affect  and judgment normal.  Nursing note and vitals reviewed.     Lab Results:  CBC    Component Value Date/Time   WBC 11.4 (H) 07/01/2017 1056   RBC 3.92 07/01/2017 1056   HGB 12.9 07/01/2017 1056   HCT 38.5 07/01/2017 1056   PLT 244.0 07/01/2017 1056   MCV 98.2 07/01/2017 1056   MCH 32.4 12/15/2011 1003   MCHC 33.6 07/01/2017 1056   RDW 13.4 07/01/2017 1056   LYMPHSABS 6.2 (H) 07/01/2017 1056   MONOABS 0.4 07/01/2017 1056   EOSABS 0.0 07/01/2017 1056   BASOSABS 0.1 07/01/2017 1056    BMET    Component Value Date/Time   NA 139 12/15/2011 1003   K 4.4 12/15/2011 1003   CL 103 12/15/2011 1003   CO2 28 12/15/2011 1003   GLUCOSE 100 (H) 12/15/2011 1003   BUN 19 12/15/2011 1003   CREATININE 1.02 12/15/2011 1003   CALCIUM 9.4 12/15/2011 1003   GFRNONAA 48 (L) 12/15/2011 1003   GFRAA 56 (L) 12/15/2011 1003    BNP No results found for: BNP  ProBNP No results found for: PROBNP    Assessment & Plan:     Obstructive bronchiectasis ([place]) Assessment: Not completely adherent to management of flutter valve use No recent antibiotic use over the last 3 months Increased sputum production Sputum is white to yellow-tinged Patient reporting increased  cough Expiratory wheeze on exam today  Plan:  Doxycycline prescription day Prednisone prescription today Continue flutter valve use 2 times a day at least 10 breaths each time Continue Flonase as prescribed Continue omeprazole as prescribed   Allergic rhinitis Plan: Continue Flonase as prescribed  Chronic cough Assessment: Likely bronchiectatic flare  Plan: Doxycycline Prednisone today Continue omeprazole as prescribed Continue flutter valve use as prescribed Continue fluticasone nasal spray as prescribed     Lauraine Rinne, NP 03/01/2018   This appointment was 26 min long with over 50% of the time in direct face-to-face patient care, assessment, plan of care, and follow-up.

## 2018-03-01 NOTE — Patient Instructions (Addendum)
Doxycycline >>> 1 100 mg tablet every 12 hours for 7 days >>>take with food  >>>wear sunscreen   Prednisone 10mg  tablet  >>>Take 2 tablets (20 mg total) daily for the next 5 days >>> Take with food in the morning   Bronchiectasis: This is the medical term which indicates that you have damage, dilated airways making you more susceptible to respiratory infection. Use a flutter valve 10 breaths twice a day or 4 to 5 breaths 4-5 times a day to help clear mucus out Let us know if you have cough with change in mucus color or fevers or chills.  At that point you would need an antibiotic. Maintain a healthy nutritious diet, eating whole foods Take your medications as prescribed   Continue boost daily   Keep follow up with Dr. Melvyn Novas in May/2020  It is flu season:   >>>Remember to be washing your hands regularly, using hand sanitizer, be careful to use around herself with has contact with people who are sick will increase her chances of getting sick yourself. >>> Best ways to protect herself from the flu: Receive the yearly flu vaccine, practice good hand hygiene washing with soap and also using hand sanitizer when available, eat a nutritious meals, get adequate rest, hydrate appropriately   Please contact the office if your symptoms worsen or you have concerns that you are not improving.   Thank you for choosing [facility] for your healthcare, and for allowing [place] to partner with you on your healthcare journey. I am thankful to be able to provide care to you today.   [name] FNP-C

## 2018-03-01 NOTE — Assessment & Plan Note (Signed)
Plan: Continue Flonase as prescribed

## 2018-03-08 NOTE — Progress Notes (Signed)
Chart and office note reviewed in detail  > agree with a/p as outlined    

## 2018-03-18 ENCOUNTER — Ambulatory Visit (HOSPITAL_COMMUNITY): Payer: Medicare Other

## 2018-03-23 ENCOUNTER — Ambulatory Visit (INDEPENDENT_AMBULATORY_CARE_PROVIDER_SITE_OTHER): Payer: Medicare Other | Admitting: Pulmonary Disease

## 2018-03-23 ENCOUNTER — Ambulatory Visit (INDEPENDENT_AMBULATORY_CARE_PROVIDER_SITE_OTHER)
Admission: RE | Admit: 2018-03-23 | Discharge: 2018-03-23 | Disposition: A | Discharge status: Home / Self Care | Payer: Medicare Other | Source: Ambulatory Visit | Attending: Pulmonary Disease | Admitting: Pulmonary Disease

## 2018-03-23 ENCOUNTER — Encounter: Payer: Self-pay | Admitting: Pulmonary Disease

## 2018-03-23 VITALS — BP 120/70 | HR 97 | Temp 97.6°F | Ht 65.0 in | Wt 108.6 lb

## 2018-03-23 DIAGNOSIS — R05 Cough: Secondary | ICD-10-CM | POA: Diagnosis not present

## 2018-03-23 DIAGNOSIS — J309 Allergic rhinitis, unspecified: Secondary | ICD-10-CM

## 2018-03-23 DIAGNOSIS — J479 Bronchiectasis, uncomplicated: Secondary | ICD-10-CM | POA: Diagnosis not present

## 2018-03-23 DIAGNOSIS — R42 Dizziness and giddiness: Secondary | ICD-10-CM | POA: Diagnosis not present

## 2018-03-23 DIAGNOSIS — R053 Chronic cough: Secondary | ICD-10-CM

## 2018-03-23 MED ORDER — SODIUM CHLORIDE 3 % IN NEBU: INHALATION_SOLUTION | RESPIRATORY_TRACT | 12 refills | Status: DC

## 2018-03-23 MED ORDER — BUDESONIDE-FORMOTEROL FUMARATE 80-4.5 MCG/ACT IN AERO: 2.0000 | INHALATION_SPRAY | Freq: Two times a day (BID) | RESPIRATORY_TRACT | 0 refills | Status: DC

## 2018-03-23 NOTE — Patient Instructions (Addendum)
Chest Xray today   Orthostatics today  >>>these were stable  Restart Symbicort 80 >>> 2 puffs in the morning right when you wake up, rinse out your mouth after use, 12 hours later 2 puffs, rinse after use >>> Take this daily, no matter what >>> This is not a rescue inhaler   Start Hypertonic Saline nebulized medications twice a day >>>then use Flutter Valve   Bronchiectasis: This is the medical term which indicates that you have damage, dilated airways making you more susceptible to respiratory infection. Use a flutter valve 10 breaths twice a day or 4 to 5 breaths 4-5 times a day to help clear mucus out >>>Can increase to 3 times daily  Let us know if you have cough with change in mucus color or fevers or chills.  At that point you would need an antibiotic. Maintain a healthy nutritious diet, eating whole foods Take your medications as prescribed   Continue boost daily   Omeprazole 20 mg tablet  >>>Please take 1 tablet daily 15 minutes to 30 minutes before your first meal of the day as well as before your other medications >>>Try to take at the same time each day >>>take this medication daily  Follow up in 4 weeks with Dr. Melvyn Novas or if no availability then [name] FNP   It is flu season:   >>> Best ways to protect herself from the flu: Receive the yearly flu vaccine, practice good hand hygiene washing with soap and also using hand sanitizer when available, eat a nutritious meals, get adequate rest, hydrate appropriately   Please contact the office if your symptoms worsen or you have concerns that you are not improving.   Thank you for choosing [facility] for your healthcare, and for allowing [place] to partner with you on your healthcare journey. I am thankful to be able to provide care to you today.   [name] FNP-C

## 2018-03-23 NOTE — Progress Notes (Signed)
@Patient  ID: Mary Kim, female    DOB: 1923-03-29, 85 y.o.   MRN: [id number]  Chief Complaint  Patient presents with  . 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 Virgina Jock with chronic cough. Obstructive bronchiectasis.    Smoker/ Smoking History: never smoker  Maintenance:  None  Pt of: Dr. Melvyn Novas  03/23/2018  - Visit   85 year old female never smoker presenting to office today for an acute visit.  Patient reporting that she is having exacerbation of her chronic cough.  Patient with known obstructive bronchiectasis.  Patient reports that after last office visit in the beginning of February after she was treated with doxycycline prednisone she had minor improvement.  Patient reports that over the last week the cough is come back and is typically worse in the morning as well as productive with white mucus.  Patient reports that her breathing improves after using her flutter valve which she is adherent to using twice daily.  Patient also is adherent to daily Mucinex.  Patient reports adherence to her Prilosec as well as nasal saline rinses and Flonase.  Patient also reports that on 03/21/2018 she had an episode of dizziness where her equilibrium felt off.  Patient reports that this dizziness typically occurred when changing positions or standing.  Patient has had no recent falls.  Patient has not followed up with primary care regarding this.   Tests:   07/21/2017-office spirometry- FVC 1.8 (84% predicted) ratio 63, FEV1 1.1 (72% predicted)   Imaging:  06/26/2017-CT chest-lingular disease showing obstructive bronchiectasis  07/20/2017-CT maxillofacial-limited without contrast, no significant paraspinal paranasal sinus disease, maxillary sinuses are clear except for minimal mucosal thickening  Cardiac:   Labs:  07/01/2017-CBC  with differential-WBC 11.4 07/01/2017-respiratory allergy panel-negative  FENO:  No results found for: NITRICOXIDE  PFT: No flowsheet data found.  Imaging: No results found.    Specialty Problems      Pulmonary Problems   Chronic cough    Allergy profile 07/01/17 >  Eos 0.0 /  IgE  < 2 RAST  - 07/01/2017 rec augmentin x 10 days then sinus CT done 07/20/2017 >No significant paranasal sinus disease. 07/21/17 Feno   24      Obstructive bronchiectasis (HCC)    See CT chest 06/26/17 with lingular dz most prominent  - Started symbicort 80 2bid 07/21/17 > off ? When  - Spirometry 08/19/2017  FEV1 1.15 (72%)  Ratio 63  Classic curvature ? After symb?          Allergic rhinitis      No Known Allergies  Immunization History  Administered Date(s) Administered  . Influenza, High Dose Seasonal PF 10/30/2017  . Influenza-Unspecified 10/31/2016    Past Medical History:  Diagnosis Date  . Elevated cholesterol   . Hearing loss   . Hyperlipidemia   . Hypertension   . Osteoporosis     Tobacco History: Social History   Tobacco Use  Smoking Status Never Smoker  Smokeless Tobacco Never Used   Counseling given: Yes  Continue to not smoke  Outpatient Encounter Medications as of 03/23/2018  Medication Sig  . amLODipine (NORVASC) 10 MG tablet Take 10 mg by mouth daily.  Marland Kitchen aspirin 81 MG tablet Take 81 mg by mouth daily.  Marland Kitchen atorvastatin (LIPITOR) 20  MG tablet Take 20 mg by mouth daily.  . Calcium Carbonate-Vitamin D (CALTRATE 600+D) 600-400 MG-UNIT per tablet Take 1 tablet by mouth daily.  . cholecalciferol (VITAMIN D) 1000 UNITS tablet Take 1,000 Units by mouth daily.  Marland Kitchen denosumab (PROLIA) 60 MG/ML SOLN injection Inject 60 mg into the skin every 6 (six) months. Given in May and Nov. - Administer in upper arm, thigh, or abdomen  . fluticasone (FLONASE) 50 MCG/ACT nasal spray Place 2 sprays into both nostrils 2 (two) times daily.  . Multiple Vitamin (MULTIVITAMIN WITH MINERALS) TABS  Take 1 tablet by mouth daily.  . Multiple Vitamins-Minerals (PRESERVISION AREDS 2) CAPS Take by mouth.  Marland Kitchen omeprazole (PRILOSEC) 20 MG capsule Take 1 capsule (20 mg total) by mouth daily.  Marland Kitchen OVER THE COUNTER MEDICATION Take 1 tablet by mouth daily. Called mucus relief, generic of mucinex dm  . Respiratory Therapy Supplies (FLUTTER) DEVI Use as directed  . budesonide-formoterol (SYMBICORT) 80-4.5 MCG/ACT inhaler Inhale 2 puffs into the lungs 2 (two) times daily.  . sodium chloride HYPERTONIC 3 % nebulizer solution Take two times daily prior to Flutter Valve use  . [DISCONTINUED] doxycycline (VIBRA-TABS) 100 MG tablet Take 1 tablet (100 mg total) by mouth 2 (two) times daily. (Patient not taking: Reported on 03/23/2018)  . [DISCONTINUED] predniSONE (DELTASONE) 10 MG tablet Take 2 tablets (20mg  total) daily for the next 5 days. Take in the AM with food. (Patient not taking: Reported on 03/23/2018)   No facility-administered encounter medications on file as of 03/23/2018.      Review of Systems  Review of Systems  Constitutional: Positive for fatigue. Negative for chills, fever and unexpected weight change.  HENT: Positive for congestion (clear drainage if anything ). Negative for ear pain, postnasal drip, sinus pressure and sinus pain.   Respiratory: Positive for cough (productive, more in the morning) and wheezing. Negative for chest tightness and shortness of breath.   Cardiovascular: Negative for chest pain and palpitations.  Gastrointestinal: Negative for diarrhea, nausea and vomiting.  Musculoskeletal: Positive for gait problem (sunday - balance ). Negative for arthralgias.  Skin: Negative for color change.  Allergic/Immunologic: Negative for environmental allergies and food allergies.  Neurological: Positive for light-headedness (sunday ). Negative for dizziness and headaches.  Psychiatric/Behavioral: Negative for dysphoric mood. The patient is not nervous/anxious.   All other systems  reviewed and are negative.    Physical Exam  BP 120/70 (BP Location: Left Arm, Patient Position: Standing, Cuff Size: Normal)   Pulse 97   Temp 97.6 F (36.4 C) (Oral)   Ht 5\' 5"  (1.651 m)   Wt 108 lb 9.6 oz (49.3 kg)   SpO2 98%   BMI 18.07 kg/m   See vital signs tab for orthostatic results  Wt Readings from Last 5 Encounters:  03/23/18 108 lb 9.6 oz (49.3 kg)  03/01/18 108 lb 3.2 oz (49.1 kg)  12/02/17 114 lb (51.7 kg)  11/10/17 113 lb 3.2 oz (51.3 kg)  09/15/17 118 lb 9.6 oz (53.8 kg)    Physical Exam  Constitutional: She is oriented to person, place, and time and well-developed, well-nourished, and in no distress. No distress.  Thin elderly female  HENT:  Head: Normocephalic and atraumatic.  Right Ear: Hearing, tympanic membrane, external ear and ear canal normal.  Left Ear: Hearing, tympanic membrane, external ear and ear canal normal.  Nose: Mucosal edema present. Right sinus exhibits no maxillary sinus tenderness and no frontal sinus tenderness. Left sinus exhibits no maxillary sinus tenderness and  no frontal sinus tenderness.  Mouth/Throat: Uvula is midline and oropharynx is clear and moist. She has dentures. No oropharyngeal exudate.  Left nare bleeding Postnasal drip  Eyes: Pupils are equal, round, and reactive to light.  Neck: Normal range of motion. Neck supple.  Cardiovascular: Normal rate, regular rhythm and normal heart sounds.  Pulmonary/Chest: Effort normal and breath sounds normal. No accessory muscle usage. No respiratory distress. She has no decreased breath sounds. She has no wheezes. She has no rhonchi.  Musculoskeletal: Normal range of motion.        General: No edema.  Lymphadenopathy:    She has no cervical adenopathy.  Neurological: She is alert and oriented to person, place, and time. Gait normal.  Skin: Skin is warm and dry. She is not diaphoretic. No erythema.  Psychiatric: Mood, memory, affect and judgment normal.  Nursing note and vitals  reviewed.     Lab Results:  CBC    Component Value Date/Time   WBC 11.4 (H) 07/01/2017 1056   RBC 3.92 07/01/2017 1056   HGB 12.9 07/01/2017 1056   HCT 38.5 07/01/2017 1056   PLT 244.0 07/01/2017 1056   MCV 98.2 07/01/2017 1056   MCH 32.4 12/15/2011 1003   MCHC 33.6 07/01/2017 1056   RDW 13.4 07/01/2017 1056   LYMPHSABS 6.2 (H) 07/01/2017 1056   MONOABS 0.4 07/01/2017 1056   EOSABS 0.0 07/01/2017 1056   BASOSABS 0.1 07/01/2017 1056    BMET    Component Value Date/Time   NA 139 12/15/2011 1003   K 4.4 12/15/2011 1003   CL 103 12/15/2011 1003   CO2 28 12/15/2011 1003   GLUCOSE 100 (H) 12/15/2011 1003   BUN 19 12/15/2011 1003   CREATININE 1.02 12/15/2011 1003   CALCIUM 9.4 12/15/2011 1003   GFRNONAA 48 (L) 12/15/2011 1003   GFRAA 56 (L) 12/15/2011 1003    BNP No results found for: BNP  ProBNP No results found for: PROBNP    Assessment & Plan:     Obstructive bronchiectasis ([place]) Assessment: Patient reporting adherence to flutter valve use 2 times daily for a total of 20 breaths daily Patient endorsing increased morning cough with white sputum Lungs clear to auscultation today BMI 18 Patient reporting improved diet since last office visit  Plan: Chest x-ray today Continue flutter valve use can increase to 3 times daily Hypertonic saline nebs to be used twice a day We will start trial of Symbicort 80 Continue Flonase Continue omeprazole Follow-up in 4 weeks  Allergic rhinitis Plan: Continue Flonase as prescribed Continue nasal saline rinses as prescribed  Chronic cough Assessment: Previously patient was managed on Symbicort 80 patient is unsure when this was stopped Known obstructive bronchiectasis BMI 18 Lungs clear to auscultation today  Plan: Chest x-ray today Increase flutter valve use to 3 times daily Add hypertonic saline nebs Trial of Symbicort 80 Continue Flonase as prescribed Continue omeprazole as prescribed Follow-up in 4  weeks  Dizziness Assessment: Patient reporting occasional dizziness over the past 3 days Patient reports that this typically happens when changing positions  Plan: Orthostatics today are stable Use assistive devices as prescribed to prevent falls Continue to monitor symptoms If symptoms worsen proceed to follow-up with primary care     Lauraine Rinne, NP 03/23/2018   This appointment was 28 min long with over 50% of the time in direct face-to-face patient care, assessment, plan of care, and follow-up.

## 2018-03-23 NOTE — Progress Notes (Signed)
Patient seen in the office today and instructed on use of symbicort inhaler.  Patient expressed understanding and demonstrated technique.  

## 2018-03-23 NOTE — Progress Notes (Signed)
Your chest x-ray results of come back.  Showing no acute changes.  No plan of care changes at this time.  Keep follow-up appointment.    Follow-up with our office if symptoms worsen or you do not feel like you are improving under her current regimen.  It was a pleasure taking care of you,  Brian Mack, FNP 

## 2018-03-23 NOTE — Assessment & Plan Note (Signed)
Assessment: Patient reporting occasional dizziness over the past 3 days Patient reports that this typically happens when changing positions  Plan: Orthostatics today are stable Use assistive devices as prescribed to prevent falls Continue to monitor symptoms If symptoms worsen proceed to follow-up with primary care

## 2018-03-23 NOTE — Assessment & Plan Note (Signed)
Plan: Continue Flonase as prescribed Continue nasal saline rinses as prescribed

## 2018-03-23 NOTE — Assessment & Plan Note (Addendum)
Assessment: Patient reporting adherence to flutter valve use 2 times daily for a total of 20 breaths daily Patient endorsing increased morning cough with white sputum Lungs clear to auscultation today BMI 18 Patient reporting improved diet since last office visit  Plan: Chest x-ray today Continue flutter valve use can increase to 3 times daily Hypertonic saline nebs to be used twice a day We will start trial of Symbicort 80 Continue Flonase Continue omeprazole Follow-up in 4 weeks

## 2018-03-23 NOTE — Assessment & Plan Note (Signed)
Assessment: Previously patient was managed on Symbicort 80 patient is unsure when this was stopped Known obstructive bronchiectasis BMI 18 Lungs clear to auscultation today  Plan: Chest x-ray today Increase flutter valve use to 3 times daily Add hypertonic saline nebs Trial of Symbicort 80 Continue Flonase as prescribed Continue omeprazole as prescribed Follow-up in 4 weeks

## 2018-03-24 ENCOUNTER — Telehealth: Payer: Self-pay | Admitting: Pulmonary Disease

## 2018-03-24 DIAGNOSIS — J479 Bronchiectasis, uncomplicated: Secondary | ICD-10-CM

## 2018-03-24 NOTE — Telephone Encounter (Signed)
Spoke with pt daughter [name] (DRP).  Pt needs a nebulizer machine for hypertonic nebs.  Is that ok to order Dr Melvyn Novas?

## 2018-03-24 NOTE — Progress Notes (Signed)
Agreed. Please place order for neb machine and notify the patient tomm. Place to patient preferred DME. If no preference we can see if [facility] would deliver the nebulizer to the patient.  [name] FNP

## 2018-03-24 NOTE — Progress Notes (Signed)
Please see note below. Please call the patient.   Mary Kim

## 2018-03-24 NOTE — Progress Notes (Signed)
Chart and office note reviewed in detail  > agree with a/p as outlined    

## 2018-03-25 NOTE — Telephone Encounter (Signed)
Spoke with pt's daughter and advised her that I would send in the nebulizer order to a DME. They do not currently have a DME. Daughter understood and nothing further is needed.

## 2018-03-25 NOTE — Progress Notes (Signed)
Can we ensure that the patient has had a nebulizer ordered for her.  I am not seeing a response from triage regarding this.  [name], FNP

## 2018-03-25 NOTE — Telephone Encounter (Signed)
That's fine

## 2018-03-29 NOTE — Progress Notes (Signed)
Thank you Lattie Haw.  Aaron Edelman

## 2018-04-21 ENCOUNTER — Ambulatory Visit (HOSPITAL_COMMUNITY): Payer: Medicare Other

## 2018-04-21 ENCOUNTER — Ambulatory Visit: Payer: Medicare Other | Admitting: Internal Medicine

## 2018-04-29 DIAGNOSIS — D692 Other nonthrombocytopenic purpura: Secondary | ICD-10-CM | POA: Diagnosis not present

## 2018-04-29 DIAGNOSIS — N183 Chronic kidney disease, stage 3 (moderate): Secondary | ICD-10-CM | POA: Diagnosis not present

## 2018-04-29 DIAGNOSIS — M810 Age-related osteoporosis without current pathological fracture: Secondary | ICD-10-CM | POA: Diagnosis not present

## 2018-04-29 DIAGNOSIS — J479 Bronchiectasis, uncomplicated: Secondary | ICD-10-CM | POA: Diagnosis not present

## 2018-04-29 DIAGNOSIS — E46 Unspecified protein-calorie malnutrition: Secondary | ICD-10-CM | POA: Diagnosis not present

## 2018-04-29 DIAGNOSIS — I129 Hypertensive chronic kidney disease with stage 1 through stage 4 chronic kidney disease, or unspecified chronic kidney disease: Secondary | ICD-10-CM | POA: Diagnosis not present

## 2018-04-29 DIAGNOSIS — R634 Abnormal weight loss: Secondary | ICD-10-CM | POA: Diagnosis not present

## 2018-05-05 DIAGNOSIS — N183 Chronic kidney disease, stage 3 (moderate): Secondary | ICD-10-CM | POA: Diagnosis not present

## 2018-05-05 DIAGNOSIS — M25512 Pain in left shoulder: Secondary | ICD-10-CM | POA: Diagnosis not present

## 2018-05-05 DIAGNOSIS — I129 Hypertensive chronic kidney disease with stage 1 through stage 4 chronic kidney disease, or unspecified chronic kidney disease: Secondary | ICD-10-CM | POA: Diagnosis not present

## 2018-05-05 DIAGNOSIS — W19XXXA Unspecified fall, initial encounter: Secondary | ICD-10-CM | POA: Diagnosis not present

## 2018-05-05 DIAGNOSIS — R0781 Pleurodynia: Secondary | ICD-10-CM | POA: Diagnosis not present

## 2018-05-14 ENCOUNTER — Other Ambulatory Visit: Payer: Self-pay | Admitting: Internal Medicine

## 2018-05-14 ENCOUNTER — Ambulatory Visit
Admission: RE | Admit: 2018-05-14 | Discharge: 2018-05-14 | Disposition: A | Discharge status: Home / Self Care | Payer: Medicare Other | Source: Ambulatory Visit | Attending: Internal Medicine | Admitting: Internal Medicine

## 2018-05-14 ENCOUNTER — Other Ambulatory Visit: Payer: Self-pay

## 2018-05-14 DIAGNOSIS — M25512 Pain in left shoulder: Principal | ICD-10-CM

## 2018-05-14 DIAGNOSIS — G8929 Other chronic pain: Secondary | ICD-10-CM

## 2018-05-20 ENCOUNTER — Encounter (HOSPITAL_COMMUNITY): Payer: Medicare Other

## 2018-05-20 ENCOUNTER — Ambulatory Visit (HOSPITAL_COMMUNITY)
Admission: RE | Admit: 2018-05-20 | Discharge: 2018-05-20 | Disposition: A | Discharge status: Home / Self Care | Payer: Medicare Other | Source: Ambulatory Visit | Attending: Internal Medicine | Admitting: Internal Medicine

## 2018-05-20 ENCOUNTER — Encounter (HOSPITAL_COMMUNITY): Payer: Self-pay

## 2018-05-20 ENCOUNTER — Other Ambulatory Visit: Payer: Self-pay

## 2018-05-20 ENCOUNTER — Ambulatory Visit (HOSPITAL_COMMUNITY): Payer: Medicare Other

## 2018-05-20 DIAGNOSIS — M810 Age-related osteoporosis without current pathological fracture: Secondary | ICD-10-CM | POA: Insufficient documentation

## 2018-05-20 MED ORDER — DENOSUMAB 60 MG/ML ~~LOC~~ SOSY
60.0000 mg | PREFILLED_SYRINGE | Freq: Once | SUBCUTANEOUS | Status: AC
  Administered 2018-05-20: 60 mg via SUBCUTANEOUS
  Filled 2018-05-20: qty 1

## 2018-05-20 NOTE — Discharge Instructions (Signed)
Denosumab injection °What is this medicine? °DENOSUMAB (den oh sue mab) slows bone breakdown. Prolia is used to treat osteoporosis in women after menopause and in men, and in people who are taking corticosteroids for 6 months or more. Xgeva is used to treat a high calcium level due to cancer and to prevent bone fractures and other bone problems caused by multiple myeloma or cancer bone metastases. Xgeva is also used to treat giant cell tumor of the bone. °This medicine may be used for other purposes; ask your health care provider or pharmacist if you have questions. °COMMON BRAND NAME(S): Prolia, XGEVA °What should I tell my health care provider before I take this medicine? °They need to know if you have any of these conditions: °-dental disease °-having surgery or tooth extraction °-infection °-kidney disease °-low levels of calcium or Vitamin D in the blood °-malnutrition °-on hemodialysis °-skin conditions or sensitivity °-thyroid or parathyroid disease °-an unusual reaction to denosumab, other medicines, foods, dyes, or preservatives °-pregnant or trying to get pregnant °-breast-feeding °How should I use this medicine? °This medicine is for injection under the skin. It is given by a health care professional in a hospital or clinic setting. °A special MedGuide will be given to you before each treatment. Be sure to read this information carefully each time. °For Prolia, talk to your pediatrician regarding the use of this medicine in children. Special care may be needed. For Xgeva, talk to your pediatrician regarding the use of this medicine in children. While this drug may be prescribed for children as young as 13 years for selected conditions, precautions do apply. °Overdosage: If you think you have taken too much of this medicine contact a poison control center or emergency room at once. °NOTE: This medicine is only for you. Do not share this medicine with others. °What if I miss a dose? °It is important not to  miss your dose. Call your doctor or health care professional if you are unable to keep an appointment. °What may interact with this medicine? °Do not take this medicine with any of the following medications: °-other medicines containing denosumab °This medicine may also interact with the following medications: °-medicines that lower your chance of fighting infection °-steroid medicines like prednisone or cortisone °This list may not describe all possible interactions. Give your health care provider a list of all the medicines, herbs, non-prescription drugs, or dietary supplements you use. Also tell them if you smoke, drink alcohol, or use illegal drugs. Some items may interact with your medicine. °What should I watch for while using this medicine? °Visit your doctor or health care professional for regular checks on your progress. Your doctor or health care professional may order blood tests and other tests to see how you are doing. °Call your doctor or health care professional for advice if you get a fever, chills or sore throat, or other symptoms of a cold or flu. Do not treat yourself. This drug may decrease your body's ability to fight infection. Try to avoid being around people who are sick. °You should make sure you get enough calcium and vitamin D while you are taking this medicine, unless your doctor tells you not to. Discuss the foods you eat and the vitamins you take with your health care professional. °See your dentist regularly. Brush and floss your teeth as directed. Before you have any dental work done, tell your dentist you are receiving this medicine. °Do not become pregnant while taking this medicine or for 5 months   after stopping it. Talk with your doctor or health care professional about your birth control options while taking this medicine. Women should inform their doctor if they wish to become pregnant or think they might be pregnant. There is a potential for serious side effects to an unborn  child. Talk to your health care professional or pharmacist for more information. °What side effects may I notice from receiving this medicine? °Side effects that you should report to your doctor or health care professional as soon as possible: °-allergic reactions like skin rash, itching or hives, swelling of the face, lips, or tongue °-bone pain °-breathing problems °-dizziness °-jaw pain, especially after dental work °-redness, blistering, peeling of the skin °-signs and symptoms of infection like fever or chills; cough; sore throat; pain or trouble passing urine °-signs of low calcium like fast heartbeat, muscle cramps or muscle pain; pain, tingling, numbness in the hands or feet; seizures °-unusual bleeding or bruising °-unusually weak or tired °Side effects that usually do not require medical attention (report to your doctor or health care professional if they continue or are bothersome): °-constipation °-diarrhea °-headache °-joint pain °-loss of appetite °-muscle pain °-runny nose °-tiredness °-upset stomach °This list may not describe all possible side effects. Call your doctor for medical advice about side effects. You may report side effects to FDA at 1-800-FDA-1088. °Where should I keep my medicine? °This medicine is only given in a clinic, doctor's office, or other health care setting and will not be stored at home. °NOTE: This sheet is a summary. It may not cover all possible information. If you have questions about this medicine, talk to your doctor, pharmacist, or health care provider. °© 2019 [employer] (2017-05-22 16:10:44) ° °

## 2018-06-04 ENCOUNTER — Ambulatory Visit: Payer: Medicare Other | Admitting: Internal Medicine

## 2018-07-01 ENCOUNTER — Other Ambulatory Visit: Payer: Self-pay | Admitting: Internal Medicine

## 2018-07-01 DIAGNOSIS — G4452 New daily persistent headache (NDPH): Secondary | ICD-10-CM

## 2018-07-07 ENCOUNTER — Ambulatory Visit
Admission: RE | Admit: 2018-07-07 | Discharge: 2018-07-07 | Disposition: A | Discharge status: Home / Self Care | Payer: Medicare Other | Source: Ambulatory Visit | Attending: Internal Medicine | Admitting: Internal Medicine

## 2018-07-07 ENCOUNTER — Other Ambulatory Visit: Payer: Self-pay

## 2018-07-07 DIAGNOSIS — R51 Headache: Secondary | ICD-10-CM | POA: Diagnosis not present

## 2018-07-07 DIAGNOSIS — G4452 New daily persistent headache (NDPH): Secondary | ICD-10-CM

## 2018-07-07 DIAGNOSIS — H401123 Primary open-angle glaucoma, left eye, severe stage: Secondary | ICD-10-CM | POA: Diagnosis not present

## 2018-07-21 ENCOUNTER — Other Ambulatory Visit: Payer: Self-pay

## 2018-07-21 ENCOUNTER — Encounter: Payer: Self-pay | Admitting: Internal Medicine

## 2018-07-21 ENCOUNTER — Ambulatory Visit (INDEPENDENT_AMBULATORY_CARE_PROVIDER_SITE_OTHER): Payer: Medicare Other | Admitting: Internal Medicine

## 2018-07-21 DIAGNOSIS — J479 Bronchiectasis, uncomplicated: Secondary | ICD-10-CM

## 2018-07-21 NOTE — Progress Notes (Signed)
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 Consult    cough x 6 months no improvement, discolored mucous, 2 rounds of antibiotics. Chest CT 5/30  started with a head cold before xmas 2018  "just like everybody else" and some better after abx then worse p several weeks p finished abx and about the same since despite additional coursed abx though does not know names  Mucus was mostly clear occ yellow min volume and no change off  abx x several months  Worse at hs then resolves s noct or am distubance or flare  rec Augmentin 875 mg take one pill twice daily  X 10 days - Prednisone 10 mg take  4 each am x 2 days,   2 each am x 2 days,  1 each am x 2 days and stop  For mucinex dm 1200 mg every 12 hours and use the flutter valve as much as you can  Try prilosec otc 20mg   Take 30-60 min before first meal of the day and Pepcid ac (famotidine) 20 mg one @  bedtime until cough is completely gone for at least a week without the need for cough suppression GERD (REFLUX)   If not better when you finish your augmentin you will need a CT sinus > neg 07/20/17   07/21/17 NP Eval/ Mary Kim Spirometry c/w mild obst > symb 80 2bid / zyrtec hs  08/19/2017  f/u ov/Mary Kim re: obst bronchiectasis  Chief Complaint  Patient presents with  . Follow-up    Pt states she does not cough much. She only coughs after uses her flutter valve about 3 x per day- prod with clear sputum.    Dyspnea:  Not limited Cough: some hoarseness but cough is much better SABA use: none 02: none   rec No change > did not continue  symbicort or pepcid      12/02/2017  f/u ov/Mary Kim re: s/p bronchiectasis flare now off symbicort  And no change on vs off perceived  Chief Complaint  Patient presents with  . Follow-up    Cough is much improved. She produces clear sputum.   Dyspnea:  DR and back ok / [employer]  [place] leaning on cart  Cough: no am flare Using flutter  Prn  Sleeping: about 30 degrees with blocks / sleeps fine  SABA use: none 02: none rec If cough / congestion > mucinex max dose is 1200 mg every 12 hours and use the flutter valve as much as possible  GERD diet    03/23/2018 Restart Symbicort 80 >>> 2 puffs in the morning right when you wake up, rinse out your mouth after use, 12 hours later 2 puffs, rinse after use >>> Take this daily, no matter what >>> This is not a rescue inhaler   Start Hypertonic Saline nebulized medications twice a day >>>then use Flutter Valve     Virtual Visit via Telephone Note 07/21/2018   I connected with Mary Kim on 07/21/18 at [facility] by telephone  and verified that I am speaking with the correct person using two identifiers.   I discussed the limitations, risks, security and privacy concerns of performing an evaluation and management service by telephone and the availability of in person appointments. I also discussed with the patient that there may be a patient responsible charge related to this service. The patient expressed understanding and agreed to proceed.   History of Present Illness: re bronchiectasis  Dyspnea:  Not limited by breathing from desired activities  But very inactive Cough: p neb with hypertonic saline and flutter  Sleeping: ok raised up about 30 degrees with wedge  SABA use: none 02: none   No obvious day to day or daytime variability or assoc excess/ purulent sputum or mucus plugs or hemoptysis or cp or chest tightness, subjective wheeze or overt sinus or hb symptoms.    Also denies any obvious fluctuation of symptoms with weather or  environmental changes or other aggravating or alleviating factors except as outlined above.   Meds reviewed/ med reconciliation completed          Observations/Objective: Good spirits/ phonation / full sentences    Assessment and Plan: See problem list for active a/p's   Follow Up Instructions: See avs for instructions unique to this ov which includes revised/ updated med list     I discussed the assessment and treatment plan with the patient. The patient was provided an opportunity to ask questions and all were answered. The patient agreed with the plan and demonstrated an understanding of the instructions.   The patient was advised to call back or seek an in-person evaluation if the symptoms worsen or if the condition fails to improve as anticipated.  I provided 25  minutes of non-face-to-face time during this encounter.   Mary Gully, MD

## 2018-07-21 NOTE — Patient Instructions (Signed)
No change in therapy  Pt has Mychart so no paper copy needednon  Please schedule a follow up visit in 3 months but call sooner if needed

## 2018-07-21 NOTE — Assessment & Plan Note (Signed)
See CT chest 06/26/17 with lingular dz most prominent  - Started symbicort 80 2bid 07/21/17 > off ? When  - Spirometry 08/19/2017  FEV1 1.15 (72%)  Ratio 63  Classic curvature ? After symb?  - added hypertonic saline 03/23/18 and pt d/c'd symbicort    Doing well s the symbicort just using the hypertonic saline s flares or limiting sob so will leave well enough alone.  She is sheltering [facility]/ precautions reviewed and being followed   Each maintenance medication was reviewed in detail including most importantly the difference between maintenance and as needed and under what circumstances the prns are to be used.  Please see AVS for specific  Instructions which are unique to this visit and I personally typed out  which were reviewed in detail over the phone with the patient and a copy provided via mychart  F/u in 3 m with cxr assuming COVID - 19 restrictions have been lifted - o/w televist will do if she's doing well

## 2018-07-23 ENCOUNTER — Ambulatory Visit: Payer: Medicare Other | Admitting: Internal Medicine

## 2018-09-03 ENCOUNTER — Other Ambulatory Visit: Payer: Self-pay | Admitting: Internal Medicine

## 2018-09-03 DIAGNOSIS — Z1231 Encounter for screening mammogram for malignant neoplasm of breast: Secondary | ICD-10-CM

## 2018-10-01 ENCOUNTER — Other Ambulatory Visit: Payer: Self-pay

## 2018-10-01 ENCOUNTER — Encounter (HOSPITAL_COMMUNITY): Payer: Self-pay | Admitting: Emergency Medicine

## 2018-10-01 ENCOUNTER — Ambulatory Visit (HOSPITAL_COMMUNITY)
Admission: EM | Admit: 2018-10-01 | Discharge: 2018-10-01 | Disposition: A | Discharge status: Home / Self Care | Payer: Medicare Other | Attending: Family Medicine | Admitting: Family Medicine

## 2018-10-01 DIAGNOSIS — K115 Sialolithiasis: Secondary | ICD-10-CM

## 2018-10-01 NOTE — Discharge Instructions (Signed)
Drink more water Warm compresses to area 20 min every couple of hours Suck on sour candy like lemon drops Call ENT if not better by Tuesday Call here or return if you develop pain or fever

## 2018-10-01 NOTE — ED Triage Notes (Signed)
Pt states for the last week shes had lumps in her neck grow, large mass underneath chin, nontender to palpation. Has tried to follow up with PCP but only able to do video visits.

## 2018-10-01 NOTE — ED Provider Notes (Signed)
[place]    CSN: [id number] Arrival date & time: 10/01/18  1444      History   Chief Complaint Chief Complaint  Patient presents with  . Mass    HPI Mary Kim is a 85 y.o. female.   HPI  Patient states she has had a lump under her neck for about a week.  It is painless.  It is growing.  She would like to have it checked.  Her appetite is good.  Energy level is normal.  No fever or infection.  She lives independently.  He is compliant with medical care  Past Medical History:  Diagnosis Date  . Elevated cholesterol   . Hearing loss   . Hyperlipidemia   . Hypertension   . Osteoporosis     Patient Active Problem List   Diagnosis Date Noted  . Dizziness 03/23/2018  . Allergic rhinitis 11/10/2017  . Eustachian tube dysfunction, unspecified laterality 11/10/2017  . Obstructive bronchiectasis ([place]) 07/08/2017  . Chronic cough 07/01/2017    Past Surgical History:  Procedure Laterality Date  . CATARACT EXTRACTION  2011    OB History    Gravida  0   Para  0   Term  0   Preterm  0   AB  0   Living        SAB  0   TAB  0   Ectopic  0   Multiple      Live Births               Home Medications    Prior to Admission medications   Medication Sig Start Date End Date Taking? Authorizing Provider  amLODipine (NORVASC) 10 MG tablet Take 10 mg by mouth daily.    [provider]  aspirin 81 MG tablet Take 81 mg by mouth daily.    [provider]  atorvastatin (LIPITOR) 20 MG tablet Take 20 mg by mouth daily.    [provider]  Calcium Carbonate-Vitamin D (CALTRATE 600+D) 600-400 MG-UNIT per tablet Take 1 tablet by mouth daily.    [provider]  cholecalciferol (VITAMIN D) 1000 UNITS tablet Take 1,000 Units by mouth daily.    [provider]  denosumab (PROLIA) 60 MG/ML SOLN injection Inject 60 mg into the skin every 6 (six) months. Given in May and Nov. - Administer in upper arm, thigh, or  abdomen    [provider]  fluticasone (FLONASE) 50 MCG/ACT nasal spray Place 2 sprays into both nostrils 2 (two) times daily. 11/10/17   Lauraine Rinne, NP  Multiple Vitamin (MULTIVITAMIN WITH MINERALS) TABS Take 1 tablet by mouth daily.    [provider]  Multiple Vitamins-Minerals (PRESERVISION AREDS 2) CAPS Take by mouth.    [provider]  omeprazole (PRILOSEC) 20 MG capsule Take 1 capsule (20 mg total) by mouth daily. 11/10/17   Lauraine Rinne, NP  OVER THE COUNTER MEDICATION Take 1 tablet by mouth daily. Called mucus relief, generic of mucinex dm    [provider]  Respiratory Therapy Supplies (FLUTTER) DEVI Use as directed 07/01/17   Tanda Rockers, MD  sodium chloride HYPERTONIC 3 % nebulizer solution Take two times daily prior to Flutter Valve use 03/23/18   Lauraine Rinne, NP    Family History No family history on file.  Social History Social History   Tobacco Use  . Smoking status: Never Smoker  . Smokeless tobacco: Never Used  Substance Use Topics  .  Alcohol use: No  . Drug use: No     Allergies   Patient has no known allergies.   Review of Systems Review of Systems  Constitutional: Negative for chills and fever.  HENT: Negative for ear pain and sore throat.   Eyes: Negative for pain and visual disturbance.  Respiratory: Negative for cough and shortness of breath.   Cardiovascular: Negative for chest pain and palpitations.  Gastrointestinal: Negative for abdominal pain and vomiting.  Genitourinary: Negative for dysuria and hematuria.  Musculoskeletal: Negative for arthralgias and back pain.  Skin: Negative for color change and rash.  Neurological: Negative for seizures and syncope.  All other systems reviewed and are negative.    Physical Exam Triage Vital Signs ED Triage Vitals  Enc Vitals Group     BP 10/01/18 1459 (!) 187/92     Pulse Rate 10/01/18 1459 94     Resp 10/01/18 1459 16     Temp 10/01/18 1459 97.9 F  (36.6 C)     Temp src --      SpO2 10/01/18 1459 96 %     Weight --      Height --      Head Circumference --      Peak Flow --      Pain Score 10/01/18 1501 0     Pain Loc --      Pain Edu? --      Excl. in [place]? --    No data found.  Updated Vital Signs BP (!) 187/92   Pulse 94   Temp 97.9 F (36.6 C)   Resp 16   SpO2 96%   Physical Exam Constitutional:      General: She is not in acute distress.    Appearance: She is well-developed.  HENT:     Head: Normocephalic and atraumatic.  Eyes:     Conjunctiva/sclera: Conjunctivae normal.     Pupils: Pupils are equal, round, and reactive to light.  Neck:     Musculoskeletal: Normal range of motion.   Cardiovascular:     Rate and Rhythm: Normal rate.  Pulmonary:     Effort: Pulmonary effort is normal. No respiratory distress.  Abdominal:     General: There is no distension.     Palpations: Abdomen is soft.  Musculoskeletal: Normal range of motion.  Skin:    General: Skin is warm and dry.  Neurological:     Mental Status: She is alert.      UC Treatments / Results  Labs (all labs ordered are listed, but only abnormal results are displayed) Labs Reviewed - No data to display  EKG   Radiology No results found.  Procedures Procedures (including critical care time)  Medications Ordered in UC Medications - No data to display  Initial Impression / Assessment and Plan / UC Course  I have reviewed the triage vital signs and the nursing notes.  Pertinent labs & imaging results that were available during my care of the patient were reviewed by me and considered in my medical decision making (see chart for details).     I called Dr. Sharen Counter in ENT.  He said he will see the patient if she is not better by next week.  In the meantime warm compresses, push fluids, sour candy.  If you develop fever or increasing pain she needs to come back Final Clinical Impressions(s) / UC Diagnoses   Final diagnoses:  Stone of  salivary duct     Discharge Instructions  Drink more water Warm compresses to area 20 min every couple of hours Suck on sour candy like lemon drops Call ENT if not better by Tuesday Call here or return if you develop pain or fever   ED Prescriptions    None     Controlled Substance Prescriptions [place] Controlled Substance Registry consulted? Not Applicable   Raylene Everts, MD 10/01/18 [phone]

## 2018-10-05 DIAGNOSIS — E46 Unspecified protein-calorie malnutrition: Secondary | ICD-10-CM | POA: Diagnosis not present

## 2018-10-05 DIAGNOSIS — K1120 Sialoadenitis, unspecified: Secondary | ICD-10-CM | POA: Diagnosis not present

## 2018-10-05 DIAGNOSIS — J479 Bronchiectasis, uncomplicated: Secondary | ICD-10-CM | POA: Diagnosis not present

## 2018-10-21 ENCOUNTER — Ambulatory Visit: Payer: Commercial Indemnity

## 2018-10-21 ENCOUNTER — Ambulatory Visit: Payer: Commercial Indemnity | Admitting: Internal Medicine

## 2018-10-22 ENCOUNTER — Ambulatory Visit: Payer: Commercial Indemnity

## 2018-10-22 ENCOUNTER — Ambulatory Visit (INDEPENDENT_AMBULATORY_CARE_PROVIDER_SITE_OTHER): Payer: Medicare Other | Admitting: Internal Medicine

## 2018-10-22 ENCOUNTER — Other Ambulatory Visit: Payer: Self-pay

## 2018-10-22 ENCOUNTER — Encounter: Payer: Self-pay | Admitting: Internal Medicine

## 2018-10-22 DIAGNOSIS — R05 Cough: Secondary | ICD-10-CM | POA: Diagnosis not present

## 2018-10-22 DIAGNOSIS — R053 Chronic cough: Secondary | ICD-10-CM

## 2018-10-22 DIAGNOSIS — J479 Bronchiectasis, uncomplicated: Secondary | ICD-10-CM

## 2018-10-22 NOTE — Progress Notes (Signed)
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 Consult    cough x 6 months no improvement, discolored mucous, 2 rounds of antibiotics. Chest CT 5/30  started with a head cold before xmas 2018  "just like everybody else" and some better after abx then worse p several weeks p finished abx and about the same since despite additional coursed abx though does not know names  Mucus was mostly clear occ yellow min volume and no change off  abx x several months  Worse at hs then resolves s noct or am distubance or flare  rec Augmentin 875 mg take one pill twice daily  X 10 days - Prednisone 10 mg take  4 each am x 2 days,   2 each am x 2 days,  1 each am x 2 days and stop  For mucinex dm 1200 mg every 12 hours and use the flutter valve as much as you can  Try prilosec otc 20mg   Take 30-60 min before first meal of the day and Pepcid ac (famotidine) 20 mg one @  bedtime until cough is completely gone for at least a week without the need for cough suppression GERD (REFLUX)   If not better when you finish your augmentin you will need a CT sinus > neg 07/20/17   07/21/17 NP Eval/ Mary Kim Spirometry c/w mild obst > symb 80 2bid / zyrtec hs  08/19/2017  f/u ov/Mary Kim re: obst bronchiectasis  Chief Complaint  Patient presents with  . Follow-up    Pt states she does not cough much. She only coughs after uses her flutter valve about 3 x per day- prod with clear sputum.    Dyspnea:  Not limited Cough: some hoarseness but cough is much better SABA use: none 02: none   rec No change > did not continue  symbicort or pepcid      12/02/2017  f/u ov/Mary Kim re: s/p bronchiectasis flare now off symbicort  And no change on vs off perceived  Chief Complaint  Patient presents with  . Follow-up    Cough is much improved. She produces clear sputum.   Dyspnea:  DR and back ok / [employer]  [place] leaning on cart  Cough: no am flare Using flutter  Prn  Sleeping: about 30 degrees with blocks / sleeps fine  SABA use: none 02: none rec If cough / congestion > mucinex max dose is 1200 mg every 12 hours and use the flutter valve as much as possible  GERD diet  Please schedule a follow up visit in 6  months but call sooner if needed     10/22/2018  f/u ov/Mary Kim re: bronchiectasis / symptoms improved since started on 3% saline bid Chief Complaint  Patient presents with  . Obstructive Bronchiectasis    Using nebulizer machine has seen improvement.  Dyspnea:  Walking up to 20 min daily  Cough: better / no purulent sputum Sleeping: wedge pillow/ bed is flat  SABA use: none 02: none Salivary stone >  f/u with Constance Holster    No obvious day to day or daytime variability or assoc excess/ purulent sputum or mucus plugs or hemoptysis or cp or chest tightness, subjective wheeze or overt sinus or hb symptoms.   Sleeping  without nocturnal  or early am exacerbation  of respiratory  c/o's or need for noct saba. Also denies any obvious fluctuation of symptoms with weather or environmental changes or other aggravating or alleviating factors except as outlined above   No unusual exposure hx or h/o childhood pna/ asthma or knowledge of premature birth.  Current Allergies, Complete Past Medical History, Past Surgical History, Family History, and Social History were reviewed in [employer] record.  ROS  The following are not active complaints unless bolded Hoarseness, sore throat, dysphagia, dental problems, itching, sneezing,  nasal congestion or discharge of excess mucus or purulent secretions,  ear ache,   fever, chills, sweats, unintended wt loss or wt gain, classically pleuritic or exertional cp,  orthopnea pnd or arm/hand swelling  or leg swelling, presyncope, palpitations, abdominal pain, anorexia, nausea, vomiting, diarrhea  or change in bowel habits or change in bladder habits, change in stools or change in urine, dysuria, hematuria,  rash, arthralgias, visual complaints, headache, numbness, weakness or ataxia or problems with walking or coordination,  change in mood or  memory.        Current Meds  Medication Sig  . amLODipine (NORVASC) 10 MG tablet Take 10 mg by mouth daily.  Marland Kitchen aspirin 81 MG tablet Take 81 mg by mouth daily.  Marland Kitchen atorvastatin (LIPITOR) 20 MG tablet Take 20 mg by mouth daily.  . Calcium Carbonate-Vitamin D (CALTRATE 600+D) 600-400 MG-UNIT per tablet Take 1 tablet by mouth daily.  . cholecalciferol (VITAMIN D) 1000 UNITS tablet Take 1,000 Units by mouth daily.  Marland Kitchen denosumab (PROLIA) 60 MG/ML SOLN injection Inject 60 mg into the skin every 6 (six) months. Given in May and Nov. - Administer in upper arm, thigh, or abdomen  . fluticasone (FLONASE) 50 MCG/ACT nasal spray Place 2 sprays into both nostrils 2 (two) times daily.  . Multiple Vitamin (MULTIVITAMIN WITH MINERALS) TABS Take 1 tablet by mouth daily.  . Multiple Vitamins-Minerals (PRESERVISION AREDS 2) CAPS Take by mouth.  Marland Kitchen omeprazole (PRILOSEC) 20 MG capsule Take 1 capsule (20 mg total) by mouth daily.  Marland Kitchen OVER THE COUNTER MEDICATION Take 1 tablet by mouth daily. Called mucus relief, generic of mucinex dm  . Respiratory Therapy Supplies (FLUTTER) DEVI Use as directed  . sodium chloride HYPERTONIC 3 % nebulizer solution Take two times daily prior to Flutter Valve use                         Objective:   Physical Exam   amb thin wf nad  - good cough mechanics, no gurgling   10/22/2018       106  12/02/2017       114  08/19/2017       113   07/01/17 113 lb 12.8 oz (51.6 kg)  03/17/17 118 lb 9.6 oz  (53.8 kg)  09/04/15 124 lb 3.2 oz (56.3 kg)     Vital signs reviewed - Note on arrival 02 sats  98% on RA      HEENT : pt wearing mask not removed for exam due to covid -19 concerns.    NECK :  without JVD/Nodes/TM/ nl carotid upstrokes bilaterally   LUNGS: no acc muscle use,  Nl  contour chest which is clear to A and P bilaterally without cough on insp or exp maneuvers   CV:  RRR  no s3 or murmur or increase in P2, and no edema   ABD:  soft and nontender with nl inspiratory excursion in the supine position. No bruits or organomegaly appreciated, bowel sounds nl  MS:  Slow gait/ ext warm without deformities, calf tenderness, cyanosis or clubbing No obvious joint restrictions   SKIN: warm and dry without lesions    NEURO:  alert, approp, nl sensorium with  no motor or cerebellar deficits apparent.        Assessment:

## 2018-10-22 NOTE — Patient Instructions (Signed)
No change in medications   Please schedule a follow up visit in 12  months but call sooner if needed  

## 2018-10-23 ENCOUNTER — Encounter: Payer: Self-pay | Admitting: Internal Medicine

## 2018-10-23 NOTE — Assessment & Plan Note (Signed)
Allergy profile 07/01/17 >  Eos 0.0 /  IgE  < 2 RAST  - 07/01/2017 rec augmentin x 10 days then sinus CT done 07/20/2017 >No significant paranasal sinus disease. 07/21/17 Feno   24  No worse off symbicort 80 > no change in rx needed

## 2018-10-23 NOTE — Assessment & Plan Note (Signed)
See CT chest 06/26/17 with lingular dz most prominent  - Started symbicort 80 2bid 07/21/17 > off ? When  - Spirometry 08/19/2017  FEV1 1.15 (72%)  Ratio 63  Classic curvature ? After symb?  - added hypertonic saline 03/23/18 and pt d/c'd symbicort   Much improved symptomatically with clear lungs on exam s maint rx   No changes needed > f/u q 12 mo, sooner prn

## 2018-10-29 DIAGNOSIS — E559 Vitamin D deficiency, unspecified: Secondary | ICD-10-CM | POA: Diagnosis not present

## 2018-10-29 DIAGNOSIS — E7849 Other hyperlipidemia: Secondary | ICD-10-CM | POA: Diagnosis not present

## 2018-10-29 DIAGNOSIS — M810 Age-related osteoporosis without current pathological fracture: Secondary | ICD-10-CM | POA: Diagnosis not present

## 2018-11-04 DIAGNOSIS — I10 Essential (primary) hypertension: Secondary | ICD-10-CM | POA: Diagnosis not present

## 2018-11-04 DIAGNOSIS — N1831 Chronic kidney disease, stage 3a: Secondary | ICD-10-CM | POA: Diagnosis not present

## 2018-11-04 DIAGNOSIS — K1120 Sialoadenitis, unspecified: Secondary | ICD-10-CM | POA: Diagnosis not present

## 2018-11-04 DIAGNOSIS — D692 Other nonthrombocytopenic purpura: Secondary | ICD-10-CM | POA: Diagnosis not present

## 2018-11-04 DIAGNOSIS — R05 Cough: Secondary | ICD-10-CM | POA: Diagnosis not present

## 2018-11-04 DIAGNOSIS — I872 Venous insufficiency (chronic) (peripheral): Secondary | ICD-10-CM | POA: Diagnosis not present

## 2018-11-04 DIAGNOSIS — E46 Unspecified protein-calorie malnutrition: Secondary | ICD-10-CM | POA: Diagnosis not present

## 2018-11-04 DIAGNOSIS — R82998 Other abnormal findings in urine: Secondary | ICD-10-CM | POA: Diagnosis not present

## 2018-11-04 DIAGNOSIS — Z0000 Encounter for general adult medical examination without abnormal findings: Secondary | ICD-10-CM | POA: Diagnosis not present

## 2018-11-04 DIAGNOSIS — I129 Hypertensive chronic kidney disease with stage 1 through stage 4 chronic kidney disease, or unspecified chronic kidney disease: Secondary | ICD-10-CM | POA: Diagnosis not present

## 2018-11-04 DIAGNOSIS — Z23 Encounter for immunization: Secondary | ICD-10-CM | POA: Diagnosis not present

## 2018-11-04 DIAGNOSIS — D72829 Elevated white blood cell count, unspecified: Secondary | ICD-10-CM | POA: Diagnosis not present

## 2018-11-04 DIAGNOSIS — R634 Abnormal weight loss: Secondary | ICD-10-CM | POA: Diagnosis not present

## 2018-11-04 DIAGNOSIS — J479 Bronchiectasis, uncomplicated: Secondary | ICD-10-CM | POA: Diagnosis not present

## 2018-11-04 DIAGNOSIS — Z1331 Encounter for screening for depression: Secondary | ICD-10-CM | POA: Diagnosis not present

## 2018-11-09 DIAGNOSIS — K118 Other diseases of salivary glands: Secondary | ICD-10-CM | POA: Diagnosis not present

## 2018-11-15 DIAGNOSIS — Z1212 Encounter for screening for malignant neoplasm of rectum: Secondary | ICD-10-CM | POA: Diagnosis not present

## 2018-11-22 ENCOUNTER — Ambulatory Visit (HOSPITAL_COMMUNITY)
Admission: RE | Admit: 2018-11-22 | Discharge: 2018-11-22 | Disposition: A | Discharge status: Home / Self Care | Payer: Medicare Other | Source: Ambulatory Visit | Attending: Internal Medicine | Admitting: Internal Medicine

## 2018-11-22 ENCOUNTER — Other Ambulatory Visit: Payer: Self-pay

## 2018-11-22 ENCOUNTER — Encounter (HOSPITAL_COMMUNITY): Payer: Self-pay

## 2018-11-22 DIAGNOSIS — M810 Age-related osteoporosis without current pathological fracture: Secondary | ICD-10-CM | POA: Diagnosis not present

## 2018-11-22 MED ORDER — DENOSUMAB 60 MG/ML ~~LOC~~ SOSY
60.0000 mg | PREFILLED_SYRINGE | Freq: Once | SUBCUTANEOUS | Status: AC
  Administered 2018-11-22: 60 mg via SUBCUTANEOUS
  Filled 2018-11-22: qty 1

## 2018-11-22 NOTE — Discharge Instructions (Signed)
Denosumab injection °What is this medicine? °DENOSUMAB (den oh sue mab) slows bone breakdown. Prolia is used to treat osteoporosis in women after menopause and in men, and in people who are taking corticosteroids for 6 months or more. Xgeva is used to treat a high calcium level due to cancer and to prevent bone fractures and other bone problems caused by multiple myeloma or cancer bone metastases. Xgeva is also used to treat giant cell tumor of the bone. °This medicine may be used for other purposes; ask your health care provider or pharmacist if you have questions. °COMMON BRAND NAME(S): Prolia, XGEVA °What should I tell my health care provider before I take this medicine? °They need to know if you have any of these conditions: °· dental disease °· having surgery or tooth extraction °· infection °· kidney disease °· low levels of calcium or Vitamin D in the blood °· malnutrition °· on hemodialysis °· skin conditions or sensitivity °· thyroid or parathyroid disease °· an unusual reaction to denosumab, other medicines, foods, dyes, or preservatives °· pregnant or trying to get pregnant °· breast-feeding °How should I use this medicine? °This medicine is for injection under the skin. It is given by a health care professional in a hospital or clinic setting. °A special MedGuide will be given to you before each treatment. Be sure to read this information carefully each time. °For Prolia, talk to your pediatrician regarding the use of this medicine in children. Special care may be needed. For Xgeva, talk to your pediatrician regarding the use of this medicine in children. While this drug may be prescribed for children as young as 13 years for selected conditions, precautions do apply. °Overdosage: If you think you have taken too much of this medicine contact a poison control center or emergency room at once. °NOTE: This medicine is only for you. Do not share this medicine with others. °What if I miss a dose? °It is  important not to miss your dose. Call your doctor or health care professional if you are unable to keep an appointment. °What may interact with this medicine? °Do not take this medicine with any of the following medications: °· other medicines containing denosumab °This medicine may also interact with the following medications: °· medicines that lower your chance of fighting infection °· steroid medicines like prednisone or cortisone °This list may not describe all possible interactions. Give your health care provider a list of all the medicines, herbs, non-prescription drugs, or dietary supplements you use. Also tell them if you smoke, drink alcohol, or use illegal drugs. Some items may interact with your medicine. °What should I watch for while using this medicine? °Visit your doctor or health care professional for regular checks on your progress. Your doctor or health care professional may order blood tests and other tests to see how you are doing. °Call your doctor or health care professional for advice if you get a fever, chills or sore throat, or other symptoms of a cold or flu. Do not treat yourself. This drug may decrease your body's ability to fight infection. Try to avoid being around people who are sick. °You should make sure you get enough calcium and vitamin D while you are taking this medicine, unless your doctor tells you not to. Discuss the foods you eat and the vitamins you take with your health care professional. °See your dentist regularly. Brush and floss your teeth as directed. Before you have any dental work done, tell your dentist you are   receiving this medicine. Do not become pregnant while taking this medicine or for 5 months after stopping it. Talk with your doctor or health care professional about your birth control options while taking this medicine. Women should inform their doctor if they wish to become pregnant or think they might be pregnant. There is a potential for serious side  effects to an unborn child. Talk to your health care professional or pharmacist for more information. What side effects may I notice from receiving this medicine? Side effects that you should report to your doctor or health care professional as soon as possible:  allergic reactions like skin rash, itching or hives, swelling of the face, lips, or tongue  bone pain  breathing problems  dizziness  jaw pain, especially after dental work  redness, blistering, peeling of the skin  signs and symptoms of infection like fever or chills; cough; sore throat; pain or trouble passing urine  signs of low calcium like fast heartbeat, muscle cramps or muscle pain; pain, tingling, numbness in the hands or feet; seizures  unusual bleeding or bruising  unusually weak or tired Side effects that usually do not require medical attention (report to your doctor or health care professional if they continue or are bothersome):  constipation  diarrhea  headache  joint pain  loss of appetite  muscle pain  runny nose  tiredness  upset stomach This list may not describe all possible side effects. Call your doctor for medical advice about side effects. You may report side effects to FDA at 1-800-FDA-1088. Where should I keep my medicine? This medicine is only given in a clinic, doctor's office, or other health care setting and will not be stored at home. NOTE: This sheet is a summary. It may not cover all possible information. If you have questions about this medicine, talk to your doctor, pharmacist, or health care provider.  2020 Elsevier/Gold Standard (2017-05-22 16:10:44)

## 2018-11-23 ENCOUNTER — Ambulatory Visit
Admission: RE | Admit: 2018-11-23 | Discharge: 2018-11-23 | Disposition: A | Discharge status: Home / Self Care | Payer: Medicare Other | Source: Ambulatory Visit | Attending: Internal Medicine | Admitting: Internal Medicine

## 2018-11-23 DIAGNOSIS — Z1231 Encounter for screening mammogram for malignant neoplasm of breast: Secondary | ICD-10-CM | POA: Diagnosis not present

## 2018-11-25 ENCOUNTER — Other Ambulatory Visit: Payer: Self-pay | Admitting: Internal Medicine

## 2018-11-25 DIAGNOSIS — R928 Other abnormal and inconclusive findings on diagnostic imaging of breast: Secondary | ICD-10-CM

## 2018-11-29 ENCOUNTER — Other Ambulatory Visit: Payer: Self-pay | Admitting: Otolaryngology

## 2018-11-29 DIAGNOSIS — K118 Other diseases of salivary glands: Secondary | ICD-10-CM

## 2018-11-30 ENCOUNTER — Other Ambulatory Visit: Payer: Self-pay

## 2018-11-30 ENCOUNTER — Ambulatory Visit
Admission: RE | Admit: 2018-11-30 | Discharge: 2018-11-30 | Disposition: A | Discharge status: Home / Self Care | Payer: Medicare Other | Source: Ambulatory Visit | Attending: Internal Medicine | Admitting: Internal Medicine

## 2018-11-30 DIAGNOSIS — R928 Other abnormal and inconclusive findings on diagnostic imaging of breast: Secondary | ICD-10-CM

## 2018-11-30 DIAGNOSIS — N6311 Unspecified lump in the right breast, upper outer quadrant: Secondary | ICD-10-CM | POA: Diagnosis not present

## 2018-11-30 DIAGNOSIS — N6313 Unspecified lump in the right breast, lower outer quadrant: Secondary | ICD-10-CM | POA: Diagnosis not present

## 2018-12-07 DIAGNOSIS — H04123 Dry eye syndrome of bilateral lacrimal glands: Secondary | ICD-10-CM | POA: Diagnosis not present

## 2018-12-07 DIAGNOSIS — H353133 Nonexudative age-related macular degeneration, bilateral, advanced atrophic without subfoveal involvement: Secondary | ICD-10-CM | POA: Diagnosis not present

## 2018-12-07 DIAGNOSIS — H401123 Primary open-angle glaucoma, left eye, severe stage: Secondary | ICD-10-CM | POA: Diagnosis not present

## 2018-12-07 DIAGNOSIS — H52203 Unspecified astigmatism, bilateral: Secondary | ICD-10-CM | POA: Diagnosis not present

## 2018-12-08 ENCOUNTER — Ambulatory Visit
Admission: RE | Admit: 2018-12-08 | Discharge: 2018-12-08 | Disposition: A | Discharge status: Home / Self Care | Payer: Medicare Other | Source: Ambulatory Visit | Attending: Otolaryngology | Admitting: Otolaryngology

## 2018-12-08 ENCOUNTER — Other Ambulatory Visit: Payer: Self-pay

## 2018-12-08 DIAGNOSIS — R221 Localized swelling, mass and lump, neck: Secondary | ICD-10-CM | POA: Diagnosis not present

## 2018-12-08 DIAGNOSIS — K118 Other diseases of salivary glands: Secondary | ICD-10-CM

## 2018-12-08 MED ORDER — IOPAMIDOL (ISOVUE-300) INJECTION 61%
75.0000 mL | Freq: Once | INTRAVENOUS | Status: AC | PRN
  Administered 2018-12-08: 75 mL via INTRAVENOUS

## 2019-01-05 ENCOUNTER — Other Ambulatory Visit: Payer: Self-pay

## 2019-01-05 ENCOUNTER — Encounter (HOSPITAL_BASED_OUTPATIENT_CLINIC_OR_DEPARTMENT_OTHER): Payer: Self-pay

## 2019-01-06 ENCOUNTER — Other Ambulatory Visit (HOSPITAL_COMMUNITY)
Admission: RE | Admit: 2019-01-06 | Discharge: 2019-01-06 | Disposition: A | Discharge status: Home / Self Care | Payer: Medicare Other | Source: Ambulatory Visit | Attending: Otolaryngology | Admitting: Otolaryngology

## 2019-01-06 ENCOUNTER — Encounter (HOSPITAL_BASED_OUTPATIENT_CLINIC_OR_DEPARTMENT_OTHER)
Admission: RE | Admit: 2019-01-06 | Discharge: 2019-01-06 | Disposition: A | Discharge status: Home / Self Care | Payer: Medicare Other | Source: Ambulatory Visit | Attending: Otolaryngology | Admitting: Otolaryngology

## 2019-01-06 DIAGNOSIS — C8591 Non-Hodgkin lymphoma, unspecified, lymph nodes of head, face, and neck: Secondary | ICD-10-CM | POA: Diagnosis not present

## 2019-01-06 DIAGNOSIS — C9110 Chronic lymphocytic leukemia of B-cell type not having achieved remission: Secondary | ICD-10-CM | POA: Diagnosis not present

## 2019-01-06 DIAGNOSIS — Z01812 Encounter for preprocedural laboratory examination: Secondary | ICD-10-CM | POA: Diagnosis present

## 2019-01-06 DIAGNOSIS — K219 Gastro-esophageal reflux disease without esophagitis: Secondary | ICD-10-CM | POA: Diagnosis not present

## 2019-01-06 DIAGNOSIS — R221 Localized swelling, mass and lump, neck: Secondary | ICD-10-CM | POA: Diagnosis present

## 2019-01-06 DIAGNOSIS — Z20828 Contact with and (suspected) exposure to other viral communicable diseases: Secondary | ICD-10-CM | POA: Diagnosis not present

## 2019-01-06 DIAGNOSIS — I10 Essential (primary) hypertension: Secondary | ICD-10-CM | POA: Diagnosis not present

## 2019-01-07 LAB — NOVEL CORONAVIRUS, NAA (HOSP ORDER, SEND-OUT TO REF LAB; TAT 18-24 HRS): SARS-CoV-2, NAA: NOT DETECTED

## 2019-01-10 ENCOUNTER — Encounter (HOSPITAL_BASED_OUTPATIENT_CLINIC_OR_DEPARTMENT_OTHER): Payer: Self-pay | Admitting: Otolaryngology

## 2019-01-10 ENCOUNTER — Ambulatory Visit (HOSPITAL_BASED_OUTPATIENT_CLINIC_OR_DEPARTMENT_OTHER): Payer: Medicare Other | Admitting: Anesthesiology

## 2019-01-10 ENCOUNTER — Other Ambulatory Visit: Payer: Self-pay

## 2019-01-10 ENCOUNTER — Ambulatory Visit (HOSPITAL_BASED_OUTPATIENT_CLINIC_OR_DEPARTMENT_OTHER)
Admission: RE | Admit: 2019-01-10 | Discharge: 2019-01-10 | Disposition: A | Discharge status: Home / Self Care | Payer: Medicare Other | Attending: Otolaryngology | Admitting: Otolaryngology

## 2019-01-10 ENCOUNTER — Encounter (HOSPITAL_BASED_OUTPATIENT_CLINIC_OR_DEPARTMENT_OTHER)
Admission: RE | Disposition: A | Discharge status: Home / Self Care | Payer: Self-pay | Source: Home / Self Care | Attending: Otolaryngology

## 2019-01-10 DIAGNOSIS — K219 Gastro-esophageal reflux disease without esophagitis: Secondary | ICD-10-CM | POA: Insufficient documentation

## 2019-01-10 DIAGNOSIS — C9110 Chronic lymphocytic leukemia of B-cell type not having achieved remission: Secondary | ICD-10-CM | POA: Insufficient documentation

## 2019-01-10 DIAGNOSIS — C8591 Non-Hodgkin lymphoma, unspecified, lymph nodes of head, face, and neck: Secondary | ICD-10-CM | POA: Diagnosis not present

## 2019-01-10 DIAGNOSIS — I10 Essential (primary) hypertension: Secondary | ICD-10-CM | POA: Insufficient documentation

## 2019-01-10 HISTORY — DX: Chronic cough: R05.3

## 2019-01-10 HISTORY — PX: LYMPH NODE BIOPSY: SHX201

## 2019-01-10 HISTORY — DX: Gastro-esophageal reflux disease without esophagitis: K21.9

## 2019-01-10 SURGERY — LYMPH NODE BIOPSY
Anesthesia: General | Site: Neck | Laterality: Left

## 2019-01-10 MED ORDER — EPHEDRINE SULFATE 50 MG/ML IJ SOLN
INTRAMUSCULAR | Status: DC | PRN
  Administered 2019-01-10 (×3): 10 mg via INTRAVENOUS

## 2019-01-10 MED ORDER — ACETAMINOPHEN 325 MG PO TABS
650.0000 mg | ORAL_TABLET | Freq: Once | ORAL | Status: AC
  Administered 2019-01-10: 650 mg via ORAL

## 2019-01-10 MED ORDER — DEXAMETHASONE SODIUM PHOSPHATE 4 MG/ML IJ SOLN
INTRAMUSCULAR | Status: DC | PRN
  Administered 2019-01-10: 4 mg via INTRAVENOUS

## 2019-01-10 MED ORDER — FENTANYL CITRATE (PF) 100 MCG/2ML IJ SOLN: 25.0000 ug | INTRAMUSCULAR | Status: DC | PRN

## 2019-01-10 MED ORDER — ONDANSETRON HCL 4 MG/2ML IJ SOLN
INTRAMUSCULAR | Status: DC | PRN
  Administered 2019-01-10: 4 mg via INTRAVENOUS

## 2019-01-10 MED ORDER — FENTANYL CITRATE (PF) 100 MCG/2ML IJ SOLN
INTRAMUSCULAR | Status: AC
  Filled 2019-01-10: qty 2

## 2019-01-10 MED ORDER — FENTANYL CITRATE (PF) 100 MCG/2ML IJ SOLN
INTRAMUSCULAR | Status: DC | PRN
  Administered 2019-01-10: 25 ug via INTRAVENOUS

## 2019-01-10 MED ORDER — LACTATED RINGERS IV SOLN
INTRAVENOUS | Status: DC
  Administered 2019-01-10: 09:00:00 via INTRAVENOUS

## 2019-01-10 MED ORDER — LIDOCAINE-EPINEPHRINE 1 %-1:100000 IJ SOLN
INTRAMUSCULAR | Status: DC | PRN
  Administered 2019-01-10: 1.6 mL

## 2019-01-10 MED ORDER — ONDANSETRON HCL 4 MG/2ML IJ SOLN: 4.0000 mg | Freq: Once | INTRAMUSCULAR | Status: DC | PRN

## 2019-01-10 MED ORDER — LIDOCAINE HCL (CARDIAC) PF 100 MG/5ML IV SOSY
PREFILLED_SYRINGE | INTRAVENOUS | Status: DC | PRN
  Administered 2019-01-10: 50 mg via INTRAVENOUS

## 2019-01-10 MED ORDER — ACETAMINOPHEN 325 MG PO TABS
ORAL_TABLET | ORAL | Status: AC
  Filled 2019-01-10: qty 2

## 2019-01-10 MED ORDER — PROPOFOL 10 MG/ML IV BOLUS
INTRAVENOUS | Status: DC | PRN
  Administered 2019-01-10: 50 mg via INTRAVENOUS
  Administered 2019-01-10: 20 mg via INTRAVENOUS

## 2019-01-10 SURGICAL SUPPLY — 54 items
ATTRACTOMAT 16X20 MAGNETIC DRP (DRAPES) IMPLANT
BAND RUBBER #18 3X1/16 STRL (MISCELLANEOUS) IMPLANT
BENZOIN TINCTURE PRP APPL 2/3 (GAUZE/BANDAGES/DRESSINGS) IMPLANT
BLADE SURG 15 STRL LF DISP TIS (BLADE) ×1 IMPLANT
BLADE SURG 15 STRL SS (BLADE) ×2
CANISTER SUCT 1200ML W/VALVE (MISCELLANEOUS) ×3 IMPLANT
CLEANER CAUTERY TIP 5X5 PAD (MISCELLANEOUS) ×1 IMPLANT
CLIP VESOCCLUDE MED 6/CT (CLIP) IMPLANT
CLIP VESOCCLUDE SM WIDE 6/CT (CLIP) IMPLANT
CLOSURE WOUND 1/2 X4 (GAUZE/BANDAGES/DRESSINGS)
CORD BIPOLAR FORCEPS 12FT (ELECTRODE) IMPLANT
COVER BACK TABLE REUSABLE LG (DRAPES) ×3 IMPLANT
COVER MAYO STAND REUSABLE (DRAPES) ×3 IMPLANT
COVER WAND RF STERILE (DRAPES) IMPLANT
DERMABOND ADVANCED (GAUZE/BANDAGES/DRESSINGS) ×2
DERMABOND ADVANCED .7 DNX12 (GAUZE/BANDAGES/DRESSINGS) ×1 IMPLANT
DRAIN JACKSON RD 7FR 3/32 (WOUND CARE) IMPLANT
DRAIN PENROSE 1/4X12 LTX STRL (WOUND CARE) IMPLANT
DRAPE U-SHAPE 76X120 STRL (DRAPES) ×3 IMPLANT
ELECT COATED BLADE 2.86 ST (ELECTRODE) ×3 IMPLANT
ELECT REM PT RETURN 9FT ADLT (ELECTROSURGICAL) ×3
ELECTRODE REM PT RTRN 9FT ADLT (ELECTROSURGICAL) ×1 IMPLANT
EVACUATOR SILICONE 100CC (DRAIN) IMPLANT
GAUZE 4X4 16PLY RFD (DISPOSABLE) IMPLANT
GAUZE SPONGE 4X4 12PLY STRL LF (GAUZE/BANDAGES/DRESSINGS) IMPLANT
GLOVE ECLIPSE 7.5 STRL STRAW (GLOVE) ×3 IMPLANT
GOWN STRL REUS W/ TWL LRG LVL3 (GOWN DISPOSABLE) ×1 IMPLANT
GOWN STRL REUS W/ TWL XL LVL3 (GOWN DISPOSABLE) ×1 IMPLANT
GOWN STRL REUS W/TWL LRG LVL3 (GOWN DISPOSABLE) ×2
GOWN STRL REUS W/TWL XL LVL3 (GOWN DISPOSABLE) ×2
NEEDLE PRECISIONGLIDE 27X1.5 (NEEDLE) IMPLANT
NS IRRIG 1000ML POUR BTL (IV SOLUTION) ×3 IMPLANT
PACK BASIN DAY SURGERY FS (CUSTOM PROCEDURE TRAY) ×3 IMPLANT
PAD CLEANER CAUTERY TIP 5X5 (MISCELLANEOUS) ×2
PENCIL FOOT CONTROL (ELECTRODE) ×3 IMPLANT
SPONGE GAUZE 2X2 8PLY STER LF (GAUZE/BANDAGES/DRESSINGS) ×1
SPONGE GAUZE 2X2 8PLY STRL LF (GAUZE/BANDAGES/DRESSINGS) ×2 IMPLANT
STRIP CLOSURE SKIN 1/2X4 (GAUZE/BANDAGES/DRESSINGS) IMPLANT
SUCTION FRAZIER HANDLE 10FR (MISCELLANEOUS)
SUCTION TUBE FRAZIER 10FR DISP (MISCELLANEOUS) IMPLANT
SUT CHROMIC 3 0 PS 2 (SUTURE) ×3 IMPLANT
SUT CHROMIC 4 0 P 3 18 (SUTURE) IMPLANT
SUT ETHILON 4 0 PS 2 18 (SUTURE) IMPLANT
SUT ETHILON 5 0 P 3 18 (SUTURE)
SUT NYLON ETHILON 5-0 P-3 1X18 (SUTURE) IMPLANT
SUT PLAIN 5 0 P 3 18 (SUTURE) IMPLANT
SUT SILK 4 0 TIES 17X18 (SUTURE) IMPLANT
SUT VICRYL 4-0 PS2 18IN ABS (SUTURE) IMPLANT
SYR BULB 3OZ (MISCELLANEOUS) ×3 IMPLANT
SYR CONTROL 10ML LL (SYRINGE) ×3 IMPLANT
TOWEL GREEN STERILE FF (TOWEL DISPOSABLE) ×3 IMPLANT
TRAY DSU PREP LF (CUSTOM PROCEDURE TRAY) ×3 IMPLANT
TUBE CONNECTING 20'X1/4 (TUBING) ×1
TUBE CONNECTING 20X1/4 (TUBING) ×2 IMPLANT

## 2019-01-10 NOTE — Anesthesia Postprocedure Evaluation (Signed)
Anesthesia Post Note  Patient: Marinda Elk  Procedure(s) Performed: EXCISIONAL BIOPSY OF CERVICAL LYMPH NODE/LEFT (Left Neck)     Patient location during evaluation: PACU Anesthesia Type: General Level of consciousness: awake and alert Pain management: pain level controlled Vital Signs Assessment: post-procedure vital signs reviewed and stable Respiratory status: spontaneous breathing, nonlabored ventilation and respiratory function stable Cardiovascular status: blood pressure returned to baseline and stable Postop Assessment: no apparent nausea or vomiting Anesthetic complications: no    Last Vitals:  Vitals:   01/10/19 1115 01/10/19 1130  BP: 136/67 137/71  Pulse: 93 89  Resp: (!) 21 20  Temp:  36.5 C  SpO2: 97% 99%    Last Pain:  Vitals:   01/10/19 1115  TempSrc:   PainSc: 0-No pain                 Catalina Gravel

## 2019-01-10 NOTE — Anesthesia Procedure Notes (Signed)
Procedure Name: LMA Insertion Date/Time: 01/10/2019 10:00 AM Performed by: Maryella Shivers, CRNA Pre-anesthesia Checklist: Patient identified, Emergency Drugs available, Suction available and Patient being monitored Patient Re-evaluated:Patient Re-evaluated prior to induction Oxygen Delivery Method: Circle system utilized Preoxygenation: Pre-oxygenation with 100% oxygen Induction Type: IV induction Ventilation: Mask ventilation without difficulty LMA: LMA inserted LMA Size: 4.0 Number of attempts: 1 Airway Equipment and Method: Bite block Placement Confirmation: positive ETCO2 Tube secured with: Tape Dental Injury: Teeth and Oropharynx as per pre-operative assessment

## 2019-01-10 NOTE — Op Note (Signed)
OPERATIVE REPORT  DATE OF SURGERY: 01/10/2019  PATIENT:  Mary Kim,  85 y.o. female  PRE-OPERATIVE DIAGNOSIS:  NECK MASS  POST-OPERATIVE DIAGNOSIS:  NECK MASS  PROCEDURE:  Procedure(s): EXCISIONAL BIOPSY OF CERVICAL LYMPH NODE/LEFT  SURGEON:  Beckie Salts, MD  ASSISTANTS: None  ANESTHESIA:   General   EBL: Less than 10 ml  DRAINS: None  LOCAL MEDICATIONS USED: 1% Xylocaine with epinephrine  SPECIMEN: To level 2 lymph nodes from the left neck, sent fresh for pathologic evaluation for lymphoma  COUNTS:  Correct  PROCEDURE DETAILS: The patient was taken to the operating room and placed on the operating table in the supine position. Following induction of general endotracheal, laryngeal mask airway anesthesia, the left neck was prepped and draped in standard fashion.  Level 2 lymph node was identified and a proposed incision was outlined with a marking pen, curvilinear just anterior to the sternocleidomastoid muscle following a natural skin crease.  Local anesthetic was infiltrated.  A #15 scalpel was used to incise the skin and subcutaneous tissue.  Blunt dissection deep to the sternocleidomastoid muscle was accomplished and the lymph nodes were identified and removed.  Electrocautery was used for hemostasis.  The internal jugular vein was identified and preserved.  No named nerves were identified.  To lymph nodes each approximately 2 cm in greatest dimension were removed and sent fresh for pathologic evaluation.  Hemostasis was completed.  The wound was irrigated with saline.  Closure was accomplished using interrupted 3-0 chromic on the platysmal layer and a running subcuticular layer closure.  Dermabond was used on the skin.  Patient was awakened extubated and transferred to recovery in stable condition.    PATIENT DISPOSITION:  To PACU, stable

## 2019-01-10 NOTE — Transfer of Care (Signed)
Immediate Anesthesia Transfer of Care Note  Patient: Mary Kim  Procedure(s) Performed: EXCISIONAL BIOPSY OF CERVICAL LYMPH NODE/LEFT (Left Neck)  Patient Location: PACU  Anesthesia Type:General  Level of Consciousness: sedated  Airway & Oxygen Therapy: Patient Spontanous Breathing and Patient connected to face mask oxygen  Post-op Assessment: Report given to RN and Post -op Vital signs reviewed and stable  Post vital signs: Reviewed and stable  Last Vitals:  Vitals Value Taken Time  BP 135/64 01/10/19 1043  Temp    Pulse 95 01/10/19 1045  Resp 23 01/10/19 1045  SpO2 100 % 01/10/19 1045  Vitals shown include unvalidated device data.  Last Pain:  Vitals:   01/10/19 0912  TempSrc: Temporal  PainSc: 0-No pain         Complications: No apparent anesthesia complications

## 2019-01-10 NOTE — H&P (Signed)
HPI:   Mary Kim is a 85 y.o. female who presents as a new Patient.   Referring Provider: Self, A Referral  Chief complaint: Neck mass.  HPI: Very healthy 85 year old with a approximately 1 month history of a left submandibular mass. It came up rather suddenly. It does not cause any discomfort. It does not fluctuate with meals. She was treated with medication for about a couple of weeks and it got smaller initially but now seems to be back to the same size. No history of skin cancer or any other oral or head neck pathology. No x-rays as of yet.  PMH/Meds/All/SocHx/FamHx/ROS:   Past Medical History:  Diagnosis Date  . Hypertension   Past Surgical History:  Procedure Laterality Date  . NO PAST SURGERIES   No family history of bleeding disorders, wound healing problems or difficulty with anesthesia.   Social History   Socioeconomic History  . Marital status: Unknown  Spouse name: Not on file  . Number of children: Not on file  . Years of education: Not on file  . Highest education level: Not on file  Occupational History  . Not on file  Social Needs  . Financial resource strain: Not on file  . Food insecurity  Worry: Not on file  Inability: Not on file  . Transportation needs  Medical: Not on file  Non-medical: Not on file  Tobacco Use  . Smoking status: Never Smoker  . Smokeless tobacco: Never Used  Substance and Sexual Activity  . Alcohol use: Not Currently  . Drug use: Never  . Sexual activity: Not on file  Lifestyle  . Physical activity  Days per week: Not on file  Minutes per session: Not on file  . Stress: Not on file  Relationships  . Social Medical illustrator on phone: Not on file  Gets together: Not on file  Attends religious service: Not on file  Active member of club or organization: Not on file  Attends meetings of clubs or organizations: Not on file  Relationship status: Not on file  Other Topics Concern  . Not on file  Social History  Narrative  . Not on file   Current Outpatient Medications:  . amLODIPine (NORVASC) 10 MG tablet, TK 1 T PO QD, Disp: , Rfl:  . aspirin-calcium carbonate 81 mg-300 mg calcium(777 mg) Tab tablet *ANTIPLATELET*, Take 81 mg by mouth., Disp: , Rfl:  . atorvastatin (LIPITOR) 20 MG tablet, TK 1 T PO QD, Disp: , Rfl:  . calcium carbonate-vit D3-min 600 mg calcium- 400 unit Tab, Take by mouth., Disp: , Rfl:  . denosumab (PROLIA) 60 mg/mL Syrg, Inject 60 mg into the skin., Disp: , Rfl:  . latanoprost (XALATAN) 0.005 % ophthalmic solution, INT 1 GTT INTO OU QHS, Disp: , Rfl:  . sodium chloride 3 % nebulizer solution, USE BID PRIOR TO FLUTTER VALVE USE, Disp: , Rfl:   A complete ROS was performed with pertinent positives/negatives noted in the HPI. The remainder of the ROS are negative.   Physical Exam:   BP 178/89  Pulse 100  Temp 97.4 F (36.3 C)  Ht 1.651 m (5\' 5" )  Wt 48.1 kg (106 lb)  BMI 17.64 kg/m   General: Healthy and alert, in no distress, breathing easily. Normal affect. In a pleasant mood. Head: Normocephalic, atraumatic. No masses, or scars. Eyes: Pupils are equal, and reactive to light. Vision is grossly intact. No spontaneous or gaze nystagmus. Ears: Ear canals are clear. Tympanic membranes are intact, with  normal landmarks and the middle ears are clear and healthy. Hearing: Grossly normal. Nose: Nasal cavities are clear with healthy mucosa, no polyps or exudate. Airways are patent. Face: No masses or scars, facial nerve function is symmetric. Oral Cavity: No mucosal abnormalities are noted. Tongue with normal mobility. Dentition appears healthy. Floor of mouth is free of any visible or palpable pathology. I am able to express saliva from the right submandibular gland but not the left. Oropharynx: There are no mucosal masses identified. Tongue base appears normal and healthy. Larynx/Hypopharynx: deferred Chest: Deferred Neck: The left submandibular gland is enlarged but soft  and nontender with an associated facial node just above it. The gland itself is about 4 cm and the node is about 1-1/2 cm, no thyroid nodules or enlargement. Neuro: Cranial nerves II-XII with normal function. Balance: Normal gate. Other findings: none.  Independent Review of Additional Tests or Records:  none  Procedures:  none  Impression & Plans:  Left submandibular mass. By history this does not appear to be an obstructive pathology such as a stone. This may represent a neoplasm. It is not firm, tender or fixed to the underlying structures. We had a long discussion with the patient and her daughter about possible work-up and treatment. The next step would normally be a CT scan to rule out neoplasm and calcular disease, followed by FNA as dictated by the CT scan. Depending on what that showed surgical resection may be necessary. Given her age, she is not really interested in pursuing any of this right now. I think that is reasonable. I would recommend follow-up if she changes her mind and wants to at least proceed with CT imaging and possibly FNA. I would also recommend they follow-up if she develops any pain, weakness of the lip, or any other new symptoms.

## 2019-01-10 NOTE — Discharge Instructions (Signed)
You may shower and use soap and water. Do not use any creams, oils or ointment.  Dr. Constance Holster will call with results later in the week.  You will not need to return to the office unless there is a problem.  Okay to use Tylenol/Motrin as needed for pain    Post Anesthesia Home Care Instructions  Activity: Get plenty of rest for the remainder of the day. A responsible individual must stay with you for 24 hours following the procedure.  For the next 24 hours, DO NOT: -Drive a car -Paediatric nurse -Drink alcoholic beverages -Take any medication unless instructed by your physician -Make any legal decisions or sign important papers.  Meals: Start with liquid foods such as gelatin or soup. Progress to regular foods as tolerated. Avoid greasy, spicy, heavy foods. If nausea and/or vomiting occur, drink only clear liquids until the nausea and/or vomiting subsides. Call your physician if vomiting continues.  Special Instructions/Symptoms: Your throat may feel dry or sore from the anesthesia or the breathing tube placed in your throat during surgery. If this causes discomfort, gargle with warm salt water. The discomfort should disappear within 24 hours.  If you had a scopolamine patch placed behind your ear for the management of post- operative nausea and/or vomiting:  1. The medication in the patch is effective for 72 hours, after which it should be removed.  Wrap patch in a tissue and discard in the trash. Wash hands thoroughly with soap and water. 2. You may remove the patch earlier than 72 hours if you experience unpleasant side effects which may include dry mouth, dizziness or visual disturbances. 3. Avoid touching the patch. Wash your hands with soap and water after contact with the patch.

## 2019-01-10 NOTE — Anesthesia Preprocedure Evaluation (Signed)
Anesthesia Evaluation  Patient identified by MRN, date of birth, ID band Patient awake    Reviewed: Allergy & Precautions, NPO status , Patient's Chart, lab work & pertinent test results  History of Anesthesia Complications Negative for: history of anesthetic complications  Airway Mallampati: I  TM Distance: >3 FB Neck ROM: Full    Dental  (+) Teeth Intact, Dental Advisory Given, Partial Upper, Partial Lower   Pulmonary neg pulmonary ROS,    Pulmonary exam normal breath sounds clear to auscultation       Cardiovascular hypertension, Pt. on medications (-) angina(-) CAD and (-) Past MI Normal cardiovascular exam Rhythm:Regular Rate:Normal     Neuro/Psych negative neurological ROS  negative psych ROS   GI/Hepatic Neg liver ROS, GERD  Medicated and Controlled,  Endo/Other  negative endocrine ROS  Renal/GU negative Renal ROS     Musculoskeletal negative musculoskeletal ROS (+)   Abdominal   Peds  Hematology negative hematology ROS (+)   Anesthesia Other Findings Day of surgery medications reviewed with the patient.  NECK MASS  Reproductive/Obstetrics                             Anesthesia Physical Anesthesia Plan  ASA: III  Anesthesia Plan: General   Post-op Pain Management:    Induction: Intravenous  PONV Risk Score and Plan: 3 and Dexamethasone and Ondansetron  Airway Management Planned: LMA  Additional Equipment:   Intra-op Plan:   Post-operative Plan: Extubation in OR  Informed Consent: I have reviewed the patients History and Physical, chart, labs and discussed the procedure including the risks, benefits and alternatives for the proposed anesthesia with the patient or authorized representative who has indicated his/her understanding and acceptance.     Dental advisory given  Plan Discussed with: CRNA  Anesthesia Plan Comments:         Anesthesia Quick  Evaluation

## 2019-01-11 ENCOUNTER — Encounter: Payer: Self-pay | Admitting: *Deleted

## 2019-01-12 LAB — SURGICAL PATHOLOGY

## 2019-01-25 ENCOUNTER — Telehealth: Payer: Self-pay | Admitting: Radiation Oncology

## 2019-01-25 NOTE — Telephone Encounter (Signed)
New message:   Called patient but unable to leave msg/no vcmail set up.

## 2019-01-31 ENCOUNTER — Telehealth: Payer: Self-pay | Admitting: *Deleted

## 2019-01-31 NOTE — Telephone Encounter (Signed)
Oncology Nurse Navigator Documentation  Placed introductory call to new referral patient Mary Kim.  Introduced myself as the H&N oncology nurse navigator that works with Dr. Isidore Moos to whom she has been referred by Dr. Constance Holster.  She confirmed understanding of referral.  Briefly explained my role as her navigator, provided my contact information.   Confirmed her understanding of tomorrow's teleconsult with Dr. Isidore Moos.  She indicated her dtr [name] will be joining her.   I explained the purpose of a dental evaluation prior to starting RT, indicated she might be referred to [facility] pending tomorrow's discussion.    I encouraged her to call with questions/concerns as she moves forward with appts and procedures.    Navigator Initial Assessment . Employment Status:  Still doing [place] work in [employer]. . Currently on FMLA / STD: No . Living Situation: Lives alone, [facility] [place] . Support System: Dtr who lives in [place], [place] staff. Marland Kitchen PCP: Dr. Virgina Jock . PCD: Dr. Kellie Shropshire . Financial Concerns: No . Transportation Needs: no, dtr can provide . Sensory Deficits: no . Language Barriers/Interpreter Needed:  no . Ambulation Needs: no . DME Used in Home: no . Psychosocial Needs:  no . Concerns/Needs Understanding Cancer:  addressed/answered by navigator to best of ability . Self-Expressed Needs: no

## 2019-01-31 NOTE — Progress Notes (Signed)
error 

## 2019-01-31 NOTE — Progress Notes (Signed)
Radiation Oncology         [phone] ________________________________  Initial outpatient telephone Consultation by telephone as patient was unable to access MyChart video during pandemic precautions   Name: Mary Kim MRN: [id number]  Date: 02/01/2019  DOB: 24-Sep-1923  [id number], [name], MD  Izora Gala, MD   REFERRING PHYSICIAN: Izora Gala, MD  DIAGNOSIS:    ICD-10-CM   1. 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 CLL (chronic lymphocytic leukemia) ([facility])  C91.10     CHIEF COMPLAINT: Here to discuss management of lymphoma  HISTORY OF PRESENT ILLNESS::Mary Kim is a 86 y.o. female who presented with a lump under her neck. It was a "knot under the left jaw like a little ball."  It was initially thought by her doctor to be a salivary stone.    Subsequently, the patient saw Dr. Constance Holster who recommended neck CT and aspiration or surgical resection. She underwent neck CT on 12/08/2018, which showed: cervical lymphadenopathy at both the upper and lower nodal stations bilaterally; an index left level 2 lymph node measuring 2.4 cm likely accounts for palpable submandibular abnormality.  Excisional biopsy of left cervical lymph node on 01/10/2019 revealed: chronic lymphocytic leukemia/small lymphocytic lymphoma.  No pain.  Swallowing issues, if any: none  Weight Changes: none Wt Readings from Last 3 Encounters:  01/10/19 106 lb 7.7 oz (48.3 kg)  10/22/18 106 lb 6.4 oz (48.3 kg)  03/23/18 108 lb 9.6 oz (49.3 kg)      Tobacco history, if any: none  ETOH abuse, if any: none  Prior cancers, if any: none  No fevers or night sweats.  Right now she appreciates 2 lumps in the left neck, 1 on the right. They are not bothersome to her.  She is just aware of them. She doesn't feel like they have grown much since October.  No other lumps of bumps in body.  No fatigue. No SOB.  No bone pain.   She is in great shape per her daughter. She is  independent, drives car to grocery store. Daughter present for call.  PREVIOUS RADIATION THERAPY: No  PAST MEDICAL HISTORY:  has a past medical history of Chronic cough, Elevated cholesterol, GERD (gastroesophageal reflux disease), Hearing loss, Hyperlipidemia, Hypertension, and Osteoporosis.    PAST SURGICAL HISTORY: Past Surgical History:  Procedure Laterality Date  . CATARACT EXTRACTION  2011  . LYMPH NODE BIOPSY Left 01/10/2019   Procedure: EXCISIONAL BIOPSY OF CERVICAL LYMPH NODE/LEFT;  Surgeon: Izora Gala, MD;  Location: [facility];  Service: ENT;  Laterality: Left;    FAMILY HISTORY: family history is not on file.  SOCIAL HISTORY:  reports that she has never smoked. She has never used smokeless tobacco. She reports that she does not drink alcohol or use drugs.  ALLERGIES: Patient has no known allergies.  MEDICATIONS:  Current Outpatient Medications  Medication Sig Dispense Refill  . amLODipine (NORVASC) 10 MG tablet Take 10 mg by mouth daily.    Marland Kitchen aspirin 81 MG tablet Take 81 mg by mouth daily.    Marland Kitchen atorvastatin (LIPITOR) 20 MG tablet Take 20 mg by mouth daily.    . Calcium Carbonate-Vitamin D (CALTRATE 600+D) 600-400 MG-UNIT per tablet Take 1 tablet by mouth daily.    . cholecalciferol (VITAMIN D) 1000 UNITS tablet Take 1,000 Units by mouth daily.    Marland Kitchen denosumab (PROLIA) 60 MG/ML SOLN injection Inject 60 mg into the skin every 6 (six) months. Given in  May and Nov. - Administer in upper arm, thigh, or abdomen    . fluticasone (FLONASE) 50 MCG/ACT nasal spray Place 2 sprays into both nostrils 2 (two) times daily. 16 g 3  . Multiple Vitamin (MULTIVITAMIN WITH MINERALS) TABS Take 1 tablet by mouth daily.    . Multiple Vitamins-Minerals (PRESERVISION AREDS 2) CAPS Take by mouth.    Marland Kitchen omeprazole (PRILOSEC) 20 MG capsule Take 1 capsule (20 mg total) by mouth daily. 30 capsule 4  . OVER THE COUNTER MEDICATION Take 1 tablet by mouth daily. Called mucus relief,  generic of mucinex dm    . Respiratory Therapy Supplies (FLUTTER) DEVI Use as directed 1 each 0  . sodium chloride HYPERTONIC 3 % nebulizer solution Take two times daily prior to Flutter Valve use 750 mL 12  . Calcium Carbonate-Vit D-Min (CALCIUM 600+D PLUS MINERALS) 600-400 MG-UNIT TABS Take by mouth.    . latanoprost (XALATAN) 0.005 % ophthalmic solution 1 drop at bedtime.     No current facility-administered medications for this encounter.    REVIEW OF SYSTEMS:  Notable for that above.   PHYSICAL EXAM:  vitals were not taken for this visit.   Not performed in light of telephone encounter.     LABORATORY DATA:  Lab Results  Component Value Date   WBC 11.4 (H) 07/01/2017   HGB 12.9 07/01/2017   HCT 38.5 07/01/2017   MCV 98.2 07/01/2017   PLT 244.0 07/01/2017   CMP     Component Value Date/Time   NA 139 12/15/2011 1003   K 4.4 12/15/2011 1003   CL 103 12/15/2011 1003   CO2 28 12/15/2011 1003   GLUCOSE 100 (H) 12/15/2011 1003   BUN 19 12/15/2011 1003   CREATININE 1.02 12/15/2011 1003   CALCIUM 9.4 12/15/2011 1003   PROT 6.5 12/15/2011 1003   ALBUMIN 4.0 12/15/2011 1003   AST 23 12/15/2011 1003   ALT 22 12/15/2011 1003   ALKPHOS 59 12/15/2011 1003   BILITOT 0.4 12/15/2011 1003   GFRNONAA 48 (L) 12/15/2011 1003   GFRAA 56 (L) 12/15/2011 1003      No results found for: TSH   RADIOGRAPHY: As above   IMPRESSION/PLAN: I had a lengthy discussion with the patient and her daughter about her condition.  I explained that radiotherapy for CLL is given with palliative intent and usually held for masses that are bothersome or affecting patient's quality of life.  She is not bothered by the masses in her neck and they are not particularly bulky.  If she is to pursue radiotherapy in the future it would be given over 2 fractions of 2 Gy each and would likely make the masses shrink substantially.  This regimen could be repeated multiple times in the future if the mass is ever  regrow.  At this time I do not think that radiotherapy is necessary and the patient agrees.  However I do recommend a medical oncology consultation with appropriate lab work to discern if any systemic therapy is warranted.   She understands that her condition often behaves in a relatively indolent manner.  Referral will be made to Dr. Lorenso Courier.  The patient and her daughter were very appreciative of our conversation.  They have contact information for Gayleen Orem, RN, our Head and Neck Oncology Navigator Who is also present for the consultation.  They know to contact him if any questions or concerns arise in the future.  I will see her back on a as needed basis.  She will also be discussed tomorrow at tumor board.   This encounter was provided by telemedicine platform by telephone as patient was unable to access MyChart video during pandemic precautions. The patient has given verbal consent for this type of encounter and has been advised to only accept a meeting of this type in a secure network environment. The total time spent during this encounter on the date of the encounter was 60 minutes. The attendants for this meeting include Eppie Gibson  and [facility].  During the encounter, Eppie Gibson was located at [facility] Radiation Oncology Department.  JASMINN CONES was located at home.   __________________________________________   Eppie Gibson, MD   This document serves as a record of services personally performed by Eppie Gibson, MD. It was created on her behalf by Wilburn Mylar, a trained medical scribe. The creation of this record is based on the scribe's personal observations and the provider's statements to them. This document has been checked and approved by the attending provider.

## 2019-02-01 ENCOUNTER — Other Ambulatory Visit: Payer: Self-pay

## 2019-02-01 ENCOUNTER — Encounter: Payer: Self-pay | Admitting: Radiation Oncology

## 2019-02-01 ENCOUNTER — Encounter: Payer: Self-pay | Admitting: *Deleted

## 2019-02-01 ENCOUNTER — Ambulatory Visit
Admission: RE | Admit: 2019-02-01 | Discharge: 2019-02-01 | Disposition: A | Discharge status: Home / Self Care | Payer: Medicare Other | Source: Ambulatory Visit | Attending: Radiation Oncology | Admitting: Radiation Oncology

## 2019-02-01 DIAGNOSIS — C9110 Chronic lymphocytic leukemia of B-cell type not having achieved remission: Secondary | ICD-10-CM

## 2019-02-01 DIAGNOSIS — C8300 Small cell B-cell lymphoma, unspecified site: Secondary | ICD-10-CM

## 2019-02-01 DIAGNOSIS — R221 Localized swelling, mass and lump, neck: Secondary | ICD-10-CM

## 2019-02-02 ENCOUNTER — Telehealth: Payer: Self-pay | Admitting: Hematology

## 2019-02-02 ENCOUNTER — Encounter: Payer: Self-pay | Admitting: Radiation Oncology

## 2019-02-02 DIAGNOSIS — C9110 Chronic lymphocytic leukemia of B-cell type not having achieved remission: Secondary | ICD-10-CM | POA: Insufficient documentation

## 2019-02-02 NOTE — Telephone Encounter (Signed)
Received a new patient referral from Dr. Isidore Moos for Mary Kim to be seen for small lymphocytic lymphoma. She has been cld and scheduled to see Mary Kim on 1/12 at 1pm. Pt aware to arrive 15 minutes early.

## 2019-02-03 ENCOUNTER — Institutional Professional Consult (permissible substitution): Payer: Commercial Indemnity | Admitting: Adult Health

## 2019-02-07 NOTE — Progress Notes (Signed)
HEMATOLOGY/ONCOLOGY CONSULTATION NOTE  Date of Service: 02/08/2019  Patient Care Team: Shon Baton, MD as PCP - General (Internal Medicine) Shon Baton, MD (Internal Medicine) Shon Baton, MD (Internal Medicine) Izora Gala, MD as Consulting Physician (Otolaryngology) Eppie Gibson, MD as Attending Physician (Radiation Oncology) Leota Sauers, RN as Oncology Nurse Navigator  CHIEF COMPLAINTS/PURPOSE OF CONSULTATION:  Newly diagnosed Small Lymphocytic Lymphoma  HISTORY OF PRESENTING ILLNESS:   Mary Kim is a wonderful 86 y.o. female who has been referred to [place] by Dr Isidore Moos for evaluation and management of small lymphocytic lymphoma. Pt is accompanied today by her daughter. The pt reports that she is doing well overall.  The pt reports that she was washing her face about 3 months ago and noticed a lump under her chin, near her neck. She spoke to her PCP, Dr. Virgina Jock, who thought that it was a stone and referred pt to Dr. Constance Holster, ENT. Dr. Constance Holster then ordered a neck CT and a lymph node biopsy.    Pt denies feeing any different in the last 6 months to a year. She reports that she has been healthy in general and her other conditions are stable at this time. She has already had one dose of COVID19 vaccine and will receive the second in February. Pt has had her annual flu vaccine but is unsure of the last time she had her pneumonia vaccines. Pt would not like to begin treatment if it causes symptoms, as she is not currently burdened by symptoms from the disease.   Of note prior to the patient's visit today, pt has had Flow Pathology ([id number]) completed on 01/10/2019 with results revealing "- Monoclonal B-cell population identified, see comment."  Pt has had Surgical Pathology Report ([id number]) completed on 01/10/2019 with results revealing "LYMPH NODE, LEFT CERVICAL, EXCISION: - Chronic lymphocytic leukemia/small lymphocytic lymphoma."   Pt has had CT Soft Tissue Neck  ([id number]) completed on 12/08/2018 with results revealing "Cervical lymphadenopathy at both the upper and lower nodal stations bilaterally. An index left level 2 lymph node measures 2.4 x 2.0 cm, likely accounting for the palpable submandibular abnormality. Findings are highly suspicious for a lymphoproliferative process or metastatic nodal disease."  Most recent lab results (07/01/2017) of CBC is as follows: all values are WNL except for WBC at 11.4K, Neutro Rel at 41.5, Lymphs Rel at 54.3, Lymphs Abs at 6.2K.  On review of systems, pt denies fevers, chills, night sweats, unexpected weight loss, loss of appetite, back pain, abdominal pain, leg swelling and any other symptoms.   On PMHx the pt reports HLD, GERD, HTN, Osteoporosis.  MEDICAL HISTORY:  Past Medical History:  Diagnosis Date  . Chronic cough   . Elevated cholesterol   . GERD (gastroesophageal reflux disease)   . Hearing loss    left hearing aid  . Hyperlipidemia   . Hypertension   . Osteoporosis     SURGICAL HISTORY: Past Surgical History:  Procedure Laterality Date  . CATARACT EXTRACTION  2011  . LYMPH NODE BIOPSY Left 01/10/2019   Procedure: EXCISIONAL BIOPSY OF CERVICAL LYMPH NODE/LEFT;  Surgeon: Izora Gala, MD;  Location: [facility];  Service: ENT;  Laterality: Left;    SOCIAL HISTORY: Social History   Socioeconomic History  . Marital status: Widowed    Spouse name: Not on file  . Number of children: Not on file  . Years of education: Not on file  . Highest education level: Not on file  Occupational History  .  Not on file  Tobacco Use  . Smoking status: Never Smoker  . Smokeless tobacco: Never Used  Substance and Sexual Activity  . Alcohol use: No  . Drug use: No  . Sexual activity: Never  Other Topics Concern  . Not on file  Social History Narrative   ** Merged History Encounter    Pt states recent bronchial diagnosis from Dr Melvyn Novas but unable to recall specific        Social  Determinants of Health   Financial Resource Strain:   . Difficulty of Paying Living Expenses: Not on file  Food Insecurity:   . Worried About Charity fundraiser in the Last Year: Not on file  . Ran Out of Food in the Last Year: Not on file  Transportation Needs:   . Lack of Transportation (Medical): Not on file  . Lack of Transportation (Non-Medical): Not on file  Physical Activity:   . Days of Exercise per Week: Not on file  . Minutes of Exercise per Session: Not on file  Stress:   . Feeling of Stress : Not on file  Social Connections:   . Frequency of Communication with Friends and Family: Not on file  . Frequency of Social Gatherings with Friends and Family: Not on file  . Attends Religious Services: Not on file  . Active Member of Clubs or Organizations: Not on file  . Attends Archivist Meetings: Not on file  . Marital Status: Not on file  Intimate Partner Violence:   . Fear of Current or Ex-Partner: Not on file  . Emotionally Abused: Not on file  . Physically Abused: Not on file  . Sexually Abused: Not on file    FAMILY HISTORY: No family history on file.  ALLERGIES:  has No Known Allergies.  MEDICATIONS:  Current Outpatient Medications  Medication Sig Dispense Refill  . amLODipine (NORVASC) 10 MG tablet Take 10 mg by mouth daily.    Marland Kitchen aspirin 81 MG tablet Take 81 mg by mouth daily.    Marland Kitchen atorvastatin (LIPITOR) 20 MG tablet Take 20 mg by mouth daily.    . Calcium Carbonate-Vitamin D (CALTRATE 600+D) 600-400 MG-UNIT per tablet Take 1 tablet by mouth daily.    . cholecalciferol (VITAMIN D) 1000 UNITS tablet Take 1,000 Units by mouth daily.    Marland Kitchen denosumab (PROLIA) 60 MG/ML SOLN injection Inject 60 mg into the skin every 6 (six) months. Given in May and Nov. - Administer in upper arm, thigh, or abdomen    . fluticasone (FLONASE) 50 MCG/ACT nasal spray Place 2 sprays into both nostrils 2 (two) times daily. 16 g 3  . latanoprost (XALATAN) 0.005 % ophthalmic  solution 1 drop at bedtime.    . Multiple Vitamin (MULTIVITAMIN WITH MINERALS) TABS Take 1 tablet by mouth daily.    . Multiple Vitamins-Minerals (PRESERVISION AREDS 2) CAPS Take by mouth.    Marland Kitchen omeprazole (PRILOSEC) 20 MG capsule Take 1 capsule (20 mg total) by mouth daily. 30 capsule 4  . OVER THE COUNTER MEDICATION Take 1 tablet by mouth daily. Called mucus relief, generic of mucinex dm    . Respiratory Therapy Supplies (FLUTTER) DEVI Use as directed 1 each 0  . sodium chloride HYPERTONIC 3 % nebulizer solution Take two times daily prior to Flutter Valve use 750 mL 12   No current facility-administered medications for this visit.    REVIEW OF SYSTEMS:    10 Point review of Systems was done is negative except as  noted above.  PHYSICAL EXAMINATION: ECOG PERFORMANCE STATUS: 0 - Asymptomatic  . Vitals:   02/08/19 1321  BP: (!) 161/77  Pulse: 97  Resp: 18  Temp: 98.5 F (36.9 C)  SpO2: 98%   Filed Weights   02/08/19 1321  Weight: 105 lb 4.8 oz (47.8 kg)   .Body mass index is 17.52 kg/m.  GENERAL:alert, in no acute distress and comfortable SKIN: no acute rashes, no significant lesions, healing surgical incision in left mandibular area EYES: conjunctiva are pink and non-injected, sclera anicteric OROPHARYNX: MMM, no exudates, no oropharyngeal erythema or ulceration NECK: supple, no JVD LYMPH:  no palpable lymphadenopathy in the inguinal region. small lymph node in right axillary, few slightly enlarged lymph nodes in cervical region b/l LUNGS: clear to auscultation b/l with normal respiratory effort HEART: regular rate & rhythm ABDOMEN:  normoactive bowel sounds , non tender, not distended. barely palpable spleen Extremity: no pedal edema PSYCH: alert & oriented x 3 with fluent speech NEURO: no focal motor/sensory deficits  LABORATORY DATA:  I have reviewed the data as listed  . CBC Latest Ref Rng & Units 02/08/2019 07/01/2017 12/15/2011  WBC 4.0 - 10.5 K/uL 13.6(H)  11.4(H) 7.8  Hemoglobin 12.0 - 15.0 g/dL 11.1(L) 12.9 12.3  Hematocrit 36.0 - 46.0 % 34.5(L) 38.5 36.7  Platelets 150 - 400 K/uL 141(L) 244.0 183    . CMP Latest Ref Rng & Units 02/08/2019 12/15/2011  Glucose 70 - 99 mg/dL 93 100(H)  BUN 8 - 23 mg/dL 26(H) 19  Creatinine 0.44 - 1.00 mg/dL 0.90 1.02  Sodium 135 - 145 mmol/L 138 139  Potassium 3.5 - 5.1 mmol/L 4.3 4.4  Chloride 98 - 111 mmol/L 104 103  CO2 22 - 32 mmol/L 25 28  Calcium 8.9 - 10.3 mg/dL 9.1 9.4  Total Protein 6.5 - 8.1 g/dL 6.6 6.5  Total Bilirubin 0.3 - 1.2 mg/dL 0.3 0.4  Alkaline Phos 38 - 126 U/L 100 59  AST 15 - 41 U/L 31 23  ALT 0 - 44 U/L 31 22   01/10/2019 Surgical Pathology Report ([id number]):   01/10/2019 Flow Pathology Report ([id number]):   RADIOGRAPHIC STUDIES: I have personally reviewed the radiological images as listed and agreed with the findings in the report. No results found.  ASSESSMENT & PLAN:   86 yo with   1) Newly diagnosed CLL/SLL PLAN: -Discussed patient's most recent labs from 07/01/2017, all values are WNL except for WBC at 11.4K, Neutro Rel at 41.5, Lymphs Rel at 54.3, Lymphs Abs at 6.2K. -Discussed 12/08/2018 CT Soft Tissue Neck ([id number]) which revealed "Cervical lymphadenopathy at both the upper and lower nodal stations bilaterally. An index left level 2 lymph node measures 2.4 x 2.0 cm, likely accounting for the palpable submandibular abnormality. Findings are highly suspicious for a lymphoproliferative process or metastatic nodal disease." -Discussed 01/10/2019 Surgical Pathology Report ([id number]) which revealed "Chronic lymphocytic leukemia/small lymphocytic lymphoma."  -Discussed 01/10/2019 Flow Pathology ([id number]) which revealed "Monoclonal B-cell population identified" -Advised pt that she has a slow-growing Non-Hodgkin's Lymphoma  -Advised pt that there is no evidence of better health outcomes when treating slow-growing lymphomas before symptoms become  bothersome -Advised pt that we would move to treat if lymphoma is causing: cytopenias, constitutional symptoms, bulky disease, or threatened end organs  -Advised pt of constitutional symptoms to be aware of  -Will consider PET/CT scan in 4 months  -Advised pt to continue to f/u with PCP for appropriate vaccinations -Will get labs today  -Will get genetic tests for  risk stratification -Will see back in 4 months with labs -Pt advised to contact if any new symptoms or concerns    FOLLOW UP: Labs today RTC with Dr Irene Limbo with labs in 4 months   All of the patients questions were answered with apparent satisfaction. The patient knows to call the clinic with any problems, questions or concerns.  I spent 40 mins counseling the patient face to face. The total time spent in the appointment was 60 minutes and more than 50% was on counseling and direct patient cares.    Sullivan Lone MD [place] AAHIVMS [facility] Hematology/Oncology Physician [facility]  (Office):       [phone] (Work cell):  [phone] (Fax):           [phone]  02/08/2019 4:39 PM  I, Yevette Edwards, am acting as a scribe for Dr. Sullivan Lone.   .I have reviewed the above documentation for accuracy and completeness, and I agree with the above. Brunetta Genera MD

## 2019-02-08 ENCOUNTER — Other Ambulatory Visit: Payer: Self-pay

## 2019-02-08 ENCOUNTER — Inpatient Hospital Stay: Payer: Medicare Other

## 2019-02-08 ENCOUNTER — Inpatient Hospital Stay: Payer: Medicare Other | Attending: Hematology | Admitting: Hematology

## 2019-02-08 ENCOUNTER — Telehealth: Payer: Self-pay | Admitting: Hematology

## 2019-02-08 VITALS — BP 161/77 | HR 97 | Temp 98.5°F | Resp 18 | Ht 65.0 in | Wt 105.3 lb

## 2019-02-08 DIAGNOSIS — C9110 Chronic lymphocytic leukemia of B-cell type not having achieved remission: Secondary | ICD-10-CM

## 2019-02-08 DIAGNOSIS — I10 Essential (primary) hypertension: Secondary | ICD-10-CM | POA: Diagnosis not present

## 2019-02-08 LAB — CBC WITH DIFFERENTIAL/PLATELET
Abs Immature Granulocytes: 0 10*3/uL (ref 0.00–0.07)
Basophils Absolute: 0 10*3/uL (ref 0.0–0.1)
Basophils Relative: 0 %
Eosinophils Absolute: 0.3 10*3/uL (ref 0.0–0.5)
Eosinophils Relative: 2 %
HCT: 34.5 % — ABNORMAL LOW (ref 36.0–46.0)
Hemoglobin: 11.1 g/dL — ABNORMAL LOW (ref 12.0–15.0)
Lymphocytes Relative: 80 %
Lymphs Abs: 10.9 10*3/uL — ABNORMAL HIGH (ref 0.7–4.0)
MCH: 33.1 pg (ref 26.0–34.0)
MCHC: 32.2 g/dL (ref 30.0–36.0)
MCV: 103 fL — ABNORMAL HIGH (ref 80.0–100.0)
Monocytes Absolute: 0.1 10*3/uL (ref 0.1–1.0)
Monocytes Relative: 1 %
Neutro Abs: 2.3 10*3/uL (ref 1.7–7.7)
Neutrophils Relative %: 17 %
Platelets: 141 10*3/uL — ABNORMAL LOW (ref 150–400)
RBC: 3.35 MIL/uL — ABNORMAL LOW (ref 3.87–5.11)
RDW: 13.5 % (ref 11.5–15.5)
WBC: 13.6 10*3/uL — ABNORMAL HIGH (ref 4.0–10.5)
nRBC: 0 % (ref 0.0–0.2)

## 2019-02-08 LAB — CMP (CANCER CENTER ONLY)
ALT: 31 U/L (ref 0–44)
AST: 31 U/L (ref 15–41)
Albumin: 3.8 g/dL (ref 3.5–5.0)
Alkaline Phosphatase: 100 U/L (ref 38–126)
Anion gap: 9 (ref 5–15)
BUN: 26 mg/dL — ABNORMAL HIGH (ref 8–23)
CO2: 25 mmol/L (ref 22–32)
Calcium: 9.1 mg/dL (ref 8.9–10.3)
Chloride: 104 mmol/L (ref 98–111)
Creatinine: 0.9 mg/dL (ref 0.44–1.00)
GFR, Est AFR Am: >60 mL/min (ref >60)
GFR, Estimated: 55 mL/min — ABNORMAL LOW (ref >60)
Glucose, Bld: 93 mg/dL (ref 70–99)
Potassium: 4.3 mmol/L (ref 3.5–5.1)
Sodium: 138 mmol/L (ref 135–145)
Total Bilirubin: 0.3 mg/dL (ref 0.3–1.2)
Total Protein: 6.6 g/dL (ref 6.5–8.1)

## 2019-02-08 LAB — RETICULOCYTES
Immature Retic Fract: 12.2 % (ref 2.3–15.9)
RBC.: 3.27 MIL/uL — ABNORMAL LOW (ref 3.87–5.11)
Retic Count, Absolute: 60.2 10*3/uL (ref 19.0–186.0)
Retic Ct Pct: 1.8 % (ref 0.4–3.1)

## 2019-02-08 LAB — LACTATE DEHYDROGENASE: LDH: 220 U/L — ABNORMAL HIGH (ref 98–192)

## 2019-02-08 NOTE — Telephone Encounter (Signed)
Gave avs and calendar ° °

## 2019-02-16 NOTE — Progress Notes (Signed)
Oncology Nurse Navigator Documentation  Met with Ms. Mary Kim during Telephone Consult with Dr. Isidore Moos.  She was joined by her daughter [name]. They voiced understanding of:  The characteristics of her lymphoma, non-urgent need for treatment.  If RT needed in the future, a few fractions of low-dose XRT can be provided.  Referral to be made to Medical Oncology for further evaluation. I encouraged them to call me with questions/concerns moving forward.  Gayleen Orem, RN, BSN Head & Neck Oncology Nurse [place] [facility] [phone]

## 2019-02-28 DIAGNOSIS — Z23 Encounter for immunization: Secondary | ICD-10-CM | POA: Diagnosis not present

## 2019-04-07 ENCOUNTER — Telehealth: Payer: Self-pay | Admitting: Internal Medicine

## 2019-04-07 DIAGNOSIS — R053 Chronic cough: Secondary | ICD-10-CM

## 2019-04-07 DIAGNOSIS — R05 Cough: Secondary | ICD-10-CM

## 2019-04-07 DIAGNOSIS — J479 Bronchiectasis, uncomplicated: Secondary | ICD-10-CM

## 2019-04-07 MED ORDER — SODIUM CHLORIDE 3 % IN NEBU: INHALATION_SOLUTION | RESPIRATORY_TRACT | 6 refills | Status: DC

## 2019-04-07 NOTE — Telephone Encounter (Signed)
Rx for hypertonic solution was sent to [facility]. I advised Tonya at the [facility] to advise pt. Nothing further is needed.

## 2019-05-05 ENCOUNTER — Encounter (HOSPITAL_COMMUNITY): Payer: Self-pay

## 2019-05-05 ENCOUNTER — Other Ambulatory Visit: Payer: Self-pay

## 2019-05-05 ENCOUNTER — Ambulatory Visit (HOSPITAL_COMMUNITY)
Admission: EM | Admit: 2019-05-05 | Discharge: 2019-05-05 | Disposition: A | Discharge status: Home / Self Care | Payer: Medicare Other | Attending: Family Medicine | Admitting: Family Medicine

## 2019-05-05 DIAGNOSIS — R233 Spontaneous ecchymoses: Secondary | ICD-10-CM | POA: Diagnosis not present

## 2019-05-05 HISTORY — DX: Non-Hodgkin lymphoma, unspecified, unspecified site: C85.90

## 2019-05-05 LAB — CBC WITH DIFFERENTIAL/PLATELET
Abs Immature Granulocytes: 0 10*3/uL (ref 0.00–0.07)
Basophils Absolute: 0 10*3/uL (ref 0.0–0.1)
Basophils Relative: 0 %
Eosinophils Absolute: 0.1 10*3/uL (ref 0.0–0.5)
Eosinophils Relative: 1 %
HCT: 34.8 % — ABNORMAL LOW (ref 36.0–46.0)
Hemoglobin: 11.5 g/dL — ABNORMAL LOW (ref 12.0–15.0)
Lymphocytes Relative: 69 %
Lymphs Abs: 9.5 10*3/uL — ABNORMAL HIGH (ref 0.7–4.0)
MCH: 33.6 pg (ref 26.0–34.0)
MCHC: 33 g/dL (ref 30.0–36.0)
MCV: 101.8 fL — ABNORMAL HIGH (ref 80.0–100.0)
Monocytes Absolute: 0.3 10*3/uL (ref 0.1–1.0)
Monocytes Relative: 2 %
Neutro Abs: 3.8 10*3/uL (ref 1.7–7.7)
Neutrophils Relative %: 28 %
Platelets: 144 10*3/uL — ABNORMAL LOW (ref 150–400)
RBC: 3.42 MIL/uL — ABNORMAL LOW (ref 3.87–5.11)
RDW: 14.2 % (ref 11.5–15.5)
WBC: 13.7 10*3/uL — ABNORMAL HIGH (ref 4.0–10.5)
nRBC: 0 % (ref 0.0–0.2)

## 2019-05-05 NOTE — ED Triage Notes (Addendum)
Pt c/o acute onset bruising and "blackness" to bilateral lower legs last week. Pt denies pain or swelling, numbness to feet. Pt states discoloration has improved slightly with use of warm compresses. Denies CP, Sob or other complaint. Pt states she was just diagnosed with lymphoma.

## 2019-05-05 NOTE — ED Provider Notes (Signed)
[facility]    CSN: [id number] Arrival date & time: 05/05/19  1122      History   Chief Complaint Chief Complaint  Patient presents with  . leg ecchymosis    HPI Mary Kim is a 86 y.o. female history of hypertension, hyperlipidemia, osteoporosis, newly diagnosed CLL presenting today for evaluation of lower leg discoloration.  Patient notes that she has had purple/red discoloration to bilateral lower legs.  Denies any associated pain or swelling.  Denies any injury or trauma.  Cannot recall any fall prior to onset of symptoms.  Began to notice approximately 1 week ago, has felt symptoms have slightly improved since.  Has been applying warm compresses.  Takes a daily aspirin, denies other anticoagulants.  Denies history of similar.  Patient has not started treatment for her CLL, has plans to follow-up with heme-onc in 3 weeks.  Denies systemic symptoms of fevers, nausea or vomiting, dizziness or lightheadedness.  Denies any new medicines.  HPI  Past Medical History:  Diagnosis Date  . Chronic cough   . Elevated cholesterol   . GERD (gastroesophageal reflux disease)   . Hearing loss    left hearing aid  . Hyperlipidemia   . Hypertension   . Lymphoma ([place])   . Osteoporosis     Patient Active Problem List   Diagnosis Date Noted  . CLL (chronic lymphocytic leukemia) ([place]) 02/02/2019  . Dizziness 03/23/2018  . Allergic rhinitis 11/10/2017  . Eustachian tube dysfunction, unspecified laterality 11/10/2017  . Obstructive bronchiectasis ([place]) 07/08/2017  . Chronic cough 07/01/2017    Past Surgical History:  Procedure Laterality Date  . CATARACT EXTRACTION  2011  . LYMPH NODE BIOPSY Left 01/10/2019   Procedure: EXCISIONAL BIOPSY OF CERVICAL LYMPH NODE/LEFT;  Surgeon: Izora Gala, MD;  Location: [facility];  Service: ENT;  Laterality: Left;    OB History    Gravida  0   Para  0   Term  0   Preterm  0   AB  0   Living        SAB  0   TAB  0   Ectopic  0   Multiple      Live Births               Home Medications    Prior to Admission medications   Medication Sig Start Date End Date Taking? Authorizing Provider  amLODipine (NORVASC) 10 MG tablet Take 10 mg by mouth daily.   Yes [provider]  aspirin 81 MG tablet Take 81 mg by mouth daily.   Yes [provider]  atorvastatin (LIPITOR) 20 MG tablet Take 20 mg by mouth daily.   Yes [provider]  Calcium Carbonate-Vitamin D (CALTRATE 600+D) 600-400 MG-UNIT per tablet Take 1 tablet by mouth daily.   Yes [provider]  cholecalciferol (VITAMIN D) 1000 UNITS tablet Take 1,000 Units by mouth daily.   Yes [provider]  fluticasone (FLONASE) 50 MCG/ACT nasal spray Place 2 sprays into both nostrils 2 (two) times daily. 11/10/17  Yes Lauraine Rinne, NP  latanoprost (XALATAN) 0.005 % ophthalmic solution 1 drop at bedtime. 11/10/18  Yes [provider]  omeprazole (PRILOSEC) 20 MG capsule Take 1 capsule (20 mg total) by mouth daily. 11/10/17  Yes Lauraine Rinne, NP  denosumab (PROLIA) 60 MG/ML SOLN injection Inject 60 mg into the skin every 6 (six) months. Given in May and Nov. - Administer in upper arm,  thigh, or abdomen    [provider]  Multiple Vitamin (MULTIVITAMIN WITH MINERALS) TABS Take 1 tablet by mouth daily.    [provider]  Multiple Vitamins-Minerals (PRESERVISION AREDS 2) CAPS Take by mouth.    [provider]  OVER THE COUNTER MEDICATION Take 1 tablet by mouth daily. Called mucus relief, generic of mucinex dm    [provider]  Respiratory Therapy Supplies (FLUTTER) DEVI Use as directed 07/01/17   Tanda Rockers, MD  sodium chloride HYPERTONIC 3 % nebulizer solution Take two times daily prior to Flutter Valve use 04/07/19   Tanda Rockers, MD    Family History History reviewed. No pertinent family history.  Social History Social History   Tobacco Use  .  Smoking status: Never Smoker  . Smokeless tobacco: Never Used  Substance Use Topics  . Alcohol use: No  . Drug use: No     Allergies   Patient has no known allergies.   Review of Systems Review of Systems  Constitutional: Negative for fatigue and fever.  HENT: Negative for mouth sores.   Eyes: Negative for visual disturbance.  Respiratory: Negative for shortness of breath.   Cardiovascular: Negative for chest pain.  Gastrointestinal: Negative for abdominal pain, nausea and vomiting.  Musculoskeletal: Negative for arthralgias and joint swelling.  Skin: Positive for color change. Negative for rash and wound.  Neurological: Negative for dizziness, weakness, light-headedness and headaches.     Physical Exam Triage Vital Signs ED Triage Vitals  Enc Vitals Group     BP 05/05/19 1134 (!) 173/91     Pulse Rate 05/05/19 1134 (!) 104     Resp 05/05/19 1134 18     Temp 05/05/19 1134 97.8 F (36.6 C)     Temp Source 05/05/19 1134 Oral     SpO2 05/05/19 1134 97 %     Weight --      Height --      Head Circumference --      Peak Flow --      Pain Score 05/05/19 1132 0     Pain Loc --      Pain Edu? --      Excl. in [place]? --    No data found.  Updated Vital Signs BP (!) 173/91 (BP Location: Right Arm)   Pulse (!) 104   Temp 97.8 F (36.6 C) (Oral)   Resp 18   SpO2 97%   Visual Acuity Right Eye Distance:   Left Eye Distance:   Bilateral Distance:    Right Eye Near:   Left Eye Near:    Bilateral Near:      Physical Exam Vitals and nursing note reviewed.  Constitutional:      Appearance: She is well-developed.     Comments: No acute distress  HENT:     Head: Normocephalic and atraumatic.     Nose: Nose normal.     Mouth/Throat:     Comments: No lesions on oral mucosa Eyes:     Conjunctiva/sclera: Conjunctivae normal.  Cardiovascular:     Rate and Rhythm: Normal rate and regular rhythm.  Pulmonary:     Effort: Pulmonary effort is normal. No respiratory  distress.     Comments: Breathing comfortably at rest, CTABL, no wheezing, rales or other adventitious sounds auscultated Abdominal:     General: There is no distension.  Musculoskeletal:        General: Normal range of motion.     Cervical back: Neck  supple.  Skin:    General: Skin is warm and dry.     Comments: Bilateral anterior shins with hyperpigmented and purplish discoloration, smaller areas of more erythematous petechial lesions, no discoloration extending onto feet, soles or more proximally above knees  No lesions noted to trunk, hands or palms    Neurological:     Mental Status: She is alert and oriented to person, place, and time.         UC Treatments / Results  Labs (all labs ordered are listed, but only abnormal results are displayed) Labs Reviewed  CBC WITH DIFFERENTIAL/PLATELET    EKG   Radiology No results found.  Procedures Procedures (including critical care time)  Medications Ordered in UC Medications - No data to display  Initial Impression / Assessment and Plan / UC Course  I have reviewed the triage vital signs and the nursing notes.  Pertinent labs & imaging results that were available during my care of the patient were reviewed by me and considered in my medical decision making (see chart for details).     Exam and symptoms not suggestive of underlying systemic cause, likely due to thinness of skin from some mild irritation/trauma to the area causing some bruising.  Will check CBC to ensure platelets stable.  Otherwise continue to monitor for continued gradual healing.  Discussed strict return precautions. Patient verbalized understanding and is agreeable with plan.  Final Clinical Impressions(s) / UC Diagnoses   Final diagnoses:  Spontaneous ecchymosis     Discharge Instructions     We are checking your blood and platelet levels to ensure this is stable- I will call with results later today  Please continue warm compresses and  monitoring for continued resolution  I suspect this is likely related to some mild irritation to leg areas, since this is localized I am less suspicious of this being caused from a more systemic issue  Please follow up if symptoms changing or worsening    ED Prescriptions    None     PDMP not reviewed this encounter.   Janith Lima, PA-C 05/05/19 1240

## 2019-05-05 NOTE — Discharge Instructions (Signed)
We are checking your blood and platelet levels to ensure this is stable- I will call with results later today  Please continue warm compresses and monitoring for continued resolution  I suspect this is likely related to some mild irritation to leg areas, since this is localized I am less suspicious of this being caused from a more systemic issue  Please follow up if symptoms changing or worsening

## 2019-05-06 LAB — PATHOLOGIST SMEAR REVIEW

## 2019-05-10 DIAGNOSIS — D692 Other nonthrombocytopenic purpura: Secondary | ICD-10-CM | POA: Diagnosis not present

## 2019-05-10 DIAGNOSIS — N1831 Chronic kidney disease, stage 3a: Secondary | ICD-10-CM | POA: Diagnosis not present

## 2019-05-10 DIAGNOSIS — D61818 Other pancytopenia: Secondary | ICD-10-CM | POA: Diagnosis not present

## 2019-05-10 DIAGNOSIS — D485 Neoplasm of uncertain behavior of skin: Secondary | ICD-10-CM | POA: Diagnosis not present

## 2019-05-10 DIAGNOSIS — D229 Melanocytic nevi, unspecified: Secondary | ICD-10-CM | POA: Diagnosis not present

## 2019-05-10 DIAGNOSIS — E46 Unspecified protein-calorie malnutrition: Secondary | ICD-10-CM | POA: Diagnosis not present

## 2019-05-10 DIAGNOSIS — H9190 Unspecified hearing loss, unspecified ear: Secondary | ICD-10-CM | POA: Diagnosis not present

## 2019-05-10 DIAGNOSIS — J479 Bronchiectasis, uncomplicated: Secondary | ICD-10-CM | POA: Diagnosis not present

## 2019-05-10 DIAGNOSIS — I872 Venous insufficiency (chronic) (peripheral): Secondary | ICD-10-CM | POA: Diagnosis not present

## 2019-05-10 DIAGNOSIS — D72829 Elevated white blood cell count, unspecified: Secondary | ICD-10-CM | POA: Diagnosis not present

## 2019-05-10 DIAGNOSIS — I129 Hypertensive chronic kidney disease with stage 1 through stage 4 chronic kidney disease, or unspecified chronic kidney disease: Secondary | ICD-10-CM | POA: Diagnosis not present

## 2019-05-10 DIAGNOSIS — C9100 Acute lymphoblastic leukemia not having achieved remission: Secondary | ICD-10-CM | POA: Diagnosis not present

## 2019-05-26 ENCOUNTER — Other Ambulatory Visit (HOSPITAL_COMMUNITY): Payer: Self-pay | Admitting: *Deleted

## 2019-05-26 DIAGNOSIS — L821 Other seborrheic keratosis: Secondary | ICD-10-CM | POA: Diagnosis not present

## 2019-05-26 DIAGNOSIS — C44619 Basal cell carcinoma of skin of left upper limb, including shoulder: Secondary | ICD-10-CM | POA: Diagnosis not present

## 2019-05-26 DIAGNOSIS — D485 Neoplasm of uncertain behavior of skin: Secondary | ICD-10-CM | POA: Diagnosis not present

## 2019-05-27 ENCOUNTER — Encounter (HOSPITAL_COMMUNITY): Payer: Commercial Indemnity

## 2019-05-27 ENCOUNTER — Encounter (HOSPITAL_COMMUNITY): Payer: Self-pay

## 2019-06-07 ENCOUNTER — Other Ambulatory Visit: Payer: Self-pay | Admitting: *Deleted

## 2019-06-07 DIAGNOSIS — H401123 Primary open-angle glaucoma, left eye, severe stage: Secondary | ICD-10-CM | POA: Diagnosis not present

## 2019-06-07 DIAGNOSIS — C9110 Chronic lymphocytic leukemia of B-cell type not having achieved remission: Secondary | ICD-10-CM

## 2019-06-07 DIAGNOSIS — H04123 Dry eye syndrome of bilateral lacrimal glands: Secondary | ICD-10-CM | POA: Diagnosis not present

## 2019-06-08 ENCOUNTER — Inpatient Hospital Stay: Payer: Medicare Other | Attending: Hematology

## 2019-06-08 ENCOUNTER — Inpatient Hospital Stay (HOSPITAL_BASED_OUTPATIENT_CLINIC_OR_DEPARTMENT_OTHER): Payer: Medicare Other | Admitting: Hematology

## 2019-06-08 ENCOUNTER — Telehealth: Payer: Self-pay | Admitting: Hematology

## 2019-06-08 ENCOUNTER — Other Ambulatory Visit: Payer: Self-pay

## 2019-06-08 VITALS — BP 159/78 | HR 84 | Temp 98.5°F | Resp 15 | Wt 106.9 lb

## 2019-06-08 DIAGNOSIS — C9110 Chronic lymphocytic leukemia of B-cell type not having achieved remission: Secondary | ICD-10-CM

## 2019-06-08 DIAGNOSIS — D649 Anemia, unspecified: Secondary | ICD-10-CM | POA: Diagnosis not present

## 2019-06-08 LAB — CBC WITH DIFFERENTIAL (CANCER CENTER ONLY)
Abs Immature Granulocytes: 0.02 10*3/uL (ref 0.00–0.07)
Basophils Absolute: 0.1 10*3/uL (ref 0.0–0.1)
Basophils Relative: 0 %
Eosinophils Absolute: 0.1 10*3/uL (ref 0.0–0.5)
Eosinophils Relative: 1 %
HCT: 35.9 % — ABNORMAL LOW (ref 36.0–46.0)
Hemoglobin: 11.7 g/dL — ABNORMAL LOW (ref 12.0–15.0)
Immature Granulocytes: 0 %
Lymphocytes Relative: 75 %
Lymphs Abs: 17 10*3/uL — ABNORMAL HIGH (ref 0.7–4.0)
MCH: 34 pg (ref 26.0–34.0)
MCHC: 32.6 g/dL (ref 30.0–36.0)
MCV: 104.4 fL — ABNORMAL HIGH (ref 80.0–100.0)
Monocytes Absolute: 2.5 10*3/uL — ABNORMAL HIGH (ref 0.1–1.0)
Monocytes Relative: 11 %
Neutro Abs: 3 10*3/uL (ref 1.7–7.7)
Neutrophils Relative %: 13 %
Platelet Count: 154 10*3/uL (ref 150–400)
RBC: 3.44 MIL/uL — ABNORMAL LOW (ref 3.87–5.11)
RDW: 14.1 % (ref 11.5–15.5)
WBC Count: 22.8 10*3/uL — ABNORMAL HIGH (ref 4.0–10.5)
nRBC: 0 % (ref 0.0–0.2)

## 2019-06-08 LAB — CMP (CANCER CENTER ONLY)
ALT: 29 U/L (ref 0–44)
AST: 27 U/L (ref 15–41)
Albumin: 3.9 g/dL (ref 3.5–5.0)
Alkaline Phosphatase: 95 U/L (ref 38–126)
Anion gap: 10 (ref 5–15)
BUN: 27 mg/dL — ABNORMAL HIGH (ref 8–23)
CO2: 26 mmol/L (ref 22–32)
Calcium: 10 mg/dL (ref 8.9–10.3)
Chloride: 102 mmol/L (ref 98–111)
Creatinine: 1 mg/dL (ref 0.44–1.00)
GFR, Est AFR Am: 56 mL/min — ABNORMAL LOW (ref >60)
GFR, Estimated: 48 mL/min — ABNORMAL LOW (ref >60)
Glucose, Bld: 124 mg/dL — ABNORMAL HIGH (ref 70–99)
Potassium: 4.2 mmol/L (ref 3.5–5.1)
Sodium: 138 mmol/L (ref 135–145)
Total Bilirubin: 0.4 mg/dL (ref 0.3–1.2)
Total Protein: 6.7 g/dL (ref 6.5–8.1)

## 2019-06-08 LAB — LACTATE DEHYDROGENASE: LDH: 233 U/L — ABNORMAL HIGH (ref 98–192)

## 2019-06-08 NOTE — Telephone Encounter (Signed)
Scheduled per los. Gave avs and calendar  

## 2019-06-08 NOTE — Progress Notes (Signed)
HEMATOLOGY/ONCOLOGY CLINIC NOTE  Date of Service: 06/08/2019  Patient Care Team: Shon Baton, MD as PCP - General (Internal Medicine) Shon Baton, MD (Internal Medicine) Shon Baton, MD (Internal Medicine) Izora Gala, MD as Consulting Physician (Otolaryngology) Eppie Gibson, MD as Attending Physician (Radiation Oncology) Brunetta Genera, MD as Consulting Physician (Hematology)  CHIEF COMPLAINTS/PURPOSE OF CONSULTATION:  Recently diagnosed Small Lymphocytic Lymphoma  HISTORY OF PRESENTING ILLNESS:   Mary Kim is a wonderful 86 y.o. female who has been referred to [place] by Dr Isidore Moos for evaluation and management of small lymphocytic lymphoma. Pt is accompanied today by her daughter. The pt reports that she is doing well overall.  The pt reports that she was washing her face about 3 months ago and noticed a lump under her chin, near her neck. She spoke to her PCP, Dr. Virgina Jock, who thought that it was a stone and referred pt to Dr. Constance Holster, ENT. Dr. Constance Holster then ordered a neck CT and a lymph node biopsy.    Pt denies feeing any different in the last 6 months to a year. She reports that she has been healthy in general and her other conditions are stable at this time. She has already had one dose of COVID19 vaccine and will receive the second in February. Pt has had her annual flu vaccine but is unsure of the last time she had her pneumonia vaccines. Pt would not like to begin treatment if it causes symptoms, as she is not currently burdened by symptoms from the disease.   Of note prior to the patient's visit today, pt has had Flow Pathology ([id number]) completed on 01/10/2019 with results revealing "- Monoclonal B-cell population identified, see comment."  Pt has had Surgical Pathology Report ([id number]) completed on 01/10/2019 with results revealing "LYMPH NODE, LEFT CERVICAL, EXCISION: - Chronic lymphocytic leukemia/small lymphocytic lymphoma."   Pt has had CT Soft Tissue Neck  ([id number]) completed on 12/08/2018 with results revealing "Cervical lymphadenopathy at both the upper and lower nodal stations bilaterally. An index left level 2 lymph node measures 2.4 x 2.0 cm, likely accounting for the palpable submandibular abnormality. Findings are highly suspicious for a lymphoproliferative process or metastatic nodal disease."  Most recent lab results (07/01/2017) of CBC is as follows: all values are WNL except for WBC at 11.4K, Neutro Rel at 41.5, Lymphs Rel at 54.3, Lymphs Abs at 6.2K.  On review of systems, pt denies fevers, chills, night sweats, unexpected weight loss, loss of appetite, back pain, abdominal pain, leg swelling and any other symptoms.   On PMHx the pt reports HLD, GERD, HTN, Osteoporosis.  INTERVAL HISTORY: Mary Kim is a wonderful 86 y.o. female who is here for evaluation and management of small lymphocytic lymphoma. We are joined today by her daughter, Mary Kim. The patient's last visit with [place] was on 02/08/2019. The pt reports that she is doing well overall.  The pt reports that she has felt stable over the last four months and denies any new concerns. She has been having difficulty trying to eat the food at her senior home due to the poor food quality. Pt has been having some itching under her breast that is most noticeable when undressing. Pt has had both doses of the COVID19 vaccine.   Lab results today (06/08/19) of CBC w/diff and CMP is as follows: all values are WNL except for WBC at 22.8K, RBC at 3.44, Hgb at 11.7, HCT at 35.9, MCV at 104.4, Glucose at 124, BUN at 27,  GFR Est Non Af Am at 48. 06/08/2019 LDH at 233  On review of systems, pt reports itching and denies fatigue, fevers, chills, unexpected weight loss, night sweats, rashes, leg swelling, abdominal pain and any other symptoms.   MEDICAL HISTORY:  Past Medical History:  Diagnosis Date  . Chronic cough   . Elevated cholesterol   . GERD (gastroesophageal reflux disease)   .  Hearing loss    left hearing aid  . Hyperlipidemia   . Hypertension   . Lymphoma ([place])   . Osteoporosis     SURGICAL HISTORY: Past Surgical History:  Procedure Laterality Date  . CATARACT EXTRACTION  2011  . LYMPH NODE BIOPSY Left 01/10/2019   Procedure: EXCISIONAL BIOPSY OF CERVICAL LYMPH NODE/LEFT;  Surgeon: Izora Gala, MD;  Location: [facility];  Service: ENT;  Laterality: Left;    SOCIAL HISTORY: Social History   Socioeconomic History  . Marital status: Widowed    Spouse name: Not on file  . Number of children: Not on file  . Years of education: Not on file  . Highest education level: Not on file  Occupational History  . Not on file  Tobacco Use  . Smoking status: Never Smoker  . Smokeless tobacco: Never Used  Substance and Sexual Activity  . Alcohol use: No  . Drug use: No  . Sexual activity: Never  Other Topics Concern  . Not on file  Social History Narrative   ** Merged History Encounter    Pt states recent bronchial diagnosis from Dr Melvyn Novas but unable to recall specific        Social Determinants of Health   Financial Resource Strain:   . Difficulty of Paying Living Expenses:   Food Insecurity:   . Worried About Charity fundraiser in the Last Year:   . Arboriculturist in the Last Year:   Transportation Needs:   . Film/video editor (Medical):   Marland Kitchen Lack of Transportation (Non-Medical):   Physical Activity:   . Days of Exercise per Week:   . Minutes of Exercise per Session:   Stress:   . Feeling of Stress :   Social Connections:   . Frequency of Communication with Friends and Family:   . Frequency of Social Gatherings with Friends and Family:   . Attends Religious Services:   . Active Member of Clubs or Organizations:   . Attends Archivist Meetings:   Marland Kitchen Marital Status:   Intimate Partner Violence:   . Fear of Current or Ex-Partner:   . Emotionally Abused:   Marland Kitchen Physically Abused:   . Sexually Abused:     FAMILY  HISTORY: No family history on file.  ALLERGIES:  has No Known Allergies.  MEDICATIONS:  Current Outpatient Medications  Medication Sig Dispense Refill  . amLODipine (NORVASC) 10 MG tablet Take 10 mg by mouth daily.    Marland Kitchen aspirin 81 MG tablet Take 81 mg by mouth daily.    Marland Kitchen atorvastatin (LIPITOR) 20 MG tablet Take 20 mg by mouth daily.    . Calcium Carbonate-Vitamin D (CALTRATE 600+D) 600-400 MG-UNIT per tablet Take 1 tablet by mouth daily.    . cholecalciferol (VITAMIN D) 1000 UNITS tablet Take 1,000 Units by mouth daily.    Marland Kitchen denosumab (PROLIA) 60 MG/ML SOLN injection Inject 60 mg into the skin every 6 (six) months. Given in May and Nov. - Administer in upper arm, thigh, or abdomen    . fluticasone (FLONASE) 50 MCG/ACT  nasal spray Place 2 sprays into both nostrils 2 (two) times daily. 16 g 3  . latanoprost (XALATAN) 0.005 % ophthalmic solution 1 drop at bedtime.    . Multiple Vitamin (MULTIVITAMIN WITH MINERALS) TABS Take 1 tablet by mouth daily.    . Multiple Vitamins-Minerals (PRESERVISION AREDS 2) CAPS Take by mouth.    Marland Kitchen omeprazole (PRILOSEC) 20 MG capsule Take 1 capsule (20 mg total) by mouth daily. 30 capsule 4  . OVER THE COUNTER MEDICATION Take 1 tablet by mouth daily. Called mucus relief, generic of mucinex dm    . Respiratory Therapy Supplies (FLUTTER) DEVI Use as directed 1 each 0  . sodium chloride HYPERTONIC 3 % nebulizer solution Take two times daily prior to Flutter Valve use 750 mL 6   No current facility-administered medications for this visit.    REVIEW OF SYSTEMS:   A 10+ POINT REVIEW OF SYSTEMS WAS OBTAINED including neurology, dermatology, psychiatry, cardiac, respiratory, lymph, extremities, GI, GU, Musculoskeletal, constitutional, breasts, reproductive, HEENT.  All pertinent positives are noted in the HPI.  All others are negative.   PHYSICAL EXAMINATION: ECOG PERFORMANCE STATUS: 0 - Asymptomatic  . Vitals:   06/08/19 1345  BP: (!) 159/78  Pulse: 84  Resp:  15  Temp: 98.5 F (36.9 C)  SpO2: 98%   Filed Weights   06/08/19 1345  Weight: 106 lb 14.4 oz (48.5 kg)   .Body mass index is 17.79 kg/m.  Exam was given in a chair   GENERAL:alert, in no acute distress and comfortable SKIN: no acute rashes, no significant lesions EYES: conjunctiva are pink and non-injected, sclera anicteric OROPHARYNX: MMM, no exudates, no oropharyngeal erythema or ulceration NECK: supple, no JVD LYMPH:  no palpable lymphadenopathy in the cervical, axillary or inguinal regions LUNGS: clear to auscultation b/l with normal respiratory effort HEART: regular rate & rhythm ABDOMEN:  normoactive bowel sounds , non tender, not distended. No palpable hepatosplenomegaly.  Extremity: no pedal edema PSYCH: alert & oriented x 3 with fluent speech NEURO: no focal motor/sensory deficits  LABORATORY DATA:  I have reviewed the data as listed  . CBC Latest Ref Rng & Units 06/08/2019 05/05/2019 02/08/2019  WBC 4.0 - 10.5 K/uL 22.8(H) 13.7(H) 13.6(H)  Hemoglobin 12.0 - 15.0 g/dL 11.7(L) 11.5(L) 11.1(L)  Hematocrit 36.0 - 46.0 % 35.9(L) 34.8(L) 34.5(L)  Platelets 150 - 400 K/uL 154 144(L) 141(L)    . CMP Latest Ref Rng & Units 06/08/2019 02/08/2019 12/15/2011  Glucose 70 - 99 mg/dL 124(H) 93 100(H)  BUN 8 - 23 mg/dL 27(H) 26(H) 19  Creatinine 0.44 - 1.00 mg/dL 1.00 0.90 1.02  Sodium 135 - 145 mmol/L 138 138 139  Potassium 3.5 - 5.1 mmol/L 4.2 4.3 4.4  Chloride 98 - 111 mmol/L 102 104 103  CO2 22 - 32 mmol/L 26 25 28   Calcium 8.9 - 10.3 mg/dL 10.0 9.1 9.4  Total Protein 6.5 - 8.1 g/dL 6.7 6.6 6.5  Total Bilirubin 0.3 - 1.2 mg/dL 0.4 0.3 0.4  Alkaline Phos 38 - 126 U/L 95 100 59  AST 15 - 41 U/L 27 31 23   ALT 0 - 44 U/L 29 31 22    01/10/2019 Surgical Pathology Report ([id number]):   01/10/2019 Flow Pathology Report ([id number]):   RADIOGRAPHIC STUDIES: I have personally reviewed the radiological images as listed and agreed with the findings in the report. No  results found.  ASSESSMENT & PLAN:   86 yo with  t 1) Recenly diagnosed CLL/SLL -12/08/2018 CT Soft Tissue Neck ([id number])  revealed "Cervical lymphadenopathy at both the upper and lower nodal stations bilaterally. An index left level 2 lymph node measures 2.4 x 2.0 cm, likely accounting for the palpable submandibular abnormality. Findings are highly suspicious for a lymphoproliferative process or metastatic nodal disease." -01/10/2019 Surgical Pathology Report ([id number]) revealed "Chronic lymphocytic leukemia/small lymphocytic lymphoma."  -01/10/2019 Flow Pathology ([id number]) revealed "Monoclonal B-cell population identified" PLAN: -Discussed pt labwork today, 06/08/19; WBC have gradually increased, minimal anemia, PLT have normalized, blood chemistries are stable, LDH is slightly elevated -Pt has no constitutional symptoms, no splenomegaly, and no lymphadenopathy on exam -No lab or clinical evidence to suggest significant CLL/SLL progression at this time requiring treatment. -No indication to begin treatment at this time. Will continue watchful observation -Discussed venous stasis changes in legs -Advised pt that itching under breast could be due to a fungal infection. Recommend pt wear lose-fitting clothing.  -FISH/CLL Prognostic Panel was sent out today- will f/u as appropriate -Will see back in 6 months with labs    FOLLOW UP: RTC with Dr Irene Limbo with labs in 6 months   The total time spent in the appt was 20 minutes and more than 50% was on counseling and direct patient cares.  All of the patient's questions were answered with apparent satisfaction. The patient knows to call the clinic with any problems, questions or concerns.    Sullivan Lone MD [place] AAHIVMS [facility] Hematology/Oncology Physician [facility]  (Office):       [phone] (Work cell):  [phone] (Fax):           [phone]  06/08/2019 8:10 AM  I, Yevette Edwards, am acting as a  scribe for Dr. Sullivan Lone.   .I have reviewed the above documentation for accuracy and completeness, and I agree with the above. Brunetta Genera MD

## 2019-06-09 ENCOUNTER — Ambulatory Visit (HOSPITAL_COMMUNITY)
Admission: RE | Admit: 2019-06-09 | Discharge: 2019-06-09 | Disposition: A | Discharge status: Home / Self Care | Payer: Medicare Other | Source: Ambulatory Visit | Attending: Internal Medicine | Admitting: Internal Medicine

## 2019-06-09 DIAGNOSIS — M810 Age-related osteoporosis without current pathological fracture: Secondary | ICD-10-CM | POA: Diagnosis not present

## 2019-06-09 MED ORDER — DENOSUMAB 60 MG/ML ~~LOC~~ SOSY
PREFILLED_SYRINGE | SUBCUTANEOUS | Status: AC
  Filled 2019-06-09: qty 1

## 2019-06-09 MED ORDER — DENOSUMAB 60 MG/ML ~~LOC~~ SOSY
60.0000 mg | PREFILLED_SYRINGE | Freq: Once | SUBCUTANEOUS | Status: AC
  Administered 2019-06-09: 60 mg via SUBCUTANEOUS

## 2019-06-23 ENCOUNTER — Encounter (HOSPITAL_COMMUNITY): Payer: Self-pay

## 2019-06-23 ENCOUNTER — Emergency Department (HOSPITAL_COMMUNITY): Payer: Medicare Other

## 2019-06-23 ENCOUNTER — Other Ambulatory Visit: Payer: Self-pay

## 2019-06-23 ENCOUNTER — Ambulatory Visit (INDEPENDENT_AMBULATORY_CARE_PROVIDER_SITE_OTHER)
Admission: EM | Admit: 2019-06-23 | Discharge: 2019-06-23 | Disposition: A | Discharge status: Home / Self Care | Payer: Medicare Other | Source: Home / Self Care

## 2019-06-23 ENCOUNTER — Emergency Department (HOSPITAL_COMMUNITY)
Admission: EM | Admit: 2019-06-23 | Discharge: 2019-06-24 | Disposition: A | Discharge status: Home / Self Care | Payer: Medicare Other | Attending: Emergency Medicine | Admitting: Emergency Medicine

## 2019-06-23 DIAGNOSIS — I10 Essential (primary) hypertension: Secondary | ICD-10-CM | POA: Insufficient documentation

## 2019-06-23 DIAGNOSIS — R519 Headache, unspecified: Secondary | ICD-10-CM

## 2019-06-23 DIAGNOSIS — G4489 Other headache syndrome: Secondary | ICD-10-CM | POA: Insufficient documentation

## 2019-06-23 DIAGNOSIS — Z7982 Long term (current) use of aspirin: Secondary | ICD-10-CM | POA: Insufficient documentation

## 2019-06-23 DIAGNOSIS — R791 Abnormal coagulation profile: Secondary | ICD-10-CM | POA: Insufficient documentation

## 2019-06-23 DIAGNOSIS — Z79899 Other long term (current) drug therapy: Secondary | ICD-10-CM | POA: Insufficient documentation

## 2019-06-23 DIAGNOSIS — R030 Elevated blood-pressure reading, without diagnosis of hypertension: Secondary | ICD-10-CM

## 2019-06-23 LAB — CBC
HCT: 36.3 % (ref 36.0–46.0)
Hemoglobin: 12.2 g/dL (ref 12.0–15.0)
MCH: 34.5 pg — ABNORMAL HIGH (ref 26.0–34.0)
MCHC: 33.6 g/dL (ref 30.0–36.0)
MCV: 102.5 fL — ABNORMAL HIGH (ref 80.0–100.0)
Platelets: 143 10*3/uL — ABNORMAL LOW (ref 150–400)
RBC: 3.54 MIL/uL — ABNORMAL LOW (ref 3.87–5.11)
RDW: 13.7 % (ref 11.5–15.5)
WBC: 22.3 10*3/uL — ABNORMAL HIGH (ref 4.0–10.5)
nRBC: 0 % (ref 0.0–0.2)

## 2019-06-23 LAB — I-STAT CHEM 8, ED
BUN: 25 mg/dL — ABNORMAL HIGH (ref 8–23)
Calcium, Ion: 1.17 mmol/L (ref 1.15–1.40)
Chloride: 104 mmol/L (ref 98–111)
Creatinine, Ser: 0.9 mg/dL (ref 0.44–1.00)
Glucose, Bld: 102 mg/dL — ABNORMAL HIGH (ref 70–99)
HCT: 37 % (ref 36.0–46.0)
Hemoglobin: 12.6 g/dL (ref 12.0–15.0)
Potassium: 3.8 mmol/L (ref 3.5–5.1)
Sodium: 137 mmol/L (ref 135–145)
TCO2: 23 mmol/L (ref 22–32)

## 2019-06-23 LAB — DIFFERENTIAL
Abs Immature Granulocytes: 0 10*3/uL (ref 0.00–0.07)
Basophils Absolute: 0.4 10*3/uL — ABNORMAL HIGH (ref 0.0–0.1)
Basophils Relative: 2 %
Eosinophils Absolute: 0.4 10*3/uL (ref 0.0–0.5)
Eosinophils Relative: 2 %
Lymphocytes Relative: 69 %
Lymphs Abs: 15.4 10*3/uL — ABNORMAL HIGH (ref 0.7–4.0)
Monocytes Absolute: 0 10*3/uL — ABNORMAL LOW (ref 0.1–1.0)
Monocytes Relative: 0 %
Neutro Abs: 6 10*3/uL (ref 1.7–7.7)
Neutrophils Relative %: 27 %
nRBC: 0 /100 WBC

## 2019-06-23 LAB — COMPREHENSIVE METABOLIC PANEL
ALT: 28 U/L (ref 0–44)
AST: 30 U/L (ref 15–41)
Albumin: 4.2 g/dL (ref 3.5–5.0)
Alkaline Phosphatase: 82 U/L (ref 38–126)
Anion gap: 10 (ref 5–15)
BUN: 26 mg/dL — ABNORMAL HIGH (ref 8–23)
CO2: 24 mmol/L (ref 22–32)
Calcium: 9.6 mg/dL (ref 8.9–10.3)
Chloride: 102 mmol/L (ref 98–111)
Creatinine, Ser: 1.02 mg/dL — ABNORMAL HIGH (ref 0.44–1.00)
GFR calc Af Amer: 55 mL/min — ABNORMAL LOW (ref >60)
GFR calc non Af Amer: 47 mL/min — ABNORMAL LOW (ref >60)
Glucose, Bld: 107 mg/dL — ABNORMAL HIGH (ref 70–99)
Potassium: 3.9 mmol/L (ref 3.5–5.1)
Sodium: 136 mmol/L (ref 135–145)
Total Bilirubin: 0.8 mg/dL (ref 0.3–1.2)
Total Protein: 6.9 g/dL (ref 6.5–8.1)

## 2019-06-23 LAB — PROTIME-INR
INR: 1.1 (ref 0.8–1.2)
Prothrombin Time: 13.8 seconds (ref 11.4–15.2)

## 2019-06-23 LAB — APTT: aPTT: 29 seconds (ref 24–36)

## 2019-06-23 MED ORDER — SODIUM CHLORIDE 0.9% FLUSH
3.0000 mL | Freq: Once | INTRAVENOUS | Status: DC
Start: 2019-06-23 — End: 2019-06-24

## 2019-06-23 NOTE — Discharge Instructions (Addendum)
I am recommending that you report to the emergency room now for a head CT scan to rule out an intracranial process such as a stroke especially given your elevated blood pressure reading and the fact that Tylenol has not helped.

## 2019-06-23 NOTE — ED Triage Notes (Signed)
Pt here from Urgent Care for a CT head to evaluate headache since last night that is intermittent and feels like "lightning bolts." Taking tylenol without relief.

## 2019-06-23 NOTE — ED Provider Notes (Signed)
[facility]   MRN: [id number] DOB: 26-Apr-1923  Subjective:   Mary Kim is a 86 y.o. female presenting for acute onset of a severe right-sided throbbing headache that started last night and has not let the patient rest or sleep.  She has used Tylenol multiple times and is not getting any kind of relief.  States that she generally does not get headaches and is concerned about having a stroke.  She is compliant with her medication regimen, takes amlodipine once daily.  Denies vision changes, new confusion, chest pain, nausea, vomiting, belly pain, lower leg swelling.  No current facility-administered medications for this encounter.  Current Outpatient Medications:  .  amLODipine (NORVASC) 10 MG tablet, Take 10 mg by mouth daily., Disp: , Rfl:  .  aspirin 81 MG tablet, Take 81 mg by mouth daily., Disp: , Rfl:  .  atorvastatin (LIPITOR) 20 MG tablet, Take 20 mg by mouth daily., Disp: , Rfl:  .  Calcium Carbonate-Vitamin D (CALTRATE 600+D) 600-400 MG-UNIT per tablet, Take 1 tablet by mouth daily., Disp: , Rfl:  .  cholecalciferol (VITAMIN D) 1000 UNITS tablet, Take 1,000 Units by mouth daily., Disp: , Rfl:  .  denosumab (PROLIA) 60 MG/ML SOLN injection, Inject 60 mg into the skin every 6 (six) months. Given in May and Nov. - Administer in upper arm, thigh, or abdomen, Disp: , Rfl:  .  fluticasone (FLONASE) 50 MCG/ACT nasal spray, Place 2 sprays into both nostrils 2 (two) times daily., Disp: 16 g, Rfl: 3 .  latanoprost (XALATAN) 0.005 % ophthalmic solution, 1 drop at bedtime., Disp: , Rfl:  .  Multiple Vitamin (MULTIVITAMIN WITH MINERALS) TABS, Take 1 tablet by mouth daily., Disp: , Rfl:  .  Multiple Vitamins-Minerals (PRESERVISION AREDS 2) CAPS, Take by mouth., Disp: , Rfl:  .  omeprazole (PRILOSEC) 20 MG capsule, Take 1 capsule (20 mg total) by mouth daily., Disp: 30 capsule, Rfl: 4 .  OVER THE COUNTER MEDICATION, Take 1 tablet by mouth daily. Called mucus relief, generic of mucinex  dm, Disp: , Rfl:  .  Respiratory Therapy Supplies (FLUTTER) DEVI, Use as directed, Disp: 1 each, Rfl: 0 .  sodium chloride HYPERTONIC 3 % nebulizer solution, Take two times daily prior to Flutter Valve use, Disp: 750 mL, Rfl: 6   No Known Allergies  Past Medical History:  Diagnosis Date  . Chronic cough   . Elevated cholesterol   . GERD (gastroesophageal reflux disease)   . Hearing loss    left hearing aid  . Hyperlipidemia   . Hypertension   . Lymphoma ([place])   . Osteoporosis      Past Surgical History:  Procedure Laterality Date  . CATARACT EXTRACTION  2011  . LYMPH NODE BIOPSY Left 01/10/2019   Procedure: EXCISIONAL BIOPSY OF CERVICAL LYMPH NODE/LEFT;  Surgeon: Izora Gala, MD;  Location: [facility];  Service: ENT;  Laterality: Left;    No family history on file.  Social History   Tobacco Use  . Smoking status: Never Smoker  . Smokeless tobacco: Never Used  Substance Use Topics  . Alcohol use: No  . Drug use: No    ROS   Objective:   Vitals: BP (!) 173/83 (BP Location: Right Arm)   Pulse 90   Temp 98.4 F (36.9 C) (Oral)   Resp 17   Physical Exam Constitutional:      General: She is not in acute distress.    Appearance: Normal appearance. She is well-developed.  She is not ill-appearing, toxic-appearing or diaphoretic.  HENT:     Head: Normocephalic and atraumatic.     Nose: Nose normal.     Mouth/Throat:     Mouth: Mucous membranes are moist.  Eyes:     Extraocular Movements: Extraocular movements intact.     Pupils: Pupils are equal, round, and reactive to light.  Cardiovascular:     Rate and Rhythm: Normal rate and regular rhythm.     Pulses: Normal pulses.     Heart sounds: Normal heart sounds. No murmur. No friction rub. No gallop.   Pulmonary:     Effort: Pulmonary effort is normal. No respiratory distress.     Breath sounds: Normal breath sounds. No stridor. No wheezing, rhonchi or rales.  Skin:    General: Skin is warm and  dry.     Findings: No rash.  Neurological:     Mental Status: She is alert and oriented to person, place, and time.     Cranial Nerves: No cranial nerve deficit or facial asymmetry.     Coordination: Romberg sign negative. Coordination normal.     Gait: Gait normal.  Psychiatric:        Mood and Affect: Mood normal. Mood is not anxious.        Behavior: Behavior normal. Behavior is not agitated.        Thought Content: Thought content normal.      Assessment and Plan :   PDMP not reviewed this encounter.  1. Bad headache   2. Essential hypertension   3. Elevated blood pressure reading     Unfortunately I cannot definitively prove to the patient that she does not have a stroke and given her elevated blood pressure, lack of relief of her headache with Tylenol I am recommending that she report to the emergency room for further work-up including a head CT.  Patient is very agreeable to this and I will have her daughter transport her there now. Counseled patient on potential for adverse effects with medications prescribed/recommended today, ER and return-to-clinic precautions discussed, patient verbalized understanding.    Jaynee Eagles, [place] 06/23/19 1649

## 2019-06-23 NOTE — ED Notes (Signed)
Patient is being discharged from the [facility] and sent to the Emergency Department via wheelchair by staff. Per Jaynee Eagles, MD, patient is stable but in need of higher level of care due to headache and elevated blood pressure. Patient is aware and verbalizes understanding of plan of care.  Vitals:   06/23/19 1634  BP: (!) 173/83  Pulse: 90  Resp: 17  Temp: 98.4 F (36.9 C)

## 2019-06-23 NOTE — ED Triage Notes (Addendum)
Pt reports having headache since last nigh.  "Feels like a lightnig hitting" the right side of my head", she jumps every time this happens. Tylenol did not helped with the headache. Pt denies nausea, vomiting, vision changes, chest pain.

## 2019-06-24 MED ORDER — ACETAMINOPHEN 325 MG PO TABS
650.0000 mg | ORAL_TABLET | Freq: Once | ORAL | Status: AC
  Administered 2019-06-24: 650 mg via ORAL
  Filled 2019-06-24: qty 2

## 2019-06-24 NOTE — Discharge Instructions (Signed)
You can take tylenol 500mg  or 650mg  every 4-6 hours as needed for headache  You are having a headache. No specific cause was found today for your headache. It may have been a migraine or other cause of headache. Stress, anxiety, fatigue, and depression are common triggers for headaches. Your headache today does not appear to be life-threatening or require hospitalization, but often the exact cause of headaches is not determined in the emergency department. Therefore, follow-up with your doctor is very important to find out what may have caused your headache, and whether or not you need any further diagnostic testing or treatment. Sometimes headaches can appear benign (not harmful), but then more serious symptoms can develop which should prompt an immediate re-evaluation by your doctor or the emergency department.  SEEK MEDICAL ATTENTION IF:  You develop possible problems with medications prescribed.  The medications don't resolve your headache, if it recurs , or if you have multiple episodes of vomiting or can't take fluids. You have a change from the usual headache.  RETURN IMMEDIATELY IF you develop a sudden, severe headache or confusion, become poorly responsive or faint, develop a fever above 100.33F or problem breathing, have a change in speech, vision, swallowing, or understanding, or develop new weakness, numbness, tingling, incoordination, or have a seizure.

## 2019-06-24 NOTE — ED Provider Notes (Signed)
[facility] EMERGENCY DEPARTMENT Provider Note   CSN: [id number] Arrival date & time: 06/23/19  1700     History Chief Complaint  Patient presents with  . Headache    Mary Kim is a 86 y.o. female.  The history is provided by the patient and a relative.  Headache Pain location:  R temporal Quality: throbbing. Radiates to:  Does not radiate Duration:  1 day Timing:  Intermittent Progression:  Improving Chronicity:  New Relieved by:  Nothing Worsened by:  Nothing Associated symptoms: no blurred vision, no dizziness, no eye pain, no fever, no focal weakness, no hearing loss, no loss of balance, no nausea, no neck pain, no numbness, no visual change, no vomiting and no weakness   Patient with history of GERD, CLL, hypertension, hyperlipidemia presents with headache.  This started over 24 hours ago.  She reports she noticed it on the right side of her head.  No trauma.  No new weakness.  No visual changes.  No hearing changes.  She has tried Tylenol with minimal relief.  It did disturb her sleep.  She went to an urgent care and was sent here for further evaluation.  She is otherwise been at her baseline.  She does live in a nursing facility but is very independent and walks around without difficulty     Past Medical History:  Diagnosis Date  . Chronic cough   . Elevated cholesterol   . GERD (gastroesophageal reflux disease)   . Hearing loss    left hearing aid  . Hyperlipidemia   . Hypertension   . Lymphoma ([place])   . Osteoporosis     Patient Active Problem List   Diagnosis Date Noted  . CLL (chronic lymphocytic leukemia) ([place]) 02/02/2019  . Dizziness 03/23/2018  . Allergic rhinitis 11/10/2017  . Eustachian tube dysfunction, unspecified laterality 11/10/2017  . Obstructive bronchiectasis ([place]) 07/08/2017  . Chronic cough 07/01/2017    Past Surgical History:  Procedure Laterality Date  . CATARACT EXTRACTION  2011  . LYMPH NODE BIOPSY Left  01/10/2019   Procedure: EXCISIONAL BIOPSY OF CERVICAL LYMPH NODE/LEFT;  Surgeon: Izora Gala, MD;  Location: [facility];  Service: ENT;  Laterality: Left;     OB History    Gravida  0   Para  0   Term  0   Preterm  0   AB  0   Living        SAB  0   TAB  0   Ectopic  0   Multiple      Live Births              No family history on file.  Social History   Tobacco Use  . Smoking status: Never Smoker  . Smokeless tobacco: Never Used  Substance Use Topics  . Alcohol use: No  . Drug use: No    Home Medications Prior to Admission medications   Medication Sig Start Date End Date Taking? Authorizing Provider  amLODipine (NORVASC) 10 MG tablet Take 10 mg by mouth daily.   Yes [provider]  aspirin 81 MG tablet Take 81 mg by mouth daily.   Yes [provider]  atorvastatin (LIPITOR) 20 MG tablet Take 20 mg by mouth daily.   Yes [provider]  Calcium Carbonate-Vitamin D (CALTRATE 600+D) 600-400 MG-UNIT per tablet Take 1 tablet by mouth daily.   Yes [provider]  cholecalciferol (VITAMIN D) 1000 UNITS tablet Take  1,000 Units by mouth daily.   Yes [provider]  denosumab (PROLIA) 60 MG/ML SOLN injection Inject 60 mg into the skin every 6 (six) months. Given in May and Nov. - Administer in upper arm, thigh, or abdomen   Yes [provider]  fluticasone (FLONASE) 50 MCG/ACT nasal spray Place 2 sprays into both nostrils 2 (two) times daily. 11/10/17  Yes Lauraine Rinne, NP  latanoprost (XALATAN) 0.005 % ophthalmic solution Place 1 drop into both eyes at bedtime.  11/10/18  Yes [provider]  Multiple Vitamin (MULTIVITAMIN WITH MINERALS) TABS Take 1 tablet by mouth daily.   Yes [provider]  Multiple Vitamins-Minerals (PRESERVISION AREDS 2) CAPS Take 1 capsule by mouth daily.    Yes [provider]  omeprazole (PRILOSEC) 20 MG capsule Take 1 capsule (20 mg total) by  mouth daily. 11/10/17  Yes Lauraine Rinne, NP  OVER THE COUNTER MEDICATION Take 1 tablet by mouth daily. Called mucus relief, generic of mucinex dm   Yes [provider]  sodium chloride HYPERTONIC 3 % nebulizer solution Take two times daily prior to Flutter Valve use Patient taking differently: Take 4 mLs by nebulization in the morning and at bedtime. prior to Flutter Valve use 04/07/19  Yes Tanda Rockers, MD  Respiratory Therapy Supplies (FLUTTER) DEVI Use as directed 07/01/17   Tanda Rockers, MD    Allergies    Patient has no known allergies.  Review of Systems   Review of Systems  Constitutional: Negative for fever.  HENT: Negative for hearing loss and tinnitus.   Eyes: Negative for blurred vision, pain, redness and visual disturbance.  Respiratory: Negative for shortness of breath.   Cardiovascular: Negative for chest pain.  Gastrointestinal: Negative for nausea and vomiting.  Musculoskeletal: Negative for neck pain.  Skin: Negative for rash.  Neurological: Positive for headaches. Negative for dizziness, focal weakness, weakness, numbness and loss of balance.  All other systems reviewed and are negative.   Physical Exam Updated Vital Signs BP (!) 175/87 (BP Location: Right Arm)   Pulse 85   Temp 98.6 F (37 C) (Oral)   Resp 15   SpO2 98%   Physical Exam CONSTITUTIONAL: Elderly, no acute distress, appears younger than stated age HEAD: Normocephalic/atraumatic there is no rash noted to her forehead or scalp EYES: EOMI/PERRL, no nystagmus, no ptosis, no corneal hazing, no conjunctival injection ENMT: Mucous membranes moist NECK: supple no meningeal signs, no bruits SPINE/BACK: Kyphotic spine CV: S1/S2 noted, no murmurs/rubs/gallops noted LUNGS: Lungs are clear to auscultation bilaterally, no apparent distress ABDOMEN: soft, nontender, no rebound or guarding GU:no cva tenderness NEURO:Awake/alert, face symmetric, no arm or leg drift is noted Equal 5/5 strength  with shoulder abduction, elbow flex/extension, wrist flex/extension in upper extremities and equal hand grips bilaterally Equal 5/5 strength with hip flexion,knee flex/extension, foot dorsi/plantar flexion Cranial nerves 3/4/5/6/08/04/08/11/12 tested and intact Gait normal without ataxia No past pointing Sensation to light touch intact in all extremities EXTREMITIES: pulses normal, full ROM SKIN: warm, color normal PSYCH: no abnormalities of mood noted, alert and oriented to situation   ED Results / Procedures / Treatments   Labs (all labs ordered are listed, but only abnormal results are displayed) Labs Reviewed  CBC - Abnormal; Notable for the following components:      Result Value   WBC 22.3 (*)    RBC 3.54 (*)    MCV 102.5 (*)    MCH 34.5 (*)    Platelets 143 (*)  All other components within normal limits  DIFFERENTIAL - Abnormal; Notable for the following components:   Lymphs Abs 15.4 (*)    Monocytes Absolute 0.0 (*)    Basophils Absolute 0.4 (*)    All other components within normal limits  COMPREHENSIVE METABOLIC PANEL - Abnormal; Notable for the following components:   Glucose, Bld 107 (*)    BUN 26 (*)    Creatinine, Ser 1.02 (*)    GFR calc non Af Amer 47 (*)    GFR calc Af Amer 55 (*)    All other components within normal limits  I-STAT CHEM 8, ED - Abnormal; Notable for the following components:   BUN 25 (*)    Glucose, Bld 102 (*)    All other components within normal limits  PROTIME-INR  APTT    EKG EKG Interpretation  Date/Time:  Thursday Jun 23 2019 17:52:08 EDT Ventricular Rate:  88 PR Interval:  170 QRS Duration: 106 QT Interval:  372 QTC Calculation: 450 R Axis:   17 Text Interpretation: Normal sinus rhythm Incomplete right bundle branch block Anterior infarct , age undetermined Abnormal ECG No significant change since last tracing Confirmed by Ripley Fraise [phone]) on 06/24/2019 12:32:06 AM   Radiology CT HEAD WO CONTRAST  Result  Date: 06/23/2019 CLINICAL DATA:  Headache since yesterday EXAM: CT HEAD WITHOUT CONTRAST TECHNIQUE: Contiguous axial images were obtained from the base of the skull through the vertex without intravenous contrast. COMPARISON:  07/07/2018 FINDINGS: Brain: No acute infarct or hemorrhage. Lateral ventricles and midline structures are stable. No acute extra-axial fluid collections. No mass effect. Vascular: Extensive atherosclerosis of the carotid and vertebral arteries bilaterally. No hyperdense vessel. Skull: Normal. Negative for fracture or focal lesion. Sinuses/Orbits: No acute finding. Other: None. IMPRESSION: 1. Stable head CT, no acute process. Electronically Signed   By: Randa Ngo M.D.   On: 06/23/2019 20:00    Procedures Procedures (including critical care time)  Medications Ordered in ED Medications  sodium chloride flush (NS) 0.9 % injection 3 mL (has no administration in time range)  acetaminophen (TYLENOL) tablet 650 mg (650 mg Oral Given 06/24/19 0110)    ED Course  I have reviewed the triage vital signs and the nursing notes.  Pertinent labs & imaging results that were available during my care of the patient were reviewed by me and considered in my medical decision making (see chart for details).    MDM Rules/Calculators/A&P                       This patient presents to the ED for concern of headache, this involves an extensive number of treatment options, and is a complaint that carries with it a high risk of complications and morbidity.  The differential diagnosis includes subarachnoid hemorrhage, meningitis, migraine, glaucoma,, intracranial hemorrhage, stroke, shingles   Lab Tests:   I Ordered, reviewed, and interpreted labs, which included letter lites, complete blood count  Medicines ordered:   I ordered medication: Tylenol for pain  Imaging Studies ordered:   I ordered imaging studies which included CT head   I independently visualized and interpreted  imaging which showed no acute findings  Additional history obtained:   Additional history obtained from daughter  Previous records obtained and reviewed   Patient is a very well-appearing 86 year old presenting with headache.  She is in no acute distress, no focal findings.  She is ambulatory without difficulty.  No focal weakness.  No visual changes to suggest  temporal arteritis or glaucoma.  No rash to suggest zoster.  History not consistent with subarachnoid hemorrhage.  She is afebrile, no meningeal signs, low suspicion for meningitis.  Patient does have leukocytosis, but has previous history of CLL and this appears near baseline Patient would like to be discharged home.  We discussed appropriate use of Tylenol.  She will see her PCP next week if there is no improvement.  We discussed strict return precautions and daughter was present during the history and physical. Final Clinical Impression(s) / ED Diagnoses Final diagnoses:  Other headache syndrome    Rx / DC Orders ED Discharge Orders    None       Ripley Fraise, MD 06/24/19 [phone]

## 2019-06-24 NOTE — ED Notes (Signed)
Patient verbalizes understanding of discharge instructions. Opportunity for questioning and answers were provided. Armband removed by staff, pt discharged from ED stable & ambulatory  

## 2019-06-28 DIAGNOSIS — I708 Atherosclerosis of other arteries: Secondary | ICD-10-CM | POA: Diagnosis not present

## 2019-06-28 DIAGNOSIS — G43C0 Periodic headache syndromes in child or adult, not intractable: Secondary | ICD-10-CM | POA: Diagnosis not present

## 2019-06-28 DIAGNOSIS — C9100 Acute lymphoblastic leukemia not having achieved remission: Secondary | ICD-10-CM | POA: Diagnosis not present

## 2019-06-28 DIAGNOSIS — N1831 Chronic kidney disease, stage 3a: Secondary | ICD-10-CM | POA: Diagnosis not present

## 2019-06-28 DIAGNOSIS — I129 Hypertensive chronic kidney disease with stage 1 through stage 4 chronic kidney disease, or unspecified chronic kidney disease: Secondary | ICD-10-CM | POA: Diagnosis not present

## 2019-07-11 LAB — FISH,CLL PROGNOSTIC PANEL

## 2019-08-30 DIAGNOSIS — Z20822 Contact with and (suspected) exposure to covid-19: Secondary | ICD-10-CM | POA: Diagnosis not present

## 2019-10-11 DIAGNOSIS — M25562 Pain in left knee: Secondary | ICD-10-CM | POA: Diagnosis not present

## 2019-10-11 DIAGNOSIS — M1712 Unilateral primary osteoarthritis, left knee: Secondary | ICD-10-CM | POA: Diagnosis not present

## 2019-10-24 ENCOUNTER — Encounter: Payer: Self-pay | Admitting: Internal Medicine

## 2019-10-24 ENCOUNTER — Ambulatory Visit (INDEPENDENT_AMBULATORY_CARE_PROVIDER_SITE_OTHER): Payer: Medicare Other

## 2019-10-24 ENCOUNTER — Ambulatory Visit (INDEPENDENT_AMBULATORY_CARE_PROVIDER_SITE_OTHER): Payer: Medicare Other | Admitting: Internal Medicine

## 2019-10-24 ENCOUNTER — Other Ambulatory Visit: Payer: Self-pay

## 2019-10-24 DIAGNOSIS — J479 Bronchiectasis, uncomplicated: Secondary | ICD-10-CM

## 2019-10-24 NOTE — Progress Notes (Signed)
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 Chest CT 5/30  started with a head cold before xmas 2018  "just like everybody else" and some better after abx then worse p several weeks p finished abx and about the same since despite additional coursed abx though does not know names  Mucus was mostly clear occ yellow min volume and no change off  abx x several months  Worse at hs then resolves s noct or am distubance or flare  rec Augmentin 875 mg take one pill twice daily  X 10 days - Prednisone 10 mg take  4 each am x 2 days,   2 each am x 2 days,  1 each am x 2 days and stop  For mucinex dm 1200 mg every 12 hours and use the flutter valve as much as you can  Try prilosec otc 20mg   Take 30-60 min before first meal of the day and Pepcid ac (famotidine) 20 mg one @  bedtime until cough is completely gone for at least a week without the need for cough suppression GERD (REFLUX)   If not better when you finish your augmentin you will need a CT sinus > neg 07/20/17   07/21/17 NP Eval/ [name] Spirometry c/w mild obst > symb 80 2bid / zyrtec hs  08/19/2017  f/u ov/Kayelee Herbig re: obst bronchiectasis  Chief Complaint  Patient presents with   Follow-up    Pt states she does not cough much. She only coughs after uses her flutter valve about 3 x per day- prod with clear sputum.    Dyspnea:  Not limited Cough: some hoarseness but cough is much better SABA use: none 02: none   rec No change > did not continue  symbicort or pepcid      12/02/2017  f/u ov/Sherhonda Gaspar re: s/p bronchiectasis flare now off symbicort  And no change on vs off perceived  Chief Complaint  Patient presents with   Follow-up    Cough is much improved. She produces clear sputum.   Dyspnea:  DR and back ok / [employer]  [place] leaning on cart  Cough: no am flare Using flutter  Prn  Sleeping: about 30 degrees with blocks / sleeps fine  SABA use: none 02: none rec If cough / congestion > mucinex max dose is 1200 mg every 12 hours and use the flutter valve as much as possible  GERD diet  Please schedule a follow up visit in 6  months but call sooner if needed     10/22/2018  f/u ov/Haja Crego re: bronchiectasis / symptoms improved since started on 3% saline bid Chief Complaint  Patient presents with   Obstructive Bronchiectasis    Using nebulizer machine has seen improvement.  Dyspnea:  Walking up to 20 min daily  Cough: better / no purulent sputum Sleeping: wedge pillow/ bed is flat  SABA use: none 02: none Salivary stone >  f/u with Constance Holster  rec No change in medications Please schedule a follow up visit in 12  months but call sooner if needed    10/24/2019  f/u ov/Delynn Pursley re: bronchiectasis / stopped 3% x 2 days due to itching x months / vaccinated with Moderna last dose 02/28/19 Chief Complaint  Patient presents with   Follow-up   Dyspnea:  Walking inside [facility] [place] "all day " Cough: none  Sleeping: flat bed wedge pillow no am sputtering  SABA use: no inhalers 02: none    No obvious day to day or daytime variability or assoc excess/ purulent sputum or mucus plugs or hemoptysis or cp or chest tightness, subjective wheeze or overt sinus or hb symptoms.   Sleeping  without nocturnal  or early am exacerbation  of respiratory  c/o's or need for noct saba. Also denies any obvious fluctuation of symptoms with weather or environmental changes or other aggravating or alleviating factors except as outlined above   No unusual  exposure hx or h/o childhood pna/ asthma or knowledge of premature birth.  Current Allergies, Complete Past Medical History, Past Surgical History, Family History, and Social History were reviewed in [employer] record.  ROS  The following are not active complaints unless bolded Hoarseness, sore throat, dysphagia, dental problems, itching, sneezing,  nasal congestion or discharge of excess mucus or purulent secretions, ear ache,   fever, chills, sweats, unintended wt loss or wt gain, classically pleuritic or exertional cp,  orthopnea pnd or arm/hand swelling  or leg swelling, presyncope, palpitations, abdominal pain, anorexia, nausea, vomiting, diarrhea  or change in bowel habits or change in bladder habits, change in stools or change in urine, dysuria, hematuria,  rash, arthralgias, visual complaints, headache, numbness, weakness or ataxia or problems with walking or coordination,  change in mood or  memory.        Current Meds  Medication Sig   amLODipine (NORVASC) 10 MG tablet Take 10 mg by mouth daily.   aspirin 81 MG tablet Take 81 mg by mouth daily.   atorvastatin (LIPITOR) 20 MG tablet Take 20 mg by mouth daily.   Calcium Carbonate-Vitamin D (CALTRATE 600+D) 600-400 MG-UNIT per tablet Take 1 tablet by mouth daily.   cholecalciferol (VITAMIN D) 1000 UNITS tablet Take 1,000 Units by mouth daily.   denosumab (PROLIA) 60 MG/ML SOLN injection Inject 60 mg into the skin every 6 (six) months. Given in May and Nov. - Administer in upper arm, thigh, or abdomen   fluticasone (FLONASE) 50 MCG/ACT nasal spray Place 2 sprays into both nostrils 2 (two) times daily.   latanoprost (XALATAN) 0.005 % ophthalmic solution Place 1 drop into both eyes at bedtime.    Multiple Vitamin (MULTIVITAMIN WITH MINERALS) TABS Take 1 tablet by mouth daily.   Multiple Vitamins-Minerals (PRESERVISION AREDS 2) CAPS Take 1 capsule by mouth daily.    omeprazole (PRILOSEC) 20 MG capsule Take 1  capsule (20 mg total) by mouth daily.   OVER THE COUNTER MEDICATION Take 1 tablet by mouth daily. Called mucus relief, generic of mucinex dm                      Objective:   Physical Exam    Thin pleasant elderly amb wf nad   10/24/2019       106 10/22/2018       106  12/02/2017       114  08/19/2017       113  07/01/17 113 lb 12.8 oz (51.6 kg)  03/17/17 118 lb 9.6 oz (53.8 kg)  09/04/15 124 lb 3.2 oz (56.3 kg)      Vital signs reviewed  10/24/2019  - Note at rest 02 sats  95% on RA    HEENT : pt wearing mask not removed for exam due to covid -19 concerns.    NECK :  without JVD/Nodes/TM/ nl carotid upstrokes bilaterally   LUNGS: no acc muscle use,  Nl contour chest which is clear to A and P bilaterally without cough on insp or exp maneuvers   CV:  RRR  no s3 or murmur or increase in P2, and no edema   ABD:  soft and nontender with nl inspiratory excursion in the supine position. No bruits or organomegaly appreciated, bowel sounds nl  MS:  Nl gait/ ext warm without deformities, calf tenderness, cyanosis or clubbing No obvious joint restrictions   SKIN: warm and dry without lesions    NEURO:  alert, approp, nl sensorium with  no motor or cerebellar deficits apparent.           CXR PA and Lateral:   10/24/2019 :    I personally reviewed images and  mpression as follows:   Mild scoliosis/ non-specific increase in markings c/w bronchiectasis s acute changes   Assessment:

## 2019-10-24 NOTE — Patient Instructions (Signed)
No change in medications   Please schedule a follow up visit in 12  months but call sooner if needed  

## 2019-10-24 NOTE — Assessment & Plan Note (Addendum)
See CT chest 06/26/17 with lingular dz most prominent  - Started symbicort 80 2bid 07/21/17 > off ? When  - Spirometry 08/19/2017  FEV1 1.15 (72%)  Ratio 63  Classic curvature ? After symb?  - added hypertonic saline 03/23/18 and pt d/c'd symbicort > d/c'd hypertonic saline 10/22/19 due to pruritis  Adequate control on present rx, reviewed in detail with pt > no change in rx needed   = flutter valve prn, ok off 3% saline but ok to rechallnge if getting worse as I strongly doubt the itching reported is due to use of 3% saline.   >>> f/u q 12 m, call sooner prn           Each maintenance medication was reviewed in detail including emphasizing most importantly the difference between maintenance and prns and under what circumstances the prns are to be triggered using an action plan format where appropriate.  Total time for H and P, chart review, counseling, and generating customized AVS unique to this office visit / charting = 20 min

## 2019-10-25 ENCOUNTER — Telehealth: Payer: Self-pay

## 2019-10-25 ENCOUNTER — Other Ambulatory Visit: Payer: Self-pay | Admitting: Internal Medicine

## 2019-10-25 DIAGNOSIS — Z1231 Encounter for screening mammogram for malignant neoplasm of breast: Secondary | ICD-10-CM

## 2019-10-25 NOTE — Telephone Encounter (Signed)
Pt was informed of cxr results  and no new recommendation are suggested at this time

## 2019-10-31 DIAGNOSIS — E559 Vitamin D deficiency, unspecified: Secondary | ICD-10-CM | POA: Diagnosis not present

## 2019-10-31 DIAGNOSIS — E785 Hyperlipidemia, unspecified: Secondary | ICD-10-CM | POA: Diagnosis not present

## 2019-11-07 DIAGNOSIS — E46 Unspecified protein-calorie malnutrition: Secondary | ICD-10-CM | POA: Diagnosis not present

## 2019-11-07 DIAGNOSIS — N1831 Chronic kidney disease, stage 3a: Secondary | ICD-10-CM | POA: Diagnosis not present

## 2019-11-07 DIAGNOSIS — D61818 Other pancytopenia: Secondary | ICD-10-CM | POA: Diagnosis not present

## 2019-11-07 DIAGNOSIS — Z0000 Encounter for general adult medical examination without abnormal findings: Secondary | ICD-10-CM | POA: Diagnosis not present

## 2019-11-07 DIAGNOSIS — D692 Other nonthrombocytopenic purpura: Secondary | ICD-10-CM | POA: Diagnosis not present

## 2019-11-07 DIAGNOSIS — I872 Venous insufficiency (chronic) (peripheral): Secondary | ICD-10-CM | POA: Diagnosis not present

## 2019-11-07 DIAGNOSIS — C9110 Chronic lymphocytic leukemia of B-cell type not having achieved remission: Secondary | ICD-10-CM | POA: Diagnosis not present

## 2019-11-07 DIAGNOSIS — R82998 Other abnormal findings in urine: Secondary | ICD-10-CM | POA: Diagnosis not present

## 2019-11-07 DIAGNOSIS — I129 Hypertensive chronic kidney disease with stage 1 through stage 4 chronic kidney disease, or unspecified chronic kidney disease: Secondary | ICD-10-CM | POA: Diagnosis not present

## 2019-11-07 DIAGNOSIS — M810 Age-related osteoporosis without current pathological fracture: Secondary | ICD-10-CM | POA: Diagnosis not present

## 2019-11-07 DIAGNOSIS — J479 Bronchiectasis, uncomplicated: Secondary | ICD-10-CM | POA: Diagnosis not present

## 2019-11-07 DIAGNOSIS — Z23 Encounter for immunization: Secondary | ICD-10-CM | POA: Diagnosis not present

## 2019-11-07 DIAGNOSIS — I708 Atherosclerosis of other arteries: Secondary | ICD-10-CM | POA: Diagnosis not present

## 2019-11-14 ENCOUNTER — Telehealth: Payer: Self-pay | Admitting: Internal Medicine

## 2019-11-14 DIAGNOSIS — J479 Bronchiectasis, uncomplicated: Secondary | ICD-10-CM

## 2019-11-14 DIAGNOSIS — R053 Chronic cough: Secondary | ICD-10-CM

## 2019-11-14 NOTE — Telephone Encounter (Signed)
Attempted to call pt's daughter [name] but unable to reach. Left message for her to return call.

## 2019-11-14 NOTE — Telephone Encounter (Signed)
Attempted to call pt's daughter [name] again but unable to reach. Left message for her to return call.

## 2019-11-14 NOTE — Telephone Encounter (Signed)
[name] daughter is returning phone call. [name] phone number is [phone]. May leave detailed message on voicemail.

## 2019-11-15 MED ORDER — SODIUM CHLORIDE 3 % IN NEBU: INHALATION_SOLUTION | RESPIRATORY_TRACT | 6 refills | Status: AC

## 2019-11-15 NOTE — Telephone Encounter (Signed)
Pt's daughter [name] returning call.  [phone].  Patient in need of medication.

## 2019-11-15 NOTE — Telephone Encounter (Signed)
Spoke with [name]. States that pt needs a refill on Hypertonic neb solution. Rx has been sent in. Nothing further was needed.

## 2019-11-17 DIAGNOSIS — Z1212 Encounter for screening for malignant neoplasm of rectum: Secondary | ICD-10-CM | POA: Diagnosis not present

## 2019-11-24 ENCOUNTER — Ambulatory Visit
Admission: RE | Admit: 2019-11-24 | Discharge: 2019-11-24 | Disposition: A | Discharge status: Home / Self Care | Payer: Medicare Other | Source: Ambulatory Visit | Attending: Internal Medicine | Admitting: Internal Medicine

## 2019-11-24 ENCOUNTER — Other Ambulatory Visit: Payer: Self-pay

## 2019-11-24 DIAGNOSIS — Z1231 Encounter for screening mammogram for malignant neoplasm of breast: Secondary | ICD-10-CM

## 2019-12-09 ENCOUNTER — Inpatient Hospital Stay: Payer: Medicare Other | Attending: Hematology | Admitting: Hematology

## 2019-12-09 ENCOUNTER — Other Ambulatory Visit: Payer: Self-pay

## 2019-12-09 ENCOUNTER — Inpatient Hospital Stay: Payer: Medicare Other

## 2019-12-09 VITALS — BP 157/74 | HR 94 | Temp 98.1°F | Resp 18 | Ht 65.0 in | Wt 107.9 lb

## 2019-12-09 DIAGNOSIS — L299 Pruritus, unspecified: Secondary | ICD-10-CM | POA: Diagnosis not present

## 2019-12-09 DIAGNOSIS — C9110 Chronic lymphocytic leukemia of B-cell type not having achieved remission: Secondary | ICD-10-CM

## 2019-12-09 DIAGNOSIS — L853 Xerosis cutis: Secondary | ICD-10-CM | POA: Diagnosis not present

## 2019-12-09 LAB — CBC WITH DIFFERENTIAL/PLATELET
Abs Immature Granulocytes: 0.02 10*3/uL (ref 0.00–0.07)
Basophils Absolute: 0.1 10*3/uL (ref 0.0–0.1)
Basophils Relative: 0 %
Eosinophils Absolute: 0.1 10*3/uL (ref 0.0–0.5)
Eosinophils Relative: 1 %
HCT: 32.3 % — ABNORMAL LOW (ref 36.0–46.0)
Hemoglobin: 10.8 g/dL — ABNORMAL LOW (ref 12.0–15.0)
Immature Granulocytes: 0 %
Lymphocytes Relative: 72 %
Lymphs Abs: 10.9 10*3/uL — ABNORMAL HIGH (ref 0.7–4.0)
MCH: 35 pg — ABNORMAL HIGH (ref 26.0–34.0)
MCHC: 33.4 g/dL (ref 30.0–36.0)
MCV: 104.5 fL — ABNORMAL HIGH (ref 80.0–100.0)
Monocytes Absolute: 1.9 10*3/uL — ABNORMAL HIGH (ref 0.1–1.0)
Monocytes Relative: 12 %
Neutro Abs: 2.4 10*3/uL (ref 1.7–7.7)
Neutrophils Relative %: 15 %
Platelets: 149 10*3/uL — ABNORMAL LOW (ref 150–400)
RBC: 3.09 MIL/uL — ABNORMAL LOW (ref 3.87–5.11)
RDW: 13.6 % (ref 11.5–15.5)
WBC: 15.3 10*3/uL — ABNORMAL HIGH (ref 4.0–10.5)
nRBC: 0 % (ref 0.0–0.2)

## 2019-12-09 LAB — CMP (CANCER CENTER ONLY)
ALT: 19 U/L (ref 0–44)
AST: 22 U/L (ref 15–41)
Albumin: 3.6 g/dL (ref 3.5–5.0)
Alkaline Phosphatase: 79 U/L (ref 38–126)
Anion gap: 9 (ref 5–15)
BUN: 28 mg/dL — ABNORMAL HIGH (ref 8–23)
CO2: 24 mmol/L (ref 22–32)
Calcium: 9.8 mg/dL (ref 8.9–10.3)
Chloride: 102 mmol/L (ref 98–111)
Creatinine: 0.96 mg/dL (ref 0.44–1.00)
GFR, Estimated: 55 mL/min — ABNORMAL LOW (ref >60)
Glucose, Bld: 126 mg/dL — ABNORMAL HIGH (ref 70–99)
Potassium: 4.4 mmol/L (ref 3.5–5.1)
Sodium: 135 mmol/L (ref 135–145)
Total Bilirubin: 0.5 mg/dL (ref 0.3–1.2)
Total Protein: 6.5 g/dL (ref 6.5–8.1)

## 2019-12-09 LAB — LACTATE DEHYDROGENASE: LDH: 214 U/L — ABNORMAL HIGH (ref 98–192)

## 2019-12-09 MED ORDER — CLOTRIMAZOLE 1 % EX OINT: 1.0000 "application " | TOPICAL_OINTMENT | Freq: Two times a day (BID) | CUTANEOUS | 1 refills | Status: DC

## 2019-12-09 NOTE — Progress Notes (Signed)
HEMATOLOGY/ONCOLOGY CLINIC NOTE  Date of Service: 12/09/2019  Patient Care Team: Shon Baton, MD as PCP - General (Internal Medicine) Shon Baton, MD (Internal Medicine) Shon Baton, MD (Internal Medicine) Izora Gala, MD as Consulting Physician (Otolaryngology) Eppie Gibson, MD as Attending Physician (Radiation Oncology) Brunetta Genera, MD as Consulting Physician (Hematology)  CHIEF COMPLAINTS/PURPOSE OF CONSULTATION:  Recently diagnosed Small Lymphocytic Lymphoma  HISTORY OF PRESENTING ILLNESS:   Mary Kim is a wonderful 86 y.o. female who has been referred to [place] by Dr Isidore Moos for evaluation and management of small lymphocytic lymphoma. Pt is accompanied today by her daughter. The pt reports that she is doing well overall.  The pt reports that she was washing her face about 3 months ago and noticed a lump under her chin, near her neck. She spoke to her PCP, Dr. Virgina Jock, who thought that it was a stone and referred pt to Dr. Constance Holster, ENT. Dr. Constance Holster then ordered a neck CT and a lymph node biopsy.    Pt denies feeing any different in the last 6 months to a year. She reports that she has been healthy in general and her other conditions are stable at this time. She has already had one dose of COVID19 vaccine and will receive the second in February. Pt has had her annual flu vaccine but is unsure of the last time she had her pneumonia vaccines. Pt would not like to begin treatment if it causes symptoms, as she is not currently burdened by symptoms from the disease.   Of note prior to the patient's visit today, pt has had Flow Pathology ([id number]) completed on 01/10/2019 with results revealing "- Monoclonal B-cell population identified, see comment."  Pt has had Surgical Pathology Report ([id number]) completed on 01/10/2019 with results revealing "LYMPH NODE, LEFT CERVICAL, EXCISION: - Chronic lymphocytic leukemia/small lymphocytic lymphoma."   Pt has had CT Soft Tissue  Neck ([id number]) completed on 12/08/2018 with results revealing "Cervical lymphadenopathy at both the upper and lower nodal stations bilaterally. An index left level 2 lymph node measures 2.4 x 2.0 cm, likely accounting for the palpable submandibular abnormality. Findings are highly suspicious for a lymphoproliferative process or metastatic nodal disease."  Most recent lab results (07/01/2017) of CBC is as follows: all values are WNL except for WBC at 11.4K, Neutro Rel at 41.5, Lymphs Rel at 54.3, Lymphs Abs at 6.2K.  On review of systems, pt denies fevers, chills, night sweats, unexpected weight loss, loss of appetite, back pain, abdominal pain, leg swelling and any other symptoms.   On PMHx the pt reports HLD, GERD, HTN, Osteoporosis.  INTERVAL HISTORY: Mary Kim is a wonderful 86 y.o. female who is here for evaluation and management of small lymphocytic lymphoma. We are joined today by her daughter, [name]. The patient's last visit with [place] was on 06/08/2019. The pt reports that she is doing well overall.  The pt reports that she began to itch in her left cervical area, this has since spread down her chest and underneath her breast. The area is red and she also has some clear blistering. This has been intermittent for 2-3 months. Pt has tried many OTC itching and rash solutions, but have found no long-term relief. She has not tried any topical anti-fungals. Pt has dry skin, but has no history of eczema.  Lab results today (12/09/19) of CBC w/diff and CMP is as follows: all values are WNL except for WBC at 15.3K, RBC at 3.09, Hgb at 10.8, HCT  at 32.3, MCV at 104.5, MCH at 35.0, PLT at 149K, Lymphs Abs at 10.9K, Mono Abs at 1.9K, Smudge Cells are "Present", Glucose at 126, BUN at 28, GFR Est at 55. 12/09/2019 LDH at 214  On review of systems, pt reports skin itching and denies fevers, chills, night sweats, leg swelling, back pain and any other symptoms.   MEDICAL HISTORY:  Past Medical  History:  Diagnosis Date  . Chronic cough   . Elevated cholesterol   . GERD (gastroesophageal reflux disease)   . Hearing loss    left hearing aid  . Hyperlipidemia   . Hypertension   . Lymphoma ([place])   . Osteoporosis     SURGICAL HISTORY: Past Surgical History:  Procedure Laterality Date  . CATARACT EXTRACTION  2011  . LYMPH NODE BIOPSY Left 01/10/2019   Procedure: EXCISIONAL BIOPSY OF CERVICAL LYMPH NODE/LEFT;  Surgeon: Izora Gala, MD;  Location: [place];  Service: ENT;  Laterality: Left;    SOCIAL HISTORY: Social History   Socioeconomic History  . Marital status: Widowed    Spouse name: Not on file  . Number of children: Not on file  . Years of education: Not on file  . Highest education level: Not on file  Occupational History  . Not on file  Tobacco Use  . Smoking status: Never Smoker  . Smokeless tobacco: Never Used  Substance and Sexual Activity  . Alcohol use: No  . Drug use: No  . Sexual activity: Never  Other Topics Concern  . Not on file  Social History Narrative   ** Merged History Encounter    Pt states recent bronchial diagnosis from Dr Melvyn Novas but unable to recall specific        Social Determinants of Health   Financial Resource Strain:   . Difficulty of Paying Living Expenses: Not on file  Food Insecurity:   . Worried About Charity fundraiser in the Last Year: Not on file  . Ran Out of Food in the Last Year: Not on file  Transportation Needs:   . Lack of Transportation (Medical): Not on file  . Lack of Transportation (Non-Medical): Not on file  Physical Activity:   . Days of Exercise per Week: Not on file  . Minutes of Exercise per Session: Not on file  Stress:   . Feeling of Stress : Not on file  Social Connections:   . Frequency of Communication with Friends and Family: Not on file  . Frequency of Social Gatherings with Friends and Family: Not on file  . Attends Religious Services: Not on file  . Active Member of  Clubs or Organizations: Not on file  . Attends Archivist Meetings: Not on file  . Marital Status: Not on file  Intimate Partner Violence:   . Fear of Current or Ex-Partner: Not on file  . Emotionally Abused: Not on file  . Physically Abused: Not on file  . Sexually Abused: Not on file    FAMILY HISTORY: No family history on file.  ALLERGIES:  has No Known Allergies.  MEDICATIONS:  Current Outpatient Medications  Medication Sig Dispense Refill  . amLODipine (NORVASC) 10 MG tablet Take 10 mg by mouth daily.    Marland Kitchen aspirin 81 MG tablet Take 81 mg by mouth daily.    Marland Kitchen atorvastatin (LIPITOR) 20 MG tablet Take 20 mg by mouth daily.    . Calcium Carbonate-Vitamin D (CALTRATE 600+D) 600-400 MG-UNIT per tablet Take 1 tablet by mouth  daily.    . cholecalciferol (VITAMIN D) 1000 UNITS tablet Take 1,000 Units by mouth daily.    . Clotrimazole 1 % OINT Apply 1 application topically 2 (two) times daily. To fungal rash over chest wall and under breast 56.7 g 1  . denosumab (PROLIA) 60 MG/ML SOLN injection Inject 60 mg into the skin every 6 (six) months. Given in May and Nov. - Administer in upper arm, thigh, or abdomen    . fluticasone (FLONASE) 50 MCG/ACT nasal spray Place 2 sprays into both nostrils 2 (two) times daily. 16 g 3  . latanoprost (XALATAN) 0.005 % ophthalmic solution Place 1 drop into both eyes at bedtime.     . Multiple Vitamin (MULTIVITAMIN WITH MINERALS) TABS Take 1 tablet by mouth daily.    . Multiple Vitamins-Minerals (PRESERVISION AREDS 2) CAPS Take 1 capsule by mouth daily.     Marland Kitchen omeprazole (PRILOSEC) 20 MG capsule Take 1 capsule (20 mg total) by mouth daily. 30 capsule 4  . OVER THE COUNTER MEDICATION Take 1 tablet by mouth daily. Called mucus relief, generic of mucinex dm    . Respiratory Therapy Supplies (FLUTTER) DEVI Use as directed (Patient not taking: Reported on 10/24/2019) 1 each 0  . sodium chloride HYPERTONIC 3 % nebulizer solution Take two times daily prior  to Flutter Valve use 750 mL 6   No current facility-administered medications for this visit.    REVIEW OF SYSTEMS:   A 10+ POINT REVIEW OF SYSTEMS WAS OBTAINED including neurology, dermatology, psychiatry, cardiac, respiratory, lymph, extremities, GI, GU, Musculoskeletal, constitutional, breasts, reproductive, HEENT.  All pertinent positives are noted in the HPI.  All others are negative.   PHYSICAL EXAMINATION: ECOG PERFORMANCE STATUS: 0 - Asymptomatic  . Vitals:   12/09/19 1115  BP: (!) 157/74  Pulse: 94  Resp: 18  Temp: 98.1 F (36.7 C)  SpO2: 98%   Filed Weights   12/09/19 1115  Weight: 107 lb 14.4 oz (48.9 kg)   .Body mass index is 17.96 kg/m.  Exam was given in a chair   GENERAL:alert, in no acute distress and comfortable SKIN: erythema and satellite lesions along the chest and under breast EYES: conjunctiva are pink and non-injected, sclera anicteric OROPHARYNX: MMM, no exudates, no oropharyngeal erythema or ulceration NECK: supple, no JVD LYMPH:  no palpable lymphadenopathy in the axillary or inguinal regions. Several, small cervical lymph nodes. LUNGS: clear to auscultation b/l with normal respiratory effort HEART: regular rate & rhythm ABDOMEN:  normoactive bowel sounds , non tender, not distended. No palpable hepatosplenomegaly.  Extremity: no pedal edema PSYCH: alert & oriented x 3 with fluent speech NEURO: no focal motor/sensory deficits  LABORATORY DATA:  I have reviewed the data as listed  . CBC Latest Ref Rng & Units 12/09/2019 06/23/2019 06/23/2019  WBC 4.0 - 10.5 K/uL 15.3(H) - 22.3(H)  Hemoglobin 12.0 - 15.0 g/dL 10.8(L) 12.6 12.2  Hematocrit 36 - 46 % 32.3(L) 37.0 36.3  Platelets 150 - 400 K/uL 149(L) - 143(L)    . CMP Latest Ref Rng & Units 12/09/2019 06/23/2019 06/23/2019  Glucose 70 - 99 mg/dL 126(H) 102(H) 107(H)  BUN 8 - 23 mg/dL 28(H) 25(H) 26(H)  Creatinine 0.44 - 1.00 mg/dL 0.96 0.90 1.02(H)  Sodium 135 - 145 mmol/L 135 137 136   Potassium 3.5 - 5.1 mmol/L 4.4 3.8 3.9  Chloride 98 - 111 mmol/L 102 104 102  CO2 22 - 32 mmol/L 24 - 24  Calcium 8.9 - 10.3 mg/dL 9.8 - 9.6  Total Protein 6.5 - 8.1 g/dL 6.5 - 6.9  Total Bilirubin 0.3 - 1.2 mg/dL 0.5 - 0.8  Alkaline Phos 38 - 126 U/L 79 - 82  AST 15 - 41 U/L 22 - 30  ALT 0 - 44 U/L 19 - 28   06/08/2019 FISH - CLL Prognosis Panel:   01/10/2019 Surgical Pathology Report ([id number]):   01/10/2019 Flow Pathology Report ([id number]):   RADIOGRAPHIC STUDIES: I have personally reviewed the radiological images as listed and agreed with the findings in the report. MM 3D SCREEN BREAST BILATERAL  Result Date: 11/25/2019 CLINICAL DATA:  Screening. EXAM: DIGITAL SCREENING BILATERAL MAMMOGRAM WITH TOMO AND CAD COMPARISON:  Previous exam(s). ACR Breast Density Category b: There are scattered areas of fibroglandular density. FINDINGS: There are no findings suspicious for malignancy. Images were processed with CAD. IMPRESSION: No mammographic evidence of malignancy. A result letter of this screening mammogram will be mailed directly to the patient. RECOMMENDATION: Screening mammogram in one year. (Code:SM-B-01Y) BI-RADS CATEGORY  1: Negative. Electronically Signed   By: Margarette Canada M.D.   On: 11/25/2019 16:08    ASSESSMENT & PLAN:   86 yo with  t 1) Recenly diagnosed CLL/SLL -12/08/2018 CT Soft Tissue Neck ([id number]) revealed "Cervical lymphadenopathy at both the upper and lower nodal stations bilaterally. An index left level 2 lymph node measures 2.4 x 2.0 cm, likely accounting for the palpable submandibular abnormality. Findings are highly suspicious for a lymphoproliferative process or metastatic nodal disease." -01/10/2019 Surgical Pathology Report ([id number]) revealed "Chronic lymphocytic leukemia/small lymphocytic lymphoma."  -01/10/2019 Flow Pathology ([id number]) revealed "Monoclonal B-cell population identified" PLAN: -Discussed pt labwork today,  12/09/19; WBC is stable, mild anemia, PLT are nml, chemistries are steady, LDH is borderline elevated.  -Discussed 06/08/2019 FISH - CLL Prognosis Panel which revealed a Trisomy 12 mutation, which carries an average risk.  -No lab or clinical evidence to suggest significant CLL/SLL progression requiring treatment at this time. -Discussed CDC guidelines regarding the [place] booster. She will receive at her retirement home. -Recommend lukewarm, short showers. After which she should pat dry and moisturize well.  -Advised pt that itchy skin can be caused by a Candida infection and CLL can make it more difficult to clear these infections.  -Recommend pt keep skin folds dry and abstain from wearing a wired bra as much as possible. It would be okay to wear a sports bra.  -Advised pt that CLL can cause anemia from overgrowth in the bone marrow or hemolysis - no evidence of hemolysis.  -Advised pt that age and impaired kidney function can also contribute to anemia.  -Rx Topical Antifungal -Will see back in 6 months with labs    FOLLOW UP: RTC with Dr Irene Limbo with labs in 6 months   The total time spent in the appt was 20 minutes and more than 50% was on counseling and direct patient cares.  All of the patient's questions were answered with apparent satisfaction. The patient knows to call the clinic with any problems, questions or concerns.    Sullivan Lone MD [place] AAHIVMS [facility] Hematology/Oncology Physician [facility]  (Office):       [phone] (Work cell):  [phone] (Fax):           [phone]  12/09/2019 12:36 PM  I, Yevette Edwards, am acting as a scribe for Dr. Sullivan Lone.   .I have reviewed the above documentation for accuracy and completeness, and I agree with the above. Brunetta Genera MD

## 2019-12-12 DIAGNOSIS — Z23 Encounter for immunization: Secondary | ICD-10-CM | POA: Diagnosis not present

## 2019-12-15 ENCOUNTER — Encounter: Payer: Self-pay | Admitting: Hematology

## 2019-12-15 ENCOUNTER — Other Ambulatory Visit: Payer: Self-pay | Admitting: *Deleted

## 2019-12-15 ENCOUNTER — Other Ambulatory Visit: Payer: Self-pay | Admitting: Hematology

## 2019-12-15 MED ORDER — CLOTRIMAZOLE 1 % EX OINT: 1.0000 "application " | TOPICAL_OINTMENT | Freq: Two times a day (BID) | CUTANEOUS | 1 refills | Status: DC

## 2019-12-15 NOTE — Telephone Encounter (Signed)
Patient daughter requested Rx for Clotrimazole be sent to [facility] on [address]

## 2020-01-10 DIAGNOSIS — H353133 Nonexudative age-related macular degeneration, bilateral, advanced atrophic without subfoveal involvement: Secondary | ICD-10-CM | POA: Diagnosis not present

## 2020-01-10 DIAGNOSIS — H26492 Other secondary cataract, left eye: Secondary | ICD-10-CM | POA: Diagnosis not present

## 2020-01-10 DIAGNOSIS — H0100A Unspecified blepharitis right eye, upper and lower eyelids: Secondary | ICD-10-CM | POA: Diagnosis not present

## 2020-01-10 DIAGNOSIS — H401123 Primary open-angle glaucoma, left eye, severe stage: Secondary | ICD-10-CM | POA: Diagnosis not present

## 2020-01-18 ENCOUNTER — Other Ambulatory Visit: Payer: Self-pay

## 2020-01-18 ENCOUNTER — Emergency Department (HOSPITAL_COMMUNITY): Payer: Medicare Other

## 2020-01-18 ENCOUNTER — Emergency Department (HOSPITAL_BASED_OUTPATIENT_CLINIC_OR_DEPARTMENT_OTHER)
Admission: EM | Admit: 2020-01-18 | Discharge: 2020-01-18 | Disposition: A | Discharge status: Home / Self Care | Payer: Medicare Other | Attending: Emergency Medicine | Admitting: Emergency Medicine

## 2020-01-18 ENCOUNTER — Emergency Department (HOSPITAL_BASED_OUTPATIENT_CLINIC_OR_DEPARTMENT_OTHER): Payer: Medicare Other

## 2020-01-18 ENCOUNTER — Encounter (HOSPITAL_BASED_OUTPATIENT_CLINIC_OR_DEPARTMENT_OTHER): Payer: Self-pay

## 2020-01-18 DIAGNOSIS — S2232XA Fracture of one rib, left side, initial encounter for closed fracture: Secondary | ICD-10-CM | POA: Diagnosis not present

## 2020-01-18 DIAGNOSIS — Y9301 Activity, walking, marching and hiking: Secondary | ICD-10-CM | POA: Diagnosis not present

## 2020-01-18 DIAGNOSIS — S42112A Displaced fracture of body of scapula, left shoulder, initial encounter for closed fracture: Secondary | ICD-10-CM | POA: Diagnosis not present

## 2020-01-18 DIAGNOSIS — S299XXA Unspecified injury of thorax, initial encounter: Secondary | ICD-10-CM | POA: Diagnosis present

## 2020-01-18 DIAGNOSIS — Z79899 Other long term (current) drug therapy: Secondary | ICD-10-CM | POA: Insufficient documentation

## 2020-01-18 DIAGNOSIS — I700 Atherosclerosis of aorta: Secondary | ICD-10-CM | POA: Diagnosis not present

## 2020-01-18 DIAGNOSIS — I10 Essential (primary) hypertension: Secondary | ICD-10-CM | POA: Insufficient documentation

## 2020-01-18 DIAGNOSIS — W19XXXA Unspecified fall, initial encounter: Secondary | ICD-10-CM | POA: Diagnosis not present

## 2020-01-18 DIAGNOSIS — Z7982 Long term (current) use of aspirin: Secondary | ICD-10-CM | POA: Diagnosis not present

## 2020-01-18 DIAGNOSIS — S42102A Fracture of unspecified part of scapula, left shoulder, initial encounter for closed fracture: Secondary | ICD-10-CM | POA: Diagnosis not present

## 2020-01-18 DIAGNOSIS — Y92009 Unspecified place in unspecified non-institutional (private) residence as the place of occurrence of the external cause: Secondary | ICD-10-CM | POA: Insufficient documentation

## 2020-01-18 DIAGNOSIS — J9811 Atelectasis: Secondary | ICD-10-CM | POA: Diagnosis not present

## 2020-01-18 DIAGNOSIS — M419 Scoliosis, unspecified: Secondary | ICD-10-CM | POA: Diagnosis not present

## 2020-01-18 DIAGNOSIS — M25512 Pain in left shoulder: Secondary | ICD-10-CM | POA: Diagnosis not present

## 2020-01-18 MED ORDER — ACETAMINOPHEN 325 MG PO TABS
650.0000 mg | ORAL_TABLET | Freq: Once | ORAL | Status: AC
  Administered 2020-01-18: 650 mg via ORAL
  Filled 2020-01-18: qty 2

## 2020-01-18 NOTE — ED Triage Notes (Addendum)
Pt states she was cooking and hadn't eaten and was standing for a long period and was walking to living room when she felt lightheaded and she was reaching for something and fell. Denies hitting head, no LOC. Pain to left shoulder with movement, and sore to left ribs. Not on thinners

## 2020-01-18 NOTE — Discharge Instructions (Addendum)
Call your primary care doctor or specialist as discussed in the next 2-3 days.   Return immediately back to the ER if:  Your symptoms worsen within the next 12-24 hours. You develop new symptoms such as new fevers, persistent vomiting, new pain, shortness of breath, or new weakness or numbness, or if you have any other concerns.  

## 2020-01-18 NOTE — ED Provider Notes (Signed)
[facility] EMERGENCY DEPARTMENT Provider Note   CSN: [id number] Arrival date & time: 01/18/20  1105     History Chief Complaint  Patient presents with  . Fall    Mary Kim is a 86 y.o. female.  Patient presents with lightheadedness and a fall with left shoulder and left rib pain.  Incident occurred earlier today.  Denies headache or neck pain or back pain.  Denies fever vomiting cough or diarrhea.  Denies loss consciousness.  States that she was up early helping cook and had not eaten any food when she got lightheaded and fell.        Past Medical History:  Diagnosis Date  . Chronic cough   . Elevated cholesterol   . GERD (gastroesophageal reflux disease)   . Hearing loss    left hearing aid  . Hyperlipidemia   . Hypertension   . Lymphoma ([place])   . Osteoporosis     Patient Active Problem List   Diagnosis Date Noted  . CLL (chronic lymphocytic leukemia) ([place]) 02/02/2019  . Dizziness 03/23/2018  . Allergic rhinitis 11/10/2017  . Eustachian tube dysfunction, unspecified laterality 11/10/2017  . Obstructive bronchiectasis ([place]) 07/08/2017  . Chronic cough 07/01/2017    Past Surgical History:  Procedure Laterality Date  . CATARACT EXTRACTION  2011  . LYMPH NODE BIOPSY Left 01/10/2019   Procedure: EXCISIONAL BIOPSY OF CERVICAL LYMPH NODE/LEFT;  Surgeon: Izora Gala, MD;  Location: [place];  Service: ENT;  Laterality: Left;     OB History    Gravida  0   Para  0   Term  0   Preterm  0   AB  0   Living        SAB  0   IAB  0   Ectopic  0   Multiple      Live Births              History reviewed. No pertinent family history.  Social History   Tobacco Use  . Smoking status: Never Smoker  . Smokeless tobacco: Never Used  Substance Use Topics  . Alcohol use: No  . Drug use: No    Home Medications Prior to Admission medications   Medication Sig Start Date End Date Taking? Authorizing Provider   amLODipine (NORVASC) 10 MG tablet Take 10 mg by mouth daily.    [provider]  aspirin 81 MG tablet Take 81 mg by mouth daily.    [provider]  atorvastatin (LIPITOR) 20 MG tablet Take 20 mg by mouth daily.    [provider]  Calcium Carbonate-Vitamin D (CALTRATE 600+D) 600-400 MG-UNIT per tablet Take 1 tablet by mouth daily.    [provider]  cholecalciferol (VITAMIN D) 1000 UNITS tablet Take 1,000 Units by mouth daily.    [provider]  Clotrimazole 1 % OINT Apply 1 application topically 2 (two) times daily. To fungal rash over chest wall and under breast 12/15/19   Brunetta Genera, MD  denosumab (PROLIA) 60 MG/ML SOLN injection Inject 60 mg into the skin every 6 (six) months. Given in May and Nov. - Administer in upper arm, thigh, or abdomen    [provider]  fluticasone (FLONASE) 50 MCG/ACT nasal spray Place 2 sprays into both nostrils 2 (two) times daily. 11/10/17   Lauraine Rinne, NP  latanoprost (XALATAN) 0.005 % ophthalmic solution Place 1 drop into both eyes at bedtime.  11/10/18   [provider]  Multiple Vitamin (MULTIVITAMIN WITH MINERALS) TABS Take 1 tablet by mouth daily.    [provider]  Multiple Vitamins-Minerals (PRESERVISION AREDS 2) CAPS Take 1 capsule by mouth daily.     [provider]  omeprazole (PRILOSEC) 20 MG capsule Take 1 capsule (20 mg total) by mouth daily. 11/10/17   Lauraine Rinne, NP  OVER THE COUNTER MEDICATION Take 1 tablet by mouth daily. Called mucus relief, generic of mucinex dm    [provider]  Respiratory Therapy Supplies (FLUTTER) DEVI Use as directed Patient not taking: Reported on 10/24/2019 07/01/17   Tanda Rockers, MD  sodium chloride HYPERTONIC 3 % nebulizer solution Take two times daily prior to Flutter Valve use 11/15/19   Tanda Rockers, MD    Allergies    Patient has no known allergies.  Review of Systems   Review of Systems   Constitutional: Negative for fever.  HENT: Negative for ear pain.   Eyes: Negative for pain.  Respiratory: Negative for cough.   Cardiovascular: Negative for chest pain.  Gastrointestinal: Negative for abdominal pain.  Genitourinary: Negative for flank pain.  Musculoskeletal: Negative for back pain.  Skin: Negative for rash.  Neurological: Negative for headaches.    Physical Exam Updated Vital Signs BP (!) 180/91   Pulse 96   Temp 98 F (36.7 C) (Oral)   Resp 14   Ht 5\' 5"  (1.651 m)   Wt 46.7 kg   SpO2 96%   BMI 17.14 kg/m   Physical Exam Constitutional:      General: She is not in acute distress.    Appearance: Normal appearance.  HENT:     Head: Normocephalic.     Nose: Nose normal.  Eyes:     Extraocular Movements: Extraocular movements intact.  Cardiovascular:     Rate and Rhythm: Normal rate.  Pulmonary:     Effort: Pulmonary effort is normal.  Abdominal:     Palpations: Abdomen is soft.     Tenderness: There is no abdominal tenderness.  Musculoskeletal:     Cervical back: Normal range of motion.     Comments: Pain with range of motion of left shoulder.  Pain with tenderness palpation on the left lateral chest wall.  Skin:    General: Skin is warm.  Neurological:     General: No focal deficit present.     Mental Status: She is alert.     ED Results / Procedures / Treatments   Labs (all labs ordered are listed, but only abnormal results are displayed) Labs Reviewed - No data to display  EKG None  Radiology DG Shoulder Left  Result Date: 01/18/2020 CLINICAL DATA:  Pain following fall EXAM: LEFT SHOULDER - 2+ VIEW COMPARISON:  May 14, 2018 FINDINGS: Oblique and Y scapular images were obtained. No acute fracture or dislocation. There is moderate generalized joint space narrowing, similar to prior study. No erosive change. Minimal calcification noted in the lateral aspect of the joint. Visualized upper lung regions clear on the left. There is aortic  atherosclerosis. IMPRESSION: Generalized osteoarthritic change, stable from prior study. Probable slight calcific tendinosis laterally. No fracture or dislocation. No erosion. Aortic Atherosclerosis (ICD10-I70.0). Electronically Signed   By: Lowella Grip III M.D.   On: 01/18/2020 12:19    Procedures .Ortho Injury Treatment  Date/Time: 01/18/2020 2:52 PM Performed by: Luna Fuse, MD Authorized by: Luna Fuse, MD  Comments: Sling placement to left upper extremity.  Patient tolerated procedure well, neurovascularly  intact after placement.    (including critical care time)  Medications Ordered in ED Medications  acetaminophen (TYLENOL) tablet 650 mg (650 mg Oral Given 01/18/20 1247)    ED Course  I have reviewed the triage vital signs and the nursing notes.  Pertinent labs & imaging results that were available during my care of the patient were reviewed by me and considered in my medical decision making (see chart for details).    MDM Rules/Calculators/A&P                          X-ray show no acute shoulder fracture or humeral fracture.  However patient does appear to have rib fracture, perhaps scapular fracture.  Patient monitored in the ER for several hours with no decrease in vital signs.  She appears comfortable at rest although having pain with certain motions.  Will recommend outpatient follow-up in 2 or 3 days.  Recommend immediate return for worsening pain fevers or any additional concerns.   Final Clinical Impression(s) / ED Diagnoses Final diagnoses:  Fall    Rx / DC Orders ED Discharge Orders    None       Luna Fuse, MD 01/18/20 1452

## 2020-01-23 ENCOUNTER — Emergency Department (HOSPITAL_BASED_OUTPATIENT_CLINIC_OR_DEPARTMENT_OTHER)
Admission: EM | Admit: 2020-01-23 | Discharge: 2020-01-23 | Disposition: A | Discharge status: Home / Self Care | Payer: Medicare Other | Attending: Emergency Medicine | Admitting: Emergency Medicine

## 2020-01-23 ENCOUNTER — Encounter (HOSPITAL_BASED_OUTPATIENT_CLINIC_OR_DEPARTMENT_OTHER): Payer: Self-pay | Admitting: *Deleted

## 2020-01-23 ENCOUNTER — Other Ambulatory Visit: Payer: Self-pay

## 2020-01-23 DIAGNOSIS — R0781 Pleurodynia: Secondary | ICD-10-CM | POA: Diagnosis not present

## 2020-01-23 DIAGNOSIS — Z5321 Procedure and treatment not carried out due to patient leaving prior to being seen by health care provider: Secondary | ICD-10-CM | POA: Diagnosis not present

## 2020-01-23 NOTE — ED Triage Notes (Signed)
She lives at [facility]. She fell last week and broke her scapula and left ribs. She is here today due to pain and being unable to get up at night.

## 2020-02-21 DIAGNOSIS — M546 Pain in thoracic spine: Secondary | ICD-10-CM | POA: Diagnosis not present

## 2020-02-21 DIAGNOSIS — R0781 Pleurodynia: Secondary | ICD-10-CM | POA: Diagnosis not present

## 2020-02-21 DIAGNOSIS — M25512 Pain in left shoulder: Secondary | ICD-10-CM | POA: Diagnosis not present

## 2020-05-18 ENCOUNTER — Encounter (HOSPITAL_COMMUNITY): Payer: Self-pay

## 2020-05-18 ENCOUNTER — Ambulatory Visit (HOSPITAL_COMMUNITY)
Admission: EM | Admit: 2020-05-18 | Discharge: 2020-05-18 | Disposition: A | Discharge status: Home / Self Care | Payer: Medicare Other | Attending: Medical Oncology | Admitting: Medical Oncology

## 2020-05-18 ENCOUNTER — Other Ambulatory Visit: Payer: Self-pay

## 2020-05-18 DIAGNOSIS — W19XXXA Unspecified fall, initial encounter: Secondary | ICD-10-CM | POA: Diagnosis not present

## 2020-05-18 DIAGNOSIS — T148XXA Other injury of unspecified body region, initial encounter: Secondary | ICD-10-CM

## 2020-05-18 MED ORDER — TETANUS-DIPHTHERIA TOXOIDS TD 5-2 LFU IM INJ: 0.5000 mL | INJECTION | Freq: Once | INTRAMUSCULAR | 0 refills | Status: AC

## 2020-05-18 MED ORDER — SILVER SULFADIAZINE 1 % EX CREA: 1.0000 "application " | TOPICAL_CREAM | Freq: Two times a day (BID) | CUTANEOUS | 0 refills | Status: DC

## 2020-05-18 NOTE — ED Triage Notes (Signed)
Pt presents with severe skin tear to upper right humerus area after a fall in her bathroom a week ago.

## 2020-05-18 NOTE — ED Provider Notes (Signed)
[place]    CSN: [id number] Arrival date & time: 05/18/20  1321      History   Chief Complaint Chief Complaint  Patient presents with  . Fall  . Abrasion    HPI Mary Kim is a 87 y.o. female.   HPI   Abrasion of the skin: Patient reports with her daughter although she is able to give her full history.  Patient reports that a week ago she had a minor fall in her bathroom but had an abrasion of her right humerus area.  The area did bleed a decent amount when the abrasion first occurred but is no longer bleeding.  There is no discharge and she has not had any fever or flulike symptoms.  Other than keeping this clean and covered they have not applied any treatment.  The abrasion area is still tender to palpation.  In terms of the fall she denies hitting her head or loss of consciousness.  She did bump her right shoulder during the fall but denies any significant pain.  Of note patient does have a history of osteoporosis and is on Prolia but she has been without her Prolia injection for about a year.  She has follow-up with her PCP next week to discuss this.  Past Medical History:  Diagnosis Date  . Chronic cough   . Elevated cholesterol   . GERD (gastroesophageal reflux disease)   . Hearing loss    left hearing aid  . Hyperlipidemia   . Hypertension   . Lymphoma ([place])   . Osteoporosis     Patient Active Problem List   Diagnosis Date Noted  . CLL (chronic lymphocytic leukemia) ([place]) 02/02/2019  . Dizziness 03/23/2018  . Allergic rhinitis 11/10/2017  . Eustachian tube dysfunction, unspecified laterality 11/10/2017  . Obstructive bronchiectasis ([place]) 07/08/2017  . Chronic cough 07/01/2017    Past Surgical History:  Procedure Laterality Date  . CATARACT EXTRACTION  2011  . LYMPH NODE BIOPSY Left 01/10/2019   Procedure: EXCISIONAL BIOPSY OF CERVICAL LYMPH NODE/LEFT;  Surgeon: Izora Gala, MD;  Location: [facility];  Service: ENT;   Laterality: Left;    OB History    Gravida  0   Para  0   Term  0   Preterm  0   AB  0   Living        SAB  0   IAB  0   Ectopic  0   Multiple      Live Births               Home Medications    Prior to Admission medications   Medication Sig Start Date End Date Taking? Authorizing Provider  silver sulfADIAZINE (SILVADENE) 1 % cream Apply 1 application topically 2 (two) times daily. 05/18/20  Yes Hughie Closs, PA-C  tetanus & diphtheria toxoids, adult, (TENIVAC) 5-2 LFU injection Inject 0.5 mLs into the muscle once for 1 dose. 05/18/20 05/18/20 Yes Taegen Lennox, Holli Humbles, PA-C  amLODipine (NORVASC) 10 MG tablet Take 10 mg by mouth daily.    [provider]  aspirin 81 MG tablet Take 81 mg by mouth daily.    [provider]  atorvastatin (LIPITOR) 20 MG tablet Take 20 mg by mouth daily.    [provider]  Calcium Carbonate-Vitamin D (CALTRATE 600+D) 600-400 MG-UNIT per tablet Take 1 tablet by mouth daily.    [provider]  cholecalciferol (VITAMIN D) 1000 UNITS tablet Take 1,000  Units by mouth daily.    [provider]  Clotrimazole 1 % OINT Apply 1 application topically 2 (two) times daily. To fungal rash over chest wall and under breast 12/15/19   Brunetta Genera, MD  denosumab (PROLIA) 60 MG/ML SOLN injection Inject 60 mg into the skin every 6 (six) months. Given in May and Nov. - Administer in upper arm, thigh, or abdomen    [provider]  fluticasone (FLONASE) 50 MCG/ACT nasal spray Place 2 sprays into both nostrils 2 (two) times daily. 11/10/17   Lauraine Rinne, NP  latanoprost (XALATAN) 0.005 % ophthalmic solution Place 1 drop into both eyes at bedtime.  11/10/18   [provider]  Multiple Vitamin (MULTIVITAMIN WITH MINERALS) TABS Take 1 tablet by mouth daily.    [provider]  Multiple Vitamins-Minerals (PRESERVISION AREDS 2) CAPS Take 1 capsule by mouth daily.     [provider]  omeprazole (PRILOSEC) 20 MG capsule Take 1 capsule (20 mg total) by mouth daily. 11/10/17   Lauraine Rinne, NP  OVER THE COUNTER MEDICATION Take 1 tablet by mouth daily. Called mucus relief, generic of mucinex dm    [provider]  Respiratory Therapy Supplies (FLUTTER) DEVI Use as directed Patient not taking: Reported on 10/24/2019 07/01/17   Tanda Rockers, MD  sodium chloride HYPERTONIC 3 % nebulizer solution Take two times daily prior to Flutter Valve use 11/15/19   Tanda Rockers, MD    Family History History reviewed. No pertinent family history.  Social History Social History   Tobacco Use  . Smoking status: Never Smoker  . Smokeless tobacco: Never Used  Substance Use Topics  . Alcohol use: No  . Drug use: No     Allergies   Patient has no known allergies.   Review of Systems Review of Systems  As stated above in HPI Physical Exam Triage Vital Signs ED Triage Vitals  Enc Vitals Group     BP 05/18/20 1353 (!) 150/79     Pulse Rate 05/18/20 1353 90     Resp 05/18/20 1353 16     Temp 05/18/20 1353 98.9 F (37.2 C)     Temp Source 05/18/20 1353 Oral     SpO2 05/18/20 1353 95 %     Weight --      Height --      Head Circumference --      Peak Flow --      Pain Score 05/18/20 1351 5     Pain Loc --      Pain Edu? --      Excl. in [place]? --    No data found.  Updated Vital Signs BP (!) 150/79 (BP Location: Left Arm)   Pulse 90   Temp 98.9 F (37.2 C) (Oral)   Resp 16   SpO2 95%   Physical Exam Vitals and nursing note reviewed.  Constitutional:      Appearance: Normal appearance.  HENT:     Head: Normocephalic and atraumatic.  Musculoskeletal:        General: No swelling, tenderness, deformity or signs of injury. Normal range of motion.     Cervical back: Normal range of motion.  Skin:    General: Skin is warm.     Findings: Erythema (There is a large superficial abrasion of the right upper arm. No surrounding erythema,  bleeding or discharge) present.  Neurological:     Mental Status: She is alert and oriented  to person, place, and time.      UC Treatments / Results  Labs (all labs ordered are listed, but only abnormal results are displayed) Labs Reviewed - No data to display  EKG   Radiology No results found.  Procedures Procedures (including critical care time)  Medications Ordered in UC Medications - No data to display  Initial Impression / Assessment and Plan / UC Course  I have reviewed the triage vital signs and the nursing notes.  Pertinent labs & imaging results that were available during my care of the patient were reviewed by me and considered in my medical decision making (see chart for details).     New.  I have discussed with patient and her daughter that she is at increased risk for fracture however her exam is very reassuring against this.  I could certainly justify both completing an x-ray and not completing an x-ray based on their preference.  They elect to hold off on x-ray at this time as she is not having any discomfort or tenderness to palpation.  In terms of her skin abrasion we applied Silvadene cream and I have sent her home with a prescription for such.  They will keep this area covered and she will increase her hydration slightly to make up for the loss of this large abrasion.  We discussed red flag signs and symptoms.  There does not appear to be any evidence of infection at this time however we did review signs and symptoms.  Td to be sent to the pharmacy as she is overdue and we discussed that insurance will likely only cover this at pharmacy. Final Clinical Impressions(s) / UC Diagnoses   Final diagnoses:  Skin abrasion  Fall, initial encounter   Discharge Instructions   None    ED Prescriptions    Medication Sig Dispense Auth. Provider   tetanus & diphtheria toxoids, adult, (TENIVAC) 5-2 LFU injection Inject 0.5 mLs into the muscle once for 1 dose. 0.5 mL  Aryah Doering M, PA-C   silver sulfADIAZINE (SILVADENE) 1 % cream Apply 1 application topically 2 (two) times daily. 50 g Jamyson Jirak M, [place]     PDMP not reviewed this encounter.   Hughie Closs, [place] 05/18/20 1519

## 2020-05-22 DIAGNOSIS — D692 Other nonthrombocytopenic purpura: Secondary | ICD-10-CM | POA: Diagnosis not present

## 2020-05-22 DIAGNOSIS — I129 Hypertensive chronic kidney disease with stage 1 through stage 4 chronic kidney disease, or unspecified chronic kidney disease: Secondary | ICD-10-CM | POA: Diagnosis not present

## 2020-05-22 DIAGNOSIS — I872 Venous insufficiency (chronic) (peripheral): Secondary | ICD-10-CM | POA: Diagnosis not present

## 2020-05-22 DIAGNOSIS — N1831 Chronic kidney disease, stage 3a: Secondary | ICD-10-CM | POA: Diagnosis not present

## 2020-05-22 DIAGNOSIS — M810 Age-related osteoporosis without current pathological fracture: Secondary | ICD-10-CM | POA: Diagnosis not present

## 2020-05-22 DIAGNOSIS — D72829 Elevated white blood cell count, unspecified: Secondary | ICD-10-CM | POA: Diagnosis not present

## 2020-05-22 DIAGNOSIS — E785 Hyperlipidemia, unspecified: Secondary | ICD-10-CM | POA: Diagnosis not present

## 2020-05-22 DIAGNOSIS — C9110 Chronic lymphocytic leukemia of B-cell type not having achieved remission: Secondary | ICD-10-CM | POA: Diagnosis not present

## 2020-05-22 DIAGNOSIS — I708 Atherosclerosis of other arteries: Secondary | ICD-10-CM | POA: Diagnosis not present

## 2020-05-22 DIAGNOSIS — E46 Unspecified protein-calorie malnutrition: Secondary | ICD-10-CM | POA: Diagnosis not present

## 2020-05-22 DIAGNOSIS — E559 Vitamin D deficiency, unspecified: Secondary | ICD-10-CM | POA: Diagnosis not present

## 2020-05-22 DIAGNOSIS — J479 Bronchiectasis, uncomplicated: Secondary | ICD-10-CM | POA: Diagnosis not present

## 2020-06-01 ENCOUNTER — Other Ambulatory Visit (HOSPITAL_COMMUNITY): Payer: Self-pay | Admitting: *Deleted

## 2020-06-01 DIAGNOSIS — Z9181 History of falling: Secondary | ICD-10-CM | POA: Diagnosis not present

## 2020-06-01 DIAGNOSIS — R2689 Other abnormalities of gait and mobility: Secondary | ICD-10-CM | POA: Diagnosis not present

## 2020-06-01 DIAGNOSIS — R2681 Unsteadiness on feet: Secondary | ICD-10-CM | POA: Diagnosis not present

## 2020-06-01 DIAGNOSIS — M6281 Muscle weakness (generalized): Secondary | ICD-10-CM | POA: Diagnosis not present

## 2020-06-04 ENCOUNTER — Inpatient Hospital Stay (HOSPITAL_COMMUNITY): Admission: RE | Admit: 2020-06-04 | Payer: Medicare Other | Source: Ambulatory Visit

## 2020-06-05 DIAGNOSIS — R2681 Unsteadiness on feet: Secondary | ICD-10-CM | POA: Diagnosis not present

## 2020-06-05 DIAGNOSIS — M6281 Muscle weakness (generalized): Secondary | ICD-10-CM | POA: Diagnosis not present

## 2020-06-05 DIAGNOSIS — R2689 Other abnormalities of gait and mobility: Secondary | ICD-10-CM | POA: Diagnosis not present

## 2020-06-05 DIAGNOSIS — Z9181 History of falling: Secondary | ICD-10-CM | POA: Diagnosis not present

## 2020-06-06 NOTE — Progress Notes (Signed)
HEMATOLOGY/ONCOLOGY CLINIC NOTE  Date of Service: 06/07/2020  Patient Care Team: Shon Baton, MD as PCP - General (Internal Medicine) Shon Baton, MD (Internal Medicine) Shon Baton, MD (Internal Medicine) Izora Gala, MD as Consulting Physician (Otolaryngology) Eppie Gibson, MD as Attending Physician (Radiation Oncology) Brunetta Genera, MD as Consulting Physician (Hematology)  CHIEF COMPLAINTS/PURPOSE OF CONSULTATION:  Recently diagnosed Small Lymphocytic Lymphoma  HISTORY OF PRESENTING ILLNESS:   Mary Kim is a wonderful 87 y.o. female who has been referred to [place] by Dr Isidore Moos for evaluation and management of small lymphocytic lymphoma. Pt is accompanied today by her daughter. The pt reports that she is doing well overall.  The pt reports that she was washing her face about 3 months ago and noticed a lump under her chin, near her neck. She spoke to her PCP, Dr. Virgina Jock, who thought that it was a stone and referred pt to Dr. Constance Holster, ENT. Dr. Constance Holster then ordered a neck CT and a lymph node biopsy.    Pt denies feeing any different in the last 6 months to a year. She reports that she has been healthy in general and her other conditions are stable at this time. She has already had one dose of COVID19 vaccine and will receive the second in February. Pt has had her annual flu vaccine but is unsure of the last time she had her pneumonia vaccines. Pt would not like to begin treatment if it causes symptoms, as she is not currently burdened by symptoms from the disease.   Of note prior to the patient's visit today, pt has had Flow Pathology ([id number]) completed on 01/10/2019 with results revealing "- Monoclonal B-cell population identified, see comment."  Pt has had Surgical Pathology Report ([id number]) completed on 01/10/2019 with results revealing "LYMPH NODE, LEFT CERVICAL, EXCISION: - Chronic lymphocytic leukemia/small lymphocytic lymphoma."   Pt has had CT Soft Tissue Neck  ([id number]) completed on 12/08/2018 with results revealing "Cervical lymphadenopathy at both the upper and lower nodal stations bilaterally. An index left level 2 lymph node measures 2.4 x 2.0 cm, likely accounting for the palpable submandibular abnormality. Findings are highly suspicious for a lymphoproliferative process or metastatic nodal disease."  Most recent lab results (07/01/2017) of CBC is as follows: all values are WNL except for WBC at 11.4K, Neutro Rel at 41.5, Lymphs Rel at 54.3, Lymphs Abs at 6.2K.  On review of systems, pt denies fevers, chills, night sweats, unexpected weight loss, loss of appetite, back pain, abdominal pain, leg swelling and any other symptoms.   On PMHx the pt reports HLD, GERD, HTN, Osteoporosis.  INTERVAL HISTORY:  Mary Kim is a wonderful 87 y.o. female who is here for evaluation and management of small lymphocytic lymphoma. We are joined today by her daughter, Mary Kim. The patient's last visit with [place] was on 12/09/2019. The pt reports that she is doing well overall.  The pt reports that she had a recent fall with a cracked rib and scapula in December that had her in a sling. The pt notes she had a syncope and denies tripping. The pt notes that it was very hot in the room. The pt notes another fall in April in her bathroom that caused an abrasion in her skin with no breakage. She has been improving since both of these falls. The pt is doing PT and continuing to improve. The pt notes no other concerns or symptoms. She is not eating enough according to her daughter, but the pt  is not losing much weight. The pt notes the food at where she lives is not very good at all. She continues to supplement this with other things.  Lab results today 06/07/2020 of CBC w/diff and CMP is as follows: all values are WNL except for WBC of 21.0K, RBC of 3.11, Hgb of 11.0, HCT of 32.7, MCV of 32.7, MCH of 105.1, MCH of 35.4, GFR est of 60. 06/07/2020 LDH . Lab Results  Component  Value Date   LDH 203 (H) 06/07/2020   On review of systems, pt reports recent falls and denies fevers, chills, night sweats, new lumps/bumps, acute rashes, infection issues, UTI, SOB, chest pain, abdominal pain/distention, leg swelling, and any other symptoms.  MEDICAL HISTORY:  Past Medical History:  Diagnosis Date  . Chronic cough   . Elevated cholesterol   . GERD (gastroesophageal reflux disease)   . Hearing loss    left hearing aid  . Hyperlipidemia   . Hypertension   . Lymphoma ([place])   . Osteoporosis     SURGICAL HISTORY: Past Surgical History:  Procedure Laterality Date  . CATARACT EXTRACTION  2011  . LYMPH NODE BIOPSY Left 01/10/2019   Procedure: EXCISIONAL BIOPSY OF CERVICAL LYMPH NODE/LEFT;  Surgeon: Izora Gala, MD;  Location: [facility];  Service: ENT;  Laterality: Left;    SOCIAL HISTORY: Social History   Socioeconomic History  . Marital status: Widowed    Spouse name: Not on file  . Number of children: Not on file  . Years of education: Not on file  . Highest education level: Not on file  Occupational History  . Not on file  Tobacco Use  . Smoking status: Never Smoker  . Smokeless tobacco: Never Used  Substance and Sexual Activity  . Alcohol use: No  . Drug use: No  . Sexual activity: Never  Other Topics Concern  . Not on file  Social History Narrative   ** Merged History Encounter    Pt states recent bronchial diagnosis from Dr Melvyn Novas but unable to recall specific        Social Determinants of Health   Financial Resource Strain: Not on file  Food Insecurity: Not on file  Transportation Needs: Not on file  Physical Activity: Not on file  Stress: Not on file  Social Connections: Not on file  Intimate Partner Violence: Not on file    FAMILY HISTORY: No family history on file.  ALLERGIES:  has No Known Allergies.  MEDICATIONS:  Current Outpatient Medications  Medication Sig Dispense Refill  . amLODipine (NORVASC) 10 MG  tablet Take 10 mg by mouth daily.    Marland Kitchen aspirin 81 MG tablet Take 81 mg by mouth daily. (Patient not taking: Reported on 06/07/2020)    . atorvastatin (LIPITOR) 20 MG tablet Take 20 mg by mouth daily.    . Calcium Carbonate-Vitamin D (CALTRATE 600+D) 600-400 MG-UNIT per tablet Take 1 tablet by mouth daily.    . cholecalciferol (VITAMIN D) 1000 UNITS tablet Take 1,000 Units by mouth daily.    . Clotrimazole 1 % OINT Apply 1 application topically 2 (two) times daily. To fungal rash over chest wall and under breast 56.7 g 1  . denosumab (PROLIA) 60 MG/ML SOLN injection Inject 60 mg into the skin every 6 (six) months. Given in May and Nov. - Administer in upper arm, thigh, or abdomen    . fluticasone (FLONASE) 50 MCG/ACT nasal spray Place 2 sprays into both nostrils 2 (two) times daily. [place]  g 3  . latanoprost (XALATAN) 0.005 % ophthalmic solution Place 1 drop into both eyes at bedtime.     . Multiple Vitamin (MULTIVITAMIN WITH MINERALS) TABS Take 1 tablet by mouth daily.    . Multiple Vitamins-Minerals (PRESERVISION AREDS 2) CAPS Take 1 capsule by mouth daily.     Marland Kitchen omeprazole (PRILOSEC) 20 MG capsule Take 1 capsule (20 mg total) by mouth daily. 30 capsule 4  . OVER THE COUNTER MEDICATION Take 1 tablet by mouth daily. Called mucus relief, generic of mucinex dm    . Respiratory Therapy Supplies (FLUTTER) DEVI Use as directed (Patient not taking: Reported on 10/24/2019) 1 each 0  . silver sulfADIAZINE (SILVADENE) 1 % cream Apply 1 application topically 2 (two) times daily. (Patient not taking: Reported on 06/07/2020) 50 g 0  . sodium chloride HYPERTONIC 3 % nebulizer solution Take two times daily prior to Flutter Valve use 750 mL 6   No current facility-administered medications for this visit.    REVIEW OF SYSTEMS:   10 Point review of Systems was done is negative except as noted above.  PHYSICAL EXAMINATION: ECOG PERFORMANCE STATUS: 0 - Asymptomatic  . Vitals:   06/07/20 1139  BP: (!) 148/80   Pulse: (!) 114  Resp: 16  Temp: 97.6 F (36.4 C)  SpO2: 97%   Filed Weights   06/07/20 1139  Weight: 102 lb 4.8 oz (46.4 kg)   .Body mass index is 17.02 kg/m.   GENERAL:alert, in no acute distress and comfortable SKIN: erythema and satellite lesions along the chest and under breast EYES: conjunctiva are pink and non-injected, sclera anicteric OROPHARYNX: MMM, no exudates, no oropharyngeal erythema or ulceration NECK: supple, no JVD LYMPH:  no palpable lymphadenopathy in the axillary or inguinal regions. Several, small cervical lymph nodes. LUNGS: clear to auscultation b/l with normal respiratory effort HEART: regular rate & rhythm ABDOMEN:  normoactive bowel sounds , non tender, not distended. No palpable hepatosplenomegaly.  Extremity: no pedal edema PSYCH: alert & oriented x 3 with fluent speech NEURO: no focal motor/sensory deficits  LABORATORY DATA:  I have reviewed the data as listed  . CBC Latest Ref Rng & Units 06/07/2020 12/09/2019 06/23/2019  WBC 4.0 - 10.5 K/uL 21.0(H) 15.3(H) -  Hemoglobin 12.0 - 15.0 g/dL 11.0(L) 10.8(L) 12.6  Hematocrit 36.0 - 46.0 % 32.7(L) 32.3(L) 37.0  Platelets 150 - 400 K/uL 159 149(L) -    . CMP Latest Ref Rng & Units 06/07/2020 12/09/2019 06/23/2019  Glucose 70 - 99 mg/dL 97 126(H) 102(H)  BUN 8 - 23 mg/dL 22 28(H) 25(H)  Creatinine 0.44 - 1.00 mg/dL 0.89 0.96 0.90  Sodium 135 - 145 mmol/L 137 135 137  Potassium 3.5 - 5.1 mmol/L 4.2 4.4 3.8  Chloride 98 - 111 mmol/L 101 102 104  CO2 22 - 32 mmol/L 28 24 -  Calcium 8.9 - 10.3 mg/dL 10.3 9.8 -  Total Protein 6.5 - 8.1 g/dL 6.6 6.5 -  Total Bilirubin 0.3 - 1.2 mg/dL 0.4 0.5 -  Alkaline Phos 38 - 126 U/L 104 79 -  AST 15 - 41 U/L 28 22 -  ALT 0 - 44 U/L 23 19 -   06/08/2019 FISH - CLL Prognosis Panel:   01/10/2019 Surgical Pathology Report ([id number]):   01/10/2019 Flow Pathology Report ([id number]):   RADIOGRAPHIC STUDIES: I have personally reviewed the  radiological images as listed and agreed with the findings in the report. No results found.  ASSESSMENT & PLAN:   87 yo with  t 1) Recenly diagnosed CLL/SLL -12/08/2018 CT Soft Tissue Neck ([id number]) revealed "Cervical lymphadenopathy at both the upper and lower nodal stations bilaterally. An index left level 2 lymph node measures 2.4 x 2.0 cm, likely accounting for the palpable submandibular abnormality. Findings are highly suspicious for a lymphoproliferative process or metastatic nodal disease." -01/10/2019 Surgical Pathology Report ([id number]) revealed "Chronic lymphocytic leukemia/small lymphocytic lymphoma."  -01/10/2019 Flow Pathology ([id number]) revealed "Monoclonal B-cell population identified"   PLAN: -Discussed pt labwork today, 06/07/2020; blood counts holding steady. Other labs pending. -Recommended pt receive the second COVID booster shot as recently approved. Advised pt to wait 4-6 months following first booster shot before getting this. -Discussed [place] and pt's eligibility. Will send referral. Advised pt to wait around one month after this before getting the second booster shot. -No lab or clinical evidence to suggest significant CLL/SLL progression requiring treatment at this time. -Advised pt we can consolidate bloodwork with her PCP if received within the same month. Recommended pt receive labs every 6 months. -Will see back in 12 months with labs. Pt is aware to contact if anything changes prior. Pt has a visit with PCP in 6 month with labs.   FOLLOW UP: F/u with PCP Dr Virgina Jock with labs in 6 months as scheduled in nov 2022 RTC with Dr Irene Limbo with labs in 12 months   The total time spent in the appt was 20 minutes and more than 50% was on counseling and direct patient cares.  All of the patient's questions were answered with apparent satisfaction. The patient knows to call the clinic with any problems, questions or concerns.    Sullivan Lone MD [place] AAHIVMS  [facility]  (Office):       [phone] (Work cell):  [phone] (Fax):           [phone]  06/07/2020 12:25 PM  I, Reinaldo Raddle, am acting as scribe for Dr. Sullivan Lone, MD.    .I have reviewed the above documentation for accuracy and completeness, and I agree with the above. Brunetta Genera MD

## 2020-06-07 ENCOUNTER — Inpatient Hospital Stay (HOSPITAL_BASED_OUTPATIENT_CLINIC_OR_DEPARTMENT_OTHER): Payer: Medicare Other | Admitting: Hematology

## 2020-06-07 ENCOUNTER — Inpatient Hospital Stay: Payer: Medicare Other | Attending: Hematology

## 2020-06-07 ENCOUNTER — Other Ambulatory Visit: Payer: Self-pay

## 2020-06-07 VITALS — BP 148/80 | HR 114 | Temp 97.6°F | Resp 16 | Ht 65.0 in | Wt 102.3 lb

## 2020-06-07 DIAGNOSIS — C9110 Chronic lymphocytic leukemia of B-cell type not having achieved remission: Secondary | ICD-10-CM | POA: Diagnosis not present

## 2020-06-07 DIAGNOSIS — R296 Repeated falls: Secondary | ICD-10-CM | POA: Diagnosis not present

## 2020-06-07 LAB — CMP (CANCER CENTER ONLY)
ALT: 23 U/L (ref 0–44)
AST: 28 U/L (ref 15–41)
Albumin: 3.8 g/dL (ref 3.5–5.0)
Alkaline Phosphatase: 104 U/L (ref 38–126)
Anion gap: 8 (ref 5–15)
BUN: 22 mg/dL (ref 8–23)
CO2: 28 mmol/L (ref 22–32)
Calcium: 10.3 mg/dL (ref 8.9–10.3)
Chloride: 101 mmol/L (ref 98–111)
Creatinine: 0.89 mg/dL (ref 0.44–1.00)
GFR, Estimated: 60 mL/min — ABNORMAL LOW (ref >60)
Glucose, Bld: 97 mg/dL (ref 70–99)
Potassium: 4.2 mmol/L (ref 3.5–5.1)
Sodium: 137 mmol/L (ref 135–145)
Total Bilirubin: 0.4 mg/dL (ref 0.3–1.2)
Total Protein: 6.6 g/dL (ref 6.5–8.1)

## 2020-06-07 LAB — CBC WITH DIFFERENTIAL/PLATELET
Abs Immature Granulocytes: 0.02 10*3/uL (ref 0.00–0.07)
Basophils Absolute: 0.1 10*3/uL (ref 0.0–0.1)
Basophils Relative: 0 %
Eosinophils Absolute: 0.1 10*3/uL (ref 0.0–0.5)
Eosinophils Relative: 1 %
HCT: 32.7 % — ABNORMAL LOW (ref 36.0–46.0)
Hemoglobin: 11 g/dL — ABNORMAL LOW (ref 12.0–15.0)
Immature Granulocytes: 0 %
Lymphocytes Relative: 80 %
Lymphs Abs: 16.8 10*3/uL — ABNORMAL HIGH (ref 0.7–4.0)
MCH: 35.4 pg — ABNORMAL HIGH (ref 26.0–34.0)
MCHC: 33.6 g/dL (ref 30.0–36.0)
MCV: 105.1 fL — ABNORMAL HIGH (ref 80.0–100.0)
Monocytes Absolute: 1.5 10*3/uL — ABNORMAL HIGH (ref 0.1–1.0)
Monocytes Relative: 7 %
Neutro Abs: 2.5 10*3/uL (ref 1.7–7.7)
Neutrophils Relative %: 12 %
Platelets: 159 10*3/uL (ref 150–400)
RBC: 3.11 MIL/uL — ABNORMAL LOW (ref 3.87–5.11)
RDW: 14 % (ref 11.5–15.5)
WBC: 21 10*3/uL — ABNORMAL HIGH (ref 4.0–10.5)
nRBC: 0 % (ref 0.0–0.2)

## 2020-06-07 LAB — LACTATE DEHYDROGENASE: LDH: 203 U/L — ABNORMAL HIGH (ref 98–192)

## 2020-06-08 DIAGNOSIS — Z9181 History of falling: Secondary | ICD-10-CM | POA: Diagnosis not present

## 2020-06-08 DIAGNOSIS — M6281 Muscle weakness (generalized): Secondary | ICD-10-CM | POA: Diagnosis not present

## 2020-06-08 DIAGNOSIS — R2681 Unsteadiness on feet: Secondary | ICD-10-CM | POA: Diagnosis not present

## 2020-06-08 DIAGNOSIS — R2689 Other abnormalities of gait and mobility: Secondary | ICD-10-CM | POA: Diagnosis not present

## 2020-06-12 ENCOUNTER — Emergency Department (HOSPITAL_COMMUNITY): Payer: Medicare Other

## 2020-06-12 ENCOUNTER — Encounter (HOSPITAL_COMMUNITY): Payer: Self-pay | Admitting: Emergency Medicine

## 2020-06-12 ENCOUNTER — Emergency Department (HOSPITAL_COMMUNITY)
Admission: EM | Admit: 2020-06-12 | Discharge: 2020-06-12 | Disposition: A | Discharge status: Home / Self Care | Payer: Medicare Other | Attending: Emergency Medicine | Admitting: Emergency Medicine

## 2020-06-12 DIAGNOSIS — R58 Hemorrhage, not elsewhere classified: Secondary | ICD-10-CM | POA: Diagnosis not present

## 2020-06-12 DIAGNOSIS — S0003XA Contusion of scalp, initial encounter: Secondary | ICD-10-CM | POA: Diagnosis not present

## 2020-06-12 DIAGNOSIS — Z9181 History of falling: Secondary | ICD-10-CM | POA: Diagnosis not present

## 2020-06-12 DIAGNOSIS — M6281 Muscle weakness (generalized): Secondary | ICD-10-CM | POA: Diagnosis not present

## 2020-06-12 DIAGNOSIS — R2689 Other abnormalities of gait and mobility: Secondary | ICD-10-CM | POA: Diagnosis not present

## 2020-06-12 DIAGNOSIS — S0101XA Laceration without foreign body of scalp, initial encounter: Secondary | ICD-10-CM | POA: Insufficient documentation

## 2020-06-12 DIAGNOSIS — R0902 Hypoxemia: Secondary | ICD-10-CM | POA: Diagnosis not present

## 2020-06-12 DIAGNOSIS — W01198A Fall on same level from slipping, tripping and stumbling with subsequent striking against other object, initial encounter: Secondary | ICD-10-CM | POA: Diagnosis not present

## 2020-06-12 DIAGNOSIS — M47812 Spondylosis without myelopathy or radiculopathy, cervical region: Secondary | ICD-10-CM | POA: Diagnosis not present

## 2020-06-12 DIAGNOSIS — R9082 White matter disease, unspecified: Secondary | ICD-10-CM | POA: Diagnosis not present

## 2020-06-12 DIAGNOSIS — Y92009 Unspecified place in unspecified non-institutional (private) residence as the place of occurrence of the external cause: Secondary | ICD-10-CM | POA: Insufficient documentation

## 2020-06-12 DIAGNOSIS — I6529 Occlusion and stenosis of unspecified carotid artery: Secondary | ICD-10-CM | POA: Diagnosis not present

## 2020-06-12 DIAGNOSIS — G9389 Other specified disorders of brain: Secondary | ICD-10-CM | POA: Diagnosis not present

## 2020-06-12 DIAGNOSIS — S0990XA Unspecified injury of head, initial encounter: Secondary | ICD-10-CM

## 2020-06-12 DIAGNOSIS — R2681 Unsteadiness on feet: Secondary | ICD-10-CM | POA: Diagnosis not present

## 2020-06-12 DIAGNOSIS — Z79899 Other long term (current) drug therapy: Secondary | ICD-10-CM | POA: Insufficient documentation

## 2020-06-12 DIAGNOSIS — W19XXXA Unspecified fall, initial encounter: Secondary | ICD-10-CM | POA: Diagnosis not present

## 2020-06-12 DIAGNOSIS — G319 Degenerative disease of nervous system, unspecified: Secondary | ICD-10-CM | POA: Diagnosis not present

## 2020-06-12 DIAGNOSIS — I10 Essential (primary) hypertension: Secondary | ICD-10-CM | POA: Diagnosis not present

## 2020-06-12 DIAGNOSIS — S199XXA Unspecified injury of neck, initial encounter: Secondary | ICD-10-CM | POA: Diagnosis not present

## 2020-06-12 MED ORDER — LIDOCAINE-EPINEPHRINE 2 %-1:100000 IJ SOLN
20.0000 mL | Freq: Once | INTRAMUSCULAR | Status: AC
  Administered 2020-06-12: 20 mL
  Filled 2020-06-12: qty 1

## 2020-06-12 NOTE — ED Triage Notes (Signed)
Pt presents via [facility] from [facility] [phone]) after unwitnessed mechanical fall. Head trauma. No blood thinners or loss of consciousness. 1/2 in lac noted. Bleeding controlled. Alert and oriented.

## 2020-06-12 NOTE — Discharge Instructions (Signed)
Follow-up with primary doctor in 1 week for staple removal.  You can wash your hair but avoid vigorous scrubbing.

## 2020-06-12 NOTE — ED Provider Notes (Signed)
[place] DEPT Provider Note   CSN: [id number] Arrival date & time: 06/12/20  1427     History Chief Complaint  Patient presents with  . Fall  . Head Injury    FONNIE CROOKSHANKS is a 87 y.o. female.  Presented to ER with concern for fall.  Patient reports that she fell earlier today at her home.  Struck her head.  She denies pain anywhere at present.  Denies loss of consciousness, denies feeling lightheaded or dizzy.  She is not on blood thinners.  HPI     Past Medical History:  Diagnosis Date  . Chronic cough   . Elevated cholesterol   . GERD (gastroesophageal reflux disease)   . Hearing loss    left hearing aid  . Hyperlipidemia   . Hypertension   . Lymphoma ([place])   . Osteoporosis     Patient Active Problem List   Diagnosis Date Noted  . CLL (chronic lymphocytic leukemia) ([place]) 02/02/2019  . Dizziness 03/23/2018  . Allergic rhinitis 11/10/2017  . Eustachian tube dysfunction, unspecified laterality 11/10/2017  . Obstructive bronchiectasis ([place]) 07/08/2017  . Chronic cough 07/01/2017    Past Surgical History:  Procedure Laterality Date  . CATARACT EXTRACTION  2011  . LYMPH NODE BIOPSY Left 01/10/2019   Procedure: EXCISIONAL BIOPSY OF CERVICAL LYMPH NODE/LEFT;  Surgeon: Izora Gala, MD;  Location: [place];  Service: ENT;  Laterality: Left;     OB History    Gravida  0   Para  0   Term  0   Preterm  0   AB  0   Living        SAB  0   IAB  0   Ectopic  0   Multiple      Live Births              History reviewed. No pertinent family history.  Social History   Tobacco Use  . Smoking status: Never Smoker  . Smokeless tobacco: Never Used  Substance Use Topics  . Alcohol use: No  . Drug use: No    Home Medications Prior to Admission medications   Medication Sig Start Date End Date Taking? Authorizing Provider  amLODipine (NORVASC) 10 MG tablet Take 10 mg by mouth daily.    [provider]  aspirin 81 MG tablet Take 81 mg by mouth daily. Patient not taking: Reported on 06/07/2020    [provider]  atorvastatin (LIPITOR) 20 MG tablet Take 20 mg by mouth daily.    [provider]  Calcium Carbonate-Vitamin D (CALTRATE 600+D) 600-400 MG-UNIT per tablet Take 1 tablet by mouth daily.    [provider]  cholecalciferol (VITAMIN D) 1000 UNITS tablet Take 1,000 Units by mouth daily.    [provider]  Clotrimazole 1 % OINT Apply 1 application topically 2 (two) times daily. To fungal rash over chest wall and under breast 12/15/19   Brunetta Genera, MD  denosumab (PROLIA) 60 MG/ML SOLN injection Inject 60 mg into the skin every 6 (six) months. Given in May and Nov. - Administer in upper arm, thigh, or abdomen    [provider]  fluticasone (FLONASE) 50 MCG/ACT nasal spray Place 2 sprays into both nostrils 2 (two) times daily. 11/10/17   Lauraine Rinne, NP  latanoprost (XALATAN) 0.005 % ophthalmic solution Place 1 drop into both eyes at bedtime.  11/10/18   [provider]  Multiple Vitamin (MULTIVITAMIN  WITH MINERALS) TABS Take 1 tablet by mouth daily.    [provider]  Multiple Vitamins-Minerals (PRESERVISION AREDS 2) CAPS Take 1 capsule by mouth daily.     [provider]  omeprazole (PRILOSEC) 20 MG capsule Take 1 capsule (20 mg total) by mouth daily. 11/10/17   Lauraine Rinne, NP  OVER THE COUNTER MEDICATION Take 1 tablet by mouth daily. Called mucus relief, generic of mucinex dm    [provider]  Respiratory Therapy Supplies (FLUTTER) DEVI Use as directed Patient not taking: Reported on 10/24/2019 07/01/17   Tanda Rockers, MD  silver sulfADIAZINE (SILVADENE) 1 % cream Apply 1 application topically 2 (two) times daily. Patient not taking: Reported on 06/07/2020 05/18/20   Hughie Closs, PA-C  sodium chloride HYPERTONIC 3 % nebulizer solution Take two times daily prior to Flutter  Valve use 11/15/19   Tanda Rockers, MD    Allergies    Patient has no known allergies.  Review of Systems   Review of Systems  Constitutional: Positive for fatigue. Negative for chills and fever.  HENT: Negative for ear pain and sore throat.   Eyes: Negative for pain and visual disturbance.  Respiratory: Negative for cough and shortness of breath.   Cardiovascular: Negative for chest pain and palpitations.  Gastrointestinal: Negative for abdominal pain and vomiting.  Genitourinary: Negative for dysuria and hematuria.  Musculoskeletal: Negative for arthralgias and back pain.  Skin: Negative for color change and rash.  Neurological: Negative for seizures and syncope.  All other systems reviewed and are negative.   Physical Exam Updated Vital Signs BP (!) 175/93   Pulse 90   Temp 98 F (36.7 C) (Oral)   Resp 16   SpO2 100%   Physical Exam Vitals and nursing note reviewed.  Constitutional:      General: She is not in acute distress.    Appearance: She is well-developed.  HENT:     Head: Normocephalic.     Comments: 1 cm laceration to posterior occiput, no active bleeding Eyes:     Conjunctiva/sclera: Conjunctivae normal.  Cardiovascular:     Rate and Rhythm: Normal rate and regular rhythm.     Heart sounds: No murmur heard.   Pulmonary:     Effort: Pulmonary effort is normal. No respiratory distress.     Breath sounds: Normal breath sounds.  Abdominal:     Palpations: Abdomen is soft.     Tenderness: There is no abdominal tenderness.  Musculoskeletal:     Cervical back: Neck supple.  Skin:    General: Skin is warm and dry.  Neurological:     Mental Status: She is alert.     ED Results / Procedures / Treatments   Labs (all labs ordered are listed, but only abnormal results are displayed) Labs Reviewed - No data to display  EKG None  Radiology CT Head Wo Contrast  Result Date: 06/12/2020 CLINICAL DATA:  Unwitnessed fall. EXAM: CT HEAD WITHOUT  CONTRAST CT CERVICAL SPINE WITHOUT CONTRAST TECHNIQUE: Multidetector CT imaging of the head and cervical spine was performed following the standard protocol without intravenous contrast. Multiplanar CT image reconstructions of the cervical spine were also generated. COMPARISON:  Head CT 06/23/2019 FINDINGS: CT HEAD FINDINGS Brain: Stable age related cerebral atrophy, ventriculomegaly and periventricular white matter disease. No extra-axial fluid collections are identified. No CT findings for acute hemispheric infarction or intracranial hemorrhage. No mass lesions. The brainstem and cerebellum are normal. Vascular: Stable advanced vascular calcifications. No  aneurysm hyperdense vessels. Skull: No skull fracture or bone lesions. Sinuses/Orbits: The paranasal sinuses and mastoid air cells are clear. The globes are intact. Other: There is a left occipital scalp hematoma but no underlying fracture or radiopaque foreign body. CT CERVICAL SPINE FINDINGS Alignment: Stable advanced degenerative cervical spondylosis with multilevel disc disease and facet disease. Mild multilevel degenerative subluxations but the overall alignment is maintained. Skull base and vertebrae: Advanced degenerative changes at the skull base appears stable. No acute cervical spine fracture. No facet or laminar fractures. Soft tissues and spinal canal: No prevertebral fluid or swelling. No visible canal hematoma. Disc levels: The spinal canal is fairly generous. No significant spinal stenosis. No canal hematoma or spinal cord compromise. Upper chest: The lung apices are grossly clear. A few calcified granulomas are noted. Other: No neck masses or adenopathy. Advanced carotid artery calcifications. IMPRESSION: 1. Stable age related cerebral atrophy, ventriculomegaly and periventricular white matter disease. 2. No acute intracranial findings or skull fracture. 3. Left-sided occipital scalp hematoma. 4. Stable advanced degenerative cervical spondylosis  with multilevel disc disease and facet disease but no acute cervical spine fracture. Electronically Signed   By: Marijo Sanes M.D.   On: 06/12/2020 16:24   CT Cervical Spine Wo Contrast  Result Date: 06/12/2020 CLINICAL DATA:  Unwitnessed fall. EXAM: CT HEAD WITHOUT CONTRAST CT CERVICAL SPINE WITHOUT CONTRAST TECHNIQUE: Multidetector CT imaging of the head and cervical spine was performed following the standard protocol without intravenous contrast. Multiplanar CT image reconstructions of the cervical spine were also generated. COMPARISON:  Head CT 06/23/2019 FINDINGS: CT HEAD FINDINGS Brain: Stable age related cerebral atrophy, ventriculomegaly and periventricular white matter disease. No extra-axial fluid collections are identified. No CT findings for acute hemispheric infarction or intracranial hemorrhage. No mass lesions. The brainstem and cerebellum are normal. Vascular: Stable advanced vascular calcifications. No aneurysm hyperdense vessels. Skull: No skull fracture or bone lesions. Sinuses/Orbits: The paranasal sinuses and mastoid air cells are clear. The globes are intact. Other: There is a left occipital scalp hematoma but no underlying fracture or radiopaque foreign body. CT CERVICAL SPINE FINDINGS Alignment: Stable advanced degenerative cervical spondylosis with multilevel disc disease and facet disease. Mild multilevel degenerative subluxations but the overall alignment is maintained. Skull base and vertebrae: Advanced degenerative changes at the skull base appears stable. No acute cervical spine fracture. No facet or laminar fractures. Soft tissues and spinal canal: No prevertebral fluid or swelling. No visible canal hematoma. Disc levels: The spinal canal is fairly generous. No significant spinal stenosis. No canal hematoma or spinal cord compromise. Upper chest: The lung apices are grossly clear. A few calcified granulomas are noted. Other: No neck masses or adenopathy. Advanced carotid artery  calcifications. IMPRESSION: 1. Stable age related cerebral atrophy, ventriculomegaly and periventricular white matter disease. 2. No acute intracranial findings or skull fracture. 3. Left-sided occipital scalp hematoma. 4. Stable advanced degenerative cervical spondylosis with multilevel disc disease and facet disease but no acute cervical spine fracture. Electronically Signed   By: Marijo Sanes M.D.   On: 06/12/2020 16:24    Procedures Procedures   Medications Ordered in ED Medications  lidocaine-EPINEPHrine (XYLOCAINE W/EPI) 2 %-1:100000 (with pres) injection 20 mL (20 mLs Infiltration Given 06/12/20 1530)    ED Course  I have reviewed the triage vital signs and the nursing notes.  Pertinent labs & imaging results that were available during my care of the patient were reviewed by me and considered in my medical decision making (see chart for details).  MDM Rules/Calculators/A&P                         87 year old lady presented to ER after mechanical fall and head trauma.  Only trauma identified on exam was a small laceration to the top of her occiput.  Repaired with 1 staple.  CT negative.  Discharged back to her facility.    After the discussed management above, the patient was determined to be safe for discharge.  The patient was in agreement with this plan and all questions regarding their care were answered.  ED return precautions were discussed and the patient will return to the ED with any significant worsening of condition.    Final Clinical Impression(s) / ED Diagnoses Final diagnoses:  Injury of head, initial encounter    Rx / DC Orders ED Discharge Orders    None       Lucrezia Starch, MD 06/12/20 1640

## 2020-06-14 ENCOUNTER — Other Ambulatory Visit (HOSPITAL_COMMUNITY): Payer: Self-pay

## 2020-06-15 ENCOUNTER — Encounter (HOSPITAL_COMMUNITY)
Admission: RE | Admit: 2020-06-15 | Discharge: 2020-06-15 | Disposition: A | Discharge status: Home / Self Care | Payer: Medicare Other | Source: Ambulatory Visit | Attending: Internal Medicine | Admitting: Internal Medicine

## 2020-06-15 DIAGNOSIS — M810 Age-related osteoporosis without current pathological fracture: Secondary | ICD-10-CM | POA: Diagnosis not present

## 2020-06-15 MED ORDER — DENOSUMAB 60 MG/ML ~~LOC~~ SOSY
PREFILLED_SYRINGE | SUBCUTANEOUS | Status: AC
  Administered 2020-06-15: 60 mg via SUBCUTANEOUS
  Filled 2020-06-15: qty 1

## 2020-06-15 MED ORDER — DENOSUMAB 60 MG/ML ~~LOC~~ SOSY: 60.0000 mg | PREFILLED_SYRINGE | Freq: Once | SUBCUTANEOUS | Status: AC

## 2020-06-17 ENCOUNTER — Telehealth: Payer: Self-pay | Admitting: Adult Health

## 2020-06-17 NOTE — Telephone Encounter (Signed)
I called patient to discuss Evusheld, a long acting monoclonal antibody injection administered to patients with decreased immune systems or intolerance/allergy to the COVID 19 vaccine as COVID19 prevention.    I spoke with Mary Kim's daughter, Mary Kim about the injection in detail.  She would like ot f/u with Dr. Virgina Jock as scheduled on Wednesday to discuss further and will call me back if her mom would like to receive it.    Wilber Bihari, NP

## 2020-06-20 DIAGNOSIS — R269 Unspecified abnormalities of gait and mobility: Secondary | ICD-10-CM | POA: Diagnosis not present

## 2020-06-20 DIAGNOSIS — S0101XA Laceration without foreign body of scalp, initial encounter: Secondary | ICD-10-CM | POA: Diagnosis not present

## 2020-06-20 DIAGNOSIS — Z9181 History of falling: Secondary | ICD-10-CM | POA: Diagnosis not present

## 2020-06-20 DIAGNOSIS — Z4802 Encounter for removal of sutures: Secondary | ICD-10-CM | POA: Diagnosis not present

## 2020-06-21 DIAGNOSIS — Z9181 History of falling: Secondary | ICD-10-CM | POA: Diagnosis not present

## 2020-06-21 DIAGNOSIS — R2689 Other abnormalities of gait and mobility: Secondary | ICD-10-CM | POA: Diagnosis not present

## 2020-06-21 DIAGNOSIS — M6281 Muscle weakness (generalized): Secondary | ICD-10-CM | POA: Diagnosis not present

## 2020-06-21 DIAGNOSIS — R2681 Unsteadiness on feet: Secondary | ICD-10-CM | POA: Diagnosis not present

## 2020-07-03 DIAGNOSIS — R2689 Other abnormalities of gait and mobility: Secondary | ICD-10-CM | POA: Diagnosis not present

## 2020-07-03 DIAGNOSIS — R2681 Unsteadiness on feet: Secondary | ICD-10-CM | POA: Diagnosis not present

## 2020-07-03 DIAGNOSIS — Z9181 History of falling: Secondary | ICD-10-CM | POA: Diagnosis not present

## 2020-07-03 DIAGNOSIS — M6281 Muscle weakness (generalized): Secondary | ICD-10-CM | POA: Diagnosis not present

## 2020-07-05 DIAGNOSIS — M6281 Muscle weakness (generalized): Secondary | ICD-10-CM | POA: Diagnosis not present

## 2020-07-05 DIAGNOSIS — R2689 Other abnormalities of gait and mobility: Secondary | ICD-10-CM | POA: Diagnosis not present

## 2020-07-05 DIAGNOSIS — R2681 Unsteadiness on feet: Secondary | ICD-10-CM | POA: Diagnosis not present

## 2020-07-05 DIAGNOSIS — Z9181 History of falling: Secondary | ICD-10-CM | POA: Diagnosis not present

## 2020-07-10 DIAGNOSIS — R2681 Unsteadiness on feet: Secondary | ICD-10-CM | POA: Diagnosis not present

## 2020-07-10 DIAGNOSIS — M6281 Muscle weakness (generalized): Secondary | ICD-10-CM | POA: Diagnosis not present

## 2020-07-10 DIAGNOSIS — Z9181 History of falling: Secondary | ICD-10-CM | POA: Diagnosis not present

## 2020-07-10 DIAGNOSIS — R2689 Other abnormalities of gait and mobility: Secondary | ICD-10-CM | POA: Diagnosis not present

## 2020-07-12 DIAGNOSIS — R2681 Unsteadiness on feet: Secondary | ICD-10-CM | POA: Diagnosis not present

## 2020-07-12 DIAGNOSIS — M6281 Muscle weakness (generalized): Secondary | ICD-10-CM | POA: Diagnosis not present

## 2020-07-12 DIAGNOSIS — Z9181 History of falling: Secondary | ICD-10-CM | POA: Diagnosis not present

## 2020-07-12 DIAGNOSIS — R2689 Other abnormalities of gait and mobility: Secondary | ICD-10-CM | POA: Diagnosis not present

## 2020-07-28 ENCOUNTER — Other Ambulatory Visit: Payer: Self-pay

## 2020-07-28 ENCOUNTER — Encounter (HOSPITAL_COMMUNITY): Payer: Self-pay | Admitting: *Deleted

## 2020-07-28 ENCOUNTER — Ambulatory Visit (HOSPITAL_COMMUNITY)
Admission: EM | Admit: 2020-07-28 | Discharge: 2020-07-28 | Disposition: A | Discharge status: Home / Self Care | Payer: Medicare Other | Attending: Family Medicine | Admitting: Family Medicine

## 2020-07-28 DIAGNOSIS — J069 Acute upper respiratory infection, unspecified: Secondary | ICD-10-CM

## 2020-07-28 MED ORDER — GUAIFENESIN ER 600 MG PO TB12: 600.0000 mg | ORAL_TABLET | Freq: Two times a day (BID) | ORAL | 0 refills | Status: AC

## 2020-07-28 MED ORDER — AZITHROMYCIN 250 MG PO TABS: ORAL_TABLET | ORAL | 0 refills | Status: DC

## 2020-07-28 NOTE — ED Provider Notes (Signed)
[facility]    CSN: [id number] Arrival date & time: 07/28/20  1026      History   Chief Complaint Chief Complaint  Patient presents with   Cough    HPI Mary Kim is a 87 y.o. female.   Patient presenting today with daughter for evaluation of almost a week of worsening productive cough of thick yellow sputum.  She denies fever, chills, chest pain, significant shortness of breath or wheezing, sore throat, congestion, headache, dizziness, syncope.  So far using her nebulizer treatments that she has at home for obstructive bronchiectasis which seems to help break up the mucus temporarily.  Daughter and daughter's boyfriend have had a cold all week and she thinks this is where she initially got symptoms.   Past Medical History:  Diagnosis Date   Chronic cough    Elevated cholesterol    GERD (gastroesophageal reflux disease)    Hearing loss    left hearing aid   Hyperlipidemia    Hypertension    Lymphoma ([place])    Osteoporosis     Patient Active Problem List   Diagnosis Date Noted   CLL (chronic lymphocytic leukemia) ([place]) 02/02/2019   Dizziness 03/23/2018   Allergic rhinitis 11/10/2017   Eustachian tube dysfunction, unspecified laterality 11/10/2017   Obstructive bronchiectasis ([place]) 07/08/2017   Chronic cough 07/01/2017    Past Surgical History:  Procedure Laterality Date   CATARACT EXTRACTION  2011   LYMPH NODE BIOPSY Left 01/10/2019   Procedure: EXCISIONAL BIOPSY OF CERVICAL LYMPH NODE/LEFT;  Surgeon: Izora Gala, MD;  Location: [place];  Service: ENT;  Laterality: Left;    OB History     Gravida  0   Para  0   Term  0   Preterm  0   AB  0   Living         SAB  0   IAB  0   Ectopic  0   Multiple      Live Births               Home Medications    Prior to Admission medications   Medication Sig Start Date End Date Taking? Authorizing Provider  azithromycin (ZITHROMAX) 250 MG tablet Take 2 tabs day one,  then 1 tab daily until complete 07/28/20  Yes Volney American, PA-C  guaiFENesin (MUCINEX) 600 MG 12 hr tablet Take 1 tablet (600 mg total) by mouth 2 (two) times daily. 07/28/20  Yes Volney American, PA-C  amLODipine (NORVASC) 10 MG tablet Take 10 mg by mouth daily.    [provider]  aspirin 81 MG tablet Take 81 mg by mouth daily. Patient not taking: Reported on 06/07/2020    [provider]  atorvastatin (LIPITOR) 20 MG tablet Take 20 mg by mouth daily.    [provider]  Calcium Carbonate-Vitamin D (CALTRATE 600+D) 600-400 MG-UNIT per tablet Take 1 tablet by mouth daily.    [provider]  cholecalciferol (VITAMIN D) 1000 UNITS tablet Take 1,000 Units by mouth daily.    [provider]  Clotrimazole 1 % OINT Apply 1 application topically 2 (two) times daily. To fungal rash over chest wall and under breast 12/15/19   Brunetta Genera, MD  denosumab (PROLIA) 60 MG/ML SOLN injection Inject 60 mg into the skin every 6 (six) months. Given in May and Nov. - Administer in upper arm, thigh, or abdomen    [provider]  fluticasone Asencion Islam)  50 MCG/ACT nasal spray Place 2 sprays into both nostrils 2 (two) times daily. 11/10/17   Lauraine Rinne, NP  latanoprost (XALATAN) 0.005 % ophthalmic solution Place 1 drop into both eyes at bedtime.  11/10/18   [provider]  Multiple Vitamin (MULTIVITAMIN WITH MINERALS) TABS Take 1 tablet by mouth daily.    [provider]  Multiple Vitamins-Minerals (PRESERVISION AREDS 2) CAPS Take 1 capsule by mouth daily.     [provider]  omeprazole (PRILOSEC) 20 MG capsule Take 1 capsule (20 mg total) by mouth daily. 11/10/17   Lauraine Rinne, NP  OVER THE COUNTER MEDICATION Take 1 tablet by mouth daily. Called mucus relief, generic of mucinex dm    [provider]  Respiratory Therapy Supplies (FLUTTER) DEVI Use as directed Patient not taking: Reported on 10/24/2019  07/01/17   Tanda Rockers, MD  silver sulfADIAZINE (SILVADENE) 1 % cream Apply 1 application topically 2 (two) times daily. Patient not taking: Reported on 06/07/2020 05/18/20   Hughie Closs, PA-C  sodium chloride HYPERTONIC 3 % nebulizer solution Take two times daily prior to Flutter Valve use 11/15/19   Tanda Rockers, MD    Family History History reviewed. No pertinent family history.  Social History Social History   Tobacco Use   Smoking status: Never   Smokeless tobacco: Never  Substance Use Topics   Alcohol use: No   Drug use: No     Allergies   Patient has no known allergies.   Review of Systems Review of Systems Per HPI  Physical Exam Triage Vital Signs ED Triage Vitals  Enc Vitals Group     BP 07/28/20 1134 (!) 164/85     Pulse Rate 07/28/20 1134 99     Resp 07/28/20 1134 20     Temp --      Temp src --      SpO2 07/28/20 1134 97 %     Weight --      Height --      Head Circumference --      Peak Flow --      Pain Score 07/28/20 1130 0     Pain Loc --      Pain Edu? --      Excl. in [place]? --    No data found.  Updated Vital Signs BP (!) 164/85   Pulse 99   Resp 20   SpO2 97%   Visual Acuity Right Eye Distance:   Left Eye Distance:   Bilateral Distance:    Right Eye Near:   Left Eye Near:    Bilateral Near:     Physical Exam Vitals and nursing note reviewed.  Constitutional:      Appearance: Normal appearance. She is not ill-appearing.  HENT:     Head: Atraumatic.     Right Ear: Tympanic membrane normal.     Left Ear: Tympanic membrane normal.     Nose: Nose normal.     Mouth/Throat:     Mouth: Mucous membranes are moist.     Pharynx: Oropharynx is clear.  Eyes:     Extraocular Movements: Extraocular movements intact.     Conjunctiva/sclera: Conjunctivae normal.  Cardiovascular:     Rate and Rhythm: Normal rate and regular rhythm.     Heart sounds: Normal heart sounds.  Pulmonary:     Effort: Pulmonary effort is normal.      Breath sounds: Normal breath sounds. No wheezing or rales.  Abdominal:  General: Bowel sounds are normal.     Palpations: Abdomen is soft.  Musculoskeletal:        General: Normal range of motion.     Cervical back: Normal range of motion and neck supple.  Skin:    General: Skin is warm and dry.  Neurological:     Mental Status: She is alert and oriented to person, place, and time.  Psychiatric:        Mood and Affect: Mood normal.        Thought Content: Thought content normal.        Judgment: Judgment normal.   UC Treatments / Results  Labs (all labs ordered are listed, but only abnormal results are displayed) Labs Reviewed - No data to display  EKG   Radiology No results found.  Procedures Procedures (including critical care time)  Medications Ordered in UC Medications - No data to display  Initial Impression / Assessment and Plan / UC Course  I have reviewed the triage vital signs and the nursing notes.  Pertinent labs & imaging results that were available during my care of the patient were reviewed by me and considered in my medical decision making (see chart for details).     Worsening course for almost 1 week and with multiple risk factors for pneumonia.  Will cover for this with azithromycin though no evidence today of progression into pneumonia.  Mucinex, nebulizer treatments as before, strict return precautions given for acutely worsening symptoms.  PCP recheck next week recommended.  Final Clinical Impressions(s) / UC Diagnoses   Final diagnoses:  Upper respiratory tract infection, unspecified type   Discharge Instructions   None    ED Prescriptions     Medication Sig Dispense Auth. Provider   azithromycin (ZITHROMAX) 250 MG tablet Take 2 tabs day one, then 1 tab daily until complete 6 tablet Volney American, PA-C   guaiFENesin (MUCINEX) 600 MG 12 hr tablet Take 1 tablet (600 mg total) by mouth 2 (two) times daily. 30 tablet Volney American, [place]      PDMP not reviewed this encounter.   Volney American, [place] 07/28/20 1217

## 2020-07-28 NOTE — ED Triage Notes (Signed)
Family reports 4 days of cough

## 2020-08-01 DIAGNOSIS — I129 Hypertensive chronic kidney disease with stage 1 through stage 4 chronic kidney disease, or unspecified chronic kidney disease: Secondary | ICD-10-CM | POA: Diagnosis not present

## 2020-08-01 DIAGNOSIS — N1831 Chronic kidney disease, stage 3a: Secondary | ICD-10-CM | POA: Diagnosis not present

## 2020-08-01 DIAGNOSIS — J069 Acute upper respiratory infection, unspecified: Secondary | ICD-10-CM | POA: Diagnosis not present

## 2020-08-01 DIAGNOSIS — R051 Acute cough: Secondary | ICD-10-CM | POA: Diagnosis not present

## 2020-08-04 ENCOUNTER — Other Ambulatory Visit: Payer: Self-pay

## 2020-08-04 ENCOUNTER — Emergency Department (HOSPITAL_COMMUNITY)
Admission: EM | Admit: 2020-08-04 | Discharge: 2020-08-04 | Disposition: A | Discharge status: Home / Self Care | Payer: Medicare Other | Attending: Emergency Medicine | Admitting: Emergency Medicine

## 2020-08-04 ENCOUNTER — Emergency Department (HOSPITAL_COMMUNITY): Payer: Medicare Other

## 2020-08-04 ENCOUNTER — Encounter (HOSPITAL_COMMUNITY): Payer: Self-pay

## 2020-08-04 DIAGNOSIS — I10 Essential (primary) hypertension: Secondary | ICD-10-CM | POA: Insufficient documentation

## 2020-08-04 DIAGNOSIS — R0602 Shortness of breath: Secondary | ICD-10-CM | POA: Diagnosis present

## 2020-08-04 DIAGNOSIS — J439 Emphysema, unspecified: Secondary | ICD-10-CM | POA: Diagnosis not present

## 2020-08-04 DIAGNOSIS — Z79899 Other long term (current) drug therapy: Secondary | ICD-10-CM | POA: Insufficient documentation

## 2020-08-04 DIAGNOSIS — U071 COVID-19: Secondary | ICD-10-CM | POA: Insufficient documentation

## 2020-08-04 DIAGNOSIS — R059 Cough, unspecified: Secondary | ICD-10-CM | POA: Diagnosis not present

## 2020-08-04 LAB — URINALYSIS, ROUTINE W REFLEX MICROSCOPIC
Bilirubin Urine: NEGATIVE
Glucose, UA: NEGATIVE mg/dL
Hgb urine dipstick: NEGATIVE
Ketones, ur: NEGATIVE mg/dL
Leukocytes,Ua: NEGATIVE
Nitrite: NEGATIVE
Protein, ur: NEGATIVE mg/dL
Specific Gravity, Urine: 1.008 (ref 1.005–1.030)
pH: 6 (ref 5.0–8.0)

## 2020-08-04 LAB — RESP PANEL BY RT-PCR (FLU A&B, COVID) ARPGX2
Influenza A by PCR: NEGATIVE
Influenza B by PCR: NEGATIVE
SARS Coronavirus 2 by RT PCR: POSITIVE — AB

## 2020-08-04 LAB — CBC WITH DIFFERENTIAL/PLATELET
Abs Immature Granulocytes: 0.06 10*3/uL (ref 0.00–0.07)
Basophils Absolute: 0 10*3/uL (ref 0.0–0.1)
Basophils Relative: 0 %
Eosinophils Absolute: 0 10*3/uL (ref 0.0–0.5)
Eosinophils Relative: 0 %
HCT: 31.8 % — ABNORMAL LOW (ref 36.0–46.0)
Hemoglobin: 10.6 g/dL — ABNORMAL LOW (ref 12.0–15.0)
Immature Granulocytes: 0 %
Lymphocytes Relative: 62 %
Lymphs Abs: 10.9 10*3/uL — ABNORMAL HIGH (ref 0.7–4.0)
MCH: 34.4 pg — ABNORMAL HIGH (ref 26.0–34.0)
MCHC: 33.3 g/dL (ref 30.0–36.0)
MCV: 103.2 fL — ABNORMAL HIGH (ref 80.0–100.0)
Monocytes Absolute: 1.8 10*3/uL — ABNORMAL HIGH (ref 0.1–1.0)
Monocytes Relative: 10 %
Neutro Abs: 5 10*3/uL (ref 1.7–7.7)
Neutrophils Relative %: 28 %
Platelets: 296 10*3/uL (ref 150–400)
RBC: 3.08 MIL/uL — ABNORMAL LOW (ref 3.87–5.11)
RDW: 13.8 % (ref 11.5–15.5)
WBC Morphology: ABNORMAL
WBC: 17.8 10*3/uL — ABNORMAL HIGH (ref 4.0–10.5)
nRBC: 0 % (ref 0.0–0.2)

## 2020-08-04 LAB — COMPREHENSIVE METABOLIC PANEL
ALT: 26 U/L (ref 0–44)
AST: 26 U/L (ref 15–41)
Albumin: 3.6 g/dL (ref 3.5–5.0)
Alkaline Phosphatase: 92 U/L (ref 38–126)
Anion gap: 9 (ref 5–15)
BUN: 31 mg/dL — ABNORMAL HIGH (ref 8–23)
CO2: 25 mmol/L (ref 22–32)
Calcium: 9.5 mg/dL (ref 8.9–10.3)
Chloride: 97 mmol/L — ABNORMAL LOW (ref 98–111)
Creatinine, Ser: 0.84 mg/dL (ref 0.44–1.00)
GFR, Estimated: >60 mL/min (ref >60)
Glucose, Bld: 103 mg/dL — ABNORMAL HIGH (ref 70–99)
Potassium: 5 mmol/L (ref 3.5–5.1)
Sodium: 131 mmol/L — ABNORMAL LOW (ref 135–145)
Total Bilirubin: 0.7 mg/dL (ref 0.3–1.2)
Total Protein: 7 g/dL (ref 6.5–8.1)

## 2020-08-04 LAB — BRAIN NATRIURETIC PEPTIDE: B Natriuretic Peptide: 57.9 pg/mL (ref 0.0–100.0)

## 2020-08-04 MED ORDER — FAMOTIDINE IN NACL 20-0.9 MG/50ML-% IV SOLN: 20.0000 mg | Freq: Once | INTRAVENOUS | Status: DC | PRN

## 2020-08-04 MED ORDER — EPINEPHRINE 0.3 MG/0.3ML IJ SOAJ: 0.3000 mg | Freq: Once | INTRAMUSCULAR | Status: DC | PRN

## 2020-08-04 MED ORDER — METHYLPREDNISOLONE SODIUM SUCC 125 MG IJ SOLR: 125.0000 mg | Freq: Once | INTRAMUSCULAR | Status: DC | PRN

## 2020-08-04 MED ORDER — AMLODIPINE BESYLATE 5 MG PO TABS
10.0000 mg | ORAL_TABLET | Freq: Once | ORAL | Status: AC
  Administered 2020-08-04: 10 mg via ORAL
  Filled 2020-08-04: qty 2

## 2020-08-04 MED ORDER — SODIUM CHLORIDE 0.9 % IV SOLN: INTRAVENOUS | Status: DC | PRN

## 2020-08-04 MED ORDER — BEBTELOVIMAB 175 MG/2 ML IV (EUA)
175.0000 mg | Freq: Once | INTRAMUSCULAR | Status: AC
  Administered 2020-08-04: 175 mg via INTRAVENOUS
  Filled 2020-08-04: qty 2

## 2020-08-04 MED ORDER — DIPHENHYDRAMINE HCL 50 MG/ML IJ SOLN: 50.0000 mg | Freq: Once | INTRAMUSCULAR | Status: DC | PRN

## 2020-08-04 MED ORDER — ALBUTEROL SULFATE HFA 108 (90 BASE) MCG/ACT IN AERS: 2.0000 | INHALATION_SPRAY | Freq: Once | RESPIRATORY_TRACT | Status: DC | PRN

## 2020-08-04 NOTE — ED Triage Notes (Signed)
Pt BIB EMS. Pt is coming from a nursing home "[facility]". Pt c/o cough for the last two weeks. Pt has been seen at urgent care, given two rounds of abx with no relief. Pt did get x ray but has not got results back. Per pts daughter pt has never been tested for covid. Pt states that the cough is making her weak and unable to sleep at night.

## 2020-08-04 NOTE — Discharge Instructions (Addendum)
As discussed, your evaluation today has been largely reassuring.  But, it is important that you monitor your condition carefully, and do not hesitate to return to the ED if you develop new, or concerning changes in your condition. ? ?Otherwise, please follow-up with your physician for appropriate ongoing care. ? ?

## 2020-08-04 NOTE — ED Provider Notes (Signed)
[facility] DEPT Provider Note   CSN: [id number] Arrival date & time: 08/04/20  1604     History Chief Complaint  Patient presents with   Cough    Mary Kim is a 87 y.o. female.  HPI Patient presents from nursing facility with concern for cough.  Patient has had 2 rounds of antibiotics, without change in her condition.  She was seen and evaluated urgent care 2 days ago, had x-ray, labs, but is unaware of the results.  During this illness she has not been tested for COVID. The patient denies pain, fever, nausea, weakness, do state that the cough is uncomfortable, and makes sleeping difficult, however.     Past Medical History:  Diagnosis Date   Chronic cough    Elevated cholesterol    GERD (gastroesophageal reflux disease)    Hearing loss    left hearing aid   Hyperlipidemia    Hypertension    Lymphoma ([place])    Osteoporosis     Patient Active Problem List   Diagnosis Date Noted   CLL (chronic lymphocytic leukemia) ([place]) 02/02/2019   Dizziness 03/23/2018   Allergic rhinitis 11/10/2017   Eustachian tube dysfunction, unspecified laterality 11/10/2017   Obstructive bronchiectasis ([place]) 07/08/2017   Chronic cough 07/01/2017    Past Surgical History:  Procedure Laterality Date   CATARACT EXTRACTION  2011   LYMPH NODE BIOPSY Left 01/10/2019   Procedure: EXCISIONAL BIOPSY OF CERVICAL LYMPH NODE/LEFT;  Surgeon: Izora Gala, MD;  Location: [facility];  Service: ENT;  Laterality: Left;     OB History     Gravida  0   Para  0   Term  0   Preterm  0   AB  0   Living         SAB  0   IAB  0   Ectopic  0   Multiple      Live Births              History reviewed. No pertinent family history.  Social History   Tobacco Use   Smoking status: Never   Smokeless tobacco: Never  Substance Use Topics   Alcohol use: No   Drug use: No    Home Medications Prior to Admission medications   Medication  Sig Start Date End Date Taking? Authorizing Provider  amLODipine (NORVASC) 5 MG tablet Take 5 mg by mouth every morning. 07/01/20  Yes [provider]  Calcium Carbonate-Vitamin D 600-400 MG-UNIT tablet Take 1 tablet by mouth daily.   Yes [provider]  cholecalciferol (VITAMIN D) 1000 UNITS tablet Take 1,000 Units by mouth daily.   Yes [provider]  denosumab (PROLIA) 60 MG/ML SOLN injection Inject 60 mg into the skin every 6 (six) months. Administer in upper arm, thigh, or abdomen   Yes [provider]  fluticasone (FLONASE) 50 MCG/ACT nasal spray Place 2 sprays into both nostrils 2 (two) times daily. Patient taking differently: Place 2 sprays into both nostrils 2 (two) times daily as needed for allergies or rhinitis (congestion). 11/10/17  Yes Lauraine Rinne, NP  guaiFENesin (MUCINEX) 600 MG 12 hr tablet Take 1 tablet (600 mg total) by mouth 2 (two) times daily. 07/28/20  Yes Volney American, PA-C  latanoprost (XALATAN) 0.005 % ophthalmic solution Place 1 drop into both eyes at bedtime.  11/10/18  Yes [provider]  Multiple Vitamins-Minerals (PRESERVISION AREDS 2) CAPS Take 1 capsule by mouth 2 (two)  times daily.   Yes [provider]  omeprazole (PRILOSEC OTC) 20 MG tablet Take 20 mg by mouth daily.   Yes [provider]  Respiratory Therapy Supplies (FLUTTER) DEVI Use as directed 07/01/17  Yes Tanda Rockers, MD  sodium chloride HYPERTONIC 3 % nebulizer solution Take two times daily prior to Flutter Valve use Patient taking differently: Take 4 mLs by nebulization 2 (two) times daily. Take two times daily prior to Flutter Valve use 11/15/19  Yes Tanda Rockers, MD  azithromycin (ZITHROMAX) 250 MG tablet Take 2 tabs day one, then 1 tab daily until complete Patient taking differently: Take 250-500 mg by mouth See admin instructions. Take 2 tablets (500 mg) by mouth 1st day, then take 1 tablet (250 mg) daily on days 2-5 07/28/20    Volney American, PA-C  Clotrimazole 1 % OINT Apply 1 application topically 2 (two) times daily. To fungal rash over chest wall and under breast Patient not taking: No sig reported 12/15/19   Brunetta Genera, MD  silver sulfADIAZINE (SILVADENE) 1 % cream Apply 1 application topically 2 (two) times daily. Patient not taking: No sig reported 05/18/20   Hughie Closs, PA-C    Allergies    Patient has no known allergies.  Review of Systems   Review of Systems  Constitutional:        Per HPI, otherwise negative  HENT:         Per HPI, otherwise negative  Respiratory:         Per HPI, otherwise negative  Cardiovascular:        Per HPI, otherwise negative  Gastrointestinal:  Negative for vomiting.  Endocrine:       Negative aside from HPI  Genitourinary:        Neg aside from HPI   Musculoskeletal:        Per HPI, otherwise negative  Skin: Negative.   Neurological:  Negative for syncope.   Physical Exam Updated Vital Signs BP (!) 169/85   Pulse 92   Temp 98.3 F (36.8 C) (Oral)   Resp (!) 24   Ht 5\' 5"  (1.651 m)   Wt 46.4 kg   SpO2 96%   BMI 17.02 kg/m   Physical Exam Vitals and nursing note reviewed.  Constitutional:      Appearance: She is well-developed.  HENT:     Head: Normocephalic and atraumatic.  Eyes:     Conjunctiva/sclera: Conjunctivae normal.  Cardiovascular:     Rate and Rhythm: Normal rate and regular rhythm.     Pulses: Normal pulses.  Pulmonary:     Effort: Pulmonary effort is normal. No respiratory distress.     Breath sounds: Normal breath sounds. No stridor.  Abdominal:     General: There is no distension.  Skin:    General: Skin is warm and dry.  Neurological:     Mental Status: She is alert and oriented to person, place, and time.     Cranial Nerves: No cranial nerve deficit.    ED Results / Procedures / Treatments   Labs (all labs ordered are listed, but only abnormal results are displayed) Labs Reviewed  RESP PANEL  BY RT-PCR (FLU A&B, COVID) ARPGX2 - Abnormal; Notable for the following components:      Result Value   SARS Coronavirus 2 by RT PCR POSITIVE (*)    All other components within normal limits  COMPREHENSIVE METABOLIC PANEL - Abnormal; Notable for the following components:  Sodium 131 (*)    Chloride 97 (*)    Glucose, Bld 103 (*)    BUN 31 (*)    All other components within normal limits  CBC WITH DIFFERENTIAL/PLATELET - Abnormal; Notable for the following components:   WBC 17.8 (*)    RBC 3.08 (*)    Hemoglobin 10.6 (*)    HCT 31.8 (*)    MCV 103.2 (*)    MCH 34.4 (*)    Lymphs Abs 10.9 (*)    Monocytes Absolute 1.8 (*)    All other components within normal limits  URINALYSIS, ROUTINE W REFLEX MICROSCOPIC - Abnormal; Notable for the following components:   Color, Urine STRAW (*)    All other components within normal limits  BRAIN NATRIURETIC PEPTIDE    EKG EKG Interpretation  Date/Time:  Saturday August 04 2020 16:30:02 EDT Ventricular Rate:  93 PR Interval:  174 QRS Duration: 94 QT Interval:  332 QTC Calculation: 413 R Axis:   12 Text Interpretation: Sinus rhythm RSR' in V1 or V2, right VCD or RVH Otherwise within normal limits Confirmed by Carmin Muskrat [phone]) on 08/04/2020 5:58:29 PM  Radiology DG Chest Port 1 View  Result Date: 08/04/2020 CLINICAL DATA:  Cough for 2 weeks. EXAM: PORTABLE CHEST 1 VIEW COMPARISON:  Single-view of the chest 01/18/2020 FINDINGS: Lungs are emphysematous but clear. Heart size normal. Aortic atherosclerosis. No pneumothorax or pleural fluid. Healing left rib fractures noted. No acute abnormality. IMPRESSION: No acute disease. Aortic Atherosclerosis (ICD10-I70.0) and Emphysema (ICD10-J43.9). Electronically Signed   By: Inge Rise M.D.   On: 08/04/2020 18:10    Procedures Procedures   Medications Ordered in ED Medications  0.9 %  sodium chloride infusion (has no administration in time range)  diphenhydrAMINE (BENADRYL) injection 50 mg  (has no administration in time range)  famotidine (PEPCID) IVPB 20 mg premix (has no administration in time range)  methylPREDNISolone sodium succinate (SOLU-MEDROL) 125 mg/2 mL injection 125 mg (has no administration in time range)  albuterol (VENTOLIN HFA) 108 (90 Base) MCG/ACT inhaler 2 puff (has no administration in time range)  EPINEPHrine (EPI-PEN) injection 0.3 mg (has no administration in time range)  bebtelovimab EUA injection SOLN 175 mg (175 mg Intravenous Given 08/04/20 2007)  amLODipine (NORVASC) tablet 10 mg (10 mg Oral Given 08/04/20 1835)    ED Course  I have reviewed the triage vital signs and the nursing notes.  Pertinent labs & imaging results that were available during my care of the patient were reviewed by me and considered in my medical decision making (see chart for details).  Patient now committed by her daughter.  We discussed all findings, and patient is found to be COVID-positive.  She remains without evidence for respiratory compromise, now is feeling better, received meds here.  9:23 PM Patient has received monoclonal antibody Bebtelovimab, well-tolerated, states that she feels better.  Without evidence for complicated version of COVID, no pneumonia, no substantial hemodynamic instability, blood pressure has improved, and without potential lab abnormalities the patient is comfortable with, appropriate for discharge to her nursing facility. MDM Rules/Calculators/A&P Mary Kim was evaluated in Emergency Department on 08/04/2020 for the symptoms described in the history of present illness. She was evaluated in the context of the global COVID-19 pandemic, which necessitated consideration that the patient might be at risk for infection with the SARS-CoV-2 virus that causes COVID-19. Institutional protocols and algorithms that pertain to the evaluation of patients at risk for COVID-19 are in a state of rapid  change based on information released by regulatory bodies including  the CDC and federal and state organizations. These policies and algorithms were followed during the patient's care in the ED.  Final Clinical Impression(s) / ED Diagnoses Final diagnoses:  COVID    Rx / DC Orders ED Discharge Orders     None        Carmin Muskrat, MD 08/04/20 2124

## 2020-08-04 NOTE — ED Notes (Signed)
Patient discharged. [facility] called. No answer. Daughter is taking patient home POV. AVS reviewed. Family had no questions at this time.

## 2020-08-07 DIAGNOSIS — J189 Pneumonia, unspecified organism: Secondary | ICD-10-CM | POA: Diagnosis not present

## 2020-08-07 DIAGNOSIS — I10 Essential (primary) hypertension: Secondary | ICD-10-CM | POA: Diagnosis not present

## 2020-08-07 DIAGNOSIS — U071 COVID-19: Secondary | ICD-10-CM | POA: Diagnosis not present

## 2020-08-07 DIAGNOSIS — R1084 Generalized abdominal pain: Secondary | ICD-10-CM | POA: Diagnosis not present

## 2020-08-20 ENCOUNTER — Telehealth: Payer: Self-pay | Admitting: Internal Medicine

## 2020-08-20 NOTE — Telephone Encounter (Signed)
Called and spoke with patient's daughter [name]. She has been scheduled to see MW tomorrow at 945am.   Nothing further needed at time of call.

## 2020-08-20 NOTE — Telephone Encounter (Signed)
Ok to use any of the held spots this week

## 2020-08-20 NOTE — Telephone Encounter (Signed)
Spoke to patient's daughter, Deborah(DPR). [name] stated that patient has a hx of bronchiectasis and has a chronic cough. Cough had been under control, however patient was recently dx with covid and since her cough has worsened.  C/o prod cough with white sputum. Denies fever, chills, sweats, sob, wheezing or additional.  Fully vaccinated against covid and flu.  Patient is taking hypertonic solution once daily, tussin OTC BID, Prilosec OTC daily, flonase BID. Recently competed course of azithromycin on 08/01/2020.  Dr. Melvyn Novas, please advise. Thanks

## 2020-08-20 NOTE — Telephone Encounter (Signed)
Can't treat this over the phone, needs ov this week- ok to add on to 7/28 or 7/29 pm if no other provider available or I don't have anything sooner, assuming it's been > 10 days since dx of covid

## 2020-08-21 ENCOUNTER — Encounter (HOSPITAL_COMMUNITY): Payer: Self-pay

## 2020-08-21 ENCOUNTER — Other Ambulatory Visit: Payer: Self-pay

## 2020-08-21 ENCOUNTER — Emergency Department (HOSPITAL_COMMUNITY)
Admission: EM | Admit: 2020-08-21 | Discharge: 2020-08-21 | Disposition: A | Discharge status: Home / Self Care | Payer: Medicare Other | Attending: Emergency Medicine | Admitting: Emergency Medicine

## 2020-08-21 ENCOUNTER — Emergency Department (HOSPITAL_COMMUNITY): Payer: Medicare Other

## 2020-08-21 ENCOUNTER — Ambulatory Visit: Payer: Medicare Other | Admitting: Internal Medicine

## 2020-08-21 DIAGNOSIS — N289 Disorder of kidney and ureter, unspecified: Secondary | ICD-10-CM | POA: Diagnosis not present

## 2020-08-21 DIAGNOSIS — R0602 Shortness of breath: Secondary | ICD-10-CM | POA: Diagnosis not present

## 2020-08-21 DIAGNOSIS — R059 Cough, unspecified: Secondary | ICD-10-CM | POA: Diagnosis not present

## 2020-08-21 DIAGNOSIS — R079 Chest pain, unspecified: Secondary | ICD-10-CM | POA: Diagnosis not present

## 2020-08-21 DIAGNOSIS — N2889 Other specified disorders of kidney and ureter: Secondary | ICD-10-CM | POA: Insufficient documentation

## 2020-08-21 DIAGNOSIS — Z79899 Other long term (current) drug therapy: Secondary | ICD-10-CM | POA: Diagnosis not present

## 2020-08-21 DIAGNOSIS — J449 Chronic obstructive pulmonary disease, unspecified: Secondary | ICD-10-CM | POA: Diagnosis not present

## 2020-08-21 DIAGNOSIS — J189 Pneumonia, unspecified organism: Secondary | ICD-10-CM | POA: Diagnosis not present

## 2020-08-21 DIAGNOSIS — J690 Pneumonitis due to inhalation of food and vomit: Secondary | ICD-10-CM | POA: Insufficient documentation

## 2020-08-21 DIAGNOSIS — I10 Essential (primary) hypertension: Secondary | ICD-10-CM | POA: Diagnosis not present

## 2020-08-21 DIAGNOSIS — R109 Unspecified abdominal pain: Secondary | ICD-10-CM | POA: Diagnosis not present

## 2020-08-21 LAB — CBC WITH DIFFERENTIAL/PLATELET
Abs Immature Granulocytes: 0.03 10*3/uL (ref 0.00–0.07)
Basophils Absolute: 0.1 10*3/uL (ref 0.0–0.1)
Basophils Relative: 0 %
Eosinophils Absolute: 0.1 10*3/uL (ref 0.0–0.5)
Eosinophils Relative: 1 %
HCT: 32.2 % — ABNORMAL LOW (ref 36.0–46.0)
Hemoglobin: 10.3 g/dL — ABNORMAL LOW (ref 12.0–15.0)
Immature Granulocytes: 0 %
Lymphocytes Relative: 78 %
Lymphs Abs: 12.3 10*3/uL — ABNORMAL HIGH (ref 0.7–4.0)
MCH: 34.6 pg — ABNORMAL HIGH (ref 26.0–34.0)
MCHC: 32 g/dL (ref 30.0–36.0)
MCV: 108.1 fL — ABNORMAL HIGH (ref 80.0–100.0)
Monocytes Absolute: 0.5 10*3/uL (ref 0.1–1.0)
Monocytes Relative: 3 %
Neutro Abs: 2.9 10*3/uL (ref 1.7–7.7)
Neutrophils Relative %: 18 %
Platelets: 175 10*3/uL (ref 150–400)
RBC: 2.98 MIL/uL — ABNORMAL LOW (ref 3.87–5.11)
RDW: 15.3 % (ref 11.5–15.5)
WBC: 16 10*3/uL — ABNORMAL HIGH (ref 4.0–10.5)
nRBC: 0 % (ref 0.0–0.2)

## 2020-08-21 LAB — COMPREHENSIVE METABOLIC PANEL
ALT: 18 U/L (ref 0–44)
AST: 24 U/L (ref 15–41)
Albumin: 3.7 g/dL (ref 3.5–5.0)
Alkaline Phosphatase: 63 U/L (ref 38–126)
Anion gap: 6 (ref 5–15)
BUN: 18 mg/dL (ref 8–23)
CO2: 27 mmol/L (ref 22–32)
Calcium: 9.2 mg/dL (ref 8.9–10.3)
Chloride: 100 mmol/L (ref 98–111)
Creatinine, Ser: 0.7 mg/dL (ref 0.44–1.00)
GFR, Estimated: >60 mL/min (ref >60)
Glucose, Bld: 94 mg/dL (ref 70–99)
Potassium: 4.1 mmol/L (ref 3.5–5.1)
Sodium: 133 mmol/L — ABNORMAL LOW (ref 135–145)
Total Bilirubin: 0.8 mg/dL (ref 0.3–1.2)
Total Protein: 6.5 g/dL (ref 6.5–8.1)

## 2020-08-21 LAB — POC OCCULT BLOOD, ED: Fecal Occult Bld: NEGATIVE

## 2020-08-21 LAB — LIPASE, BLOOD: Lipase: 37 U/L (ref 11–51)

## 2020-08-21 LAB — TROPONIN I (HIGH SENSITIVITY): Troponin I (High Sensitivity): 4 ng/L (ref <18)

## 2020-08-21 LAB — BRAIN NATRIURETIC PEPTIDE: B Natriuretic Peptide: 56.2 pg/mL (ref 0.0–100.0)

## 2020-08-21 MED ORDER — IOHEXOL 350 MG/ML SOLN
80.0000 mL | Freq: Once | INTRAVENOUS | Status: AC | PRN
  Administered 2020-08-21: 80 mL via INTRAVENOUS

## 2020-08-21 MED ORDER — AMOXICILLIN-POT CLAVULANATE 875-125 MG PO TABS: 1.0000 | ORAL_TABLET | Freq: Two times a day (BID) | ORAL | 0 refills | Status: AC

## 2020-08-21 MED ORDER — ALBUTEROL SULFATE HFA 108 (90 BASE) MCG/ACT IN AERS: 2.0000 | INHALATION_SPRAY | RESPIRATORY_TRACT | Status: DC | PRN

## 2020-08-21 MED ORDER — AMOXICILLIN-POT CLAVULANATE 875-125 MG PO TABS
1.0000 | ORAL_TABLET | Freq: Once | ORAL | Status: AC
  Administered 2020-08-21: 1 via ORAL
  Filled 2020-08-21: qty 1

## 2020-08-21 MED ORDER — AZITHROMYCIN 250 MG PO TABS
500.0000 mg | ORAL_TABLET | Freq: Once | ORAL | Status: AC
  Administered 2020-08-21: 500 mg via ORAL
  Filled 2020-08-21: qty 2

## 2020-08-21 MED ORDER — AZITHROMYCIN 250 MG PO TABS: 250.0000 mg | ORAL_TABLET | Freq: Every day | ORAL | 0 refills | Status: AC

## 2020-08-21 NOTE — ED Triage Notes (Signed)
Patient is from [facility]. Patient was Covid + on 08/04/20. Patient continues to c/o SOB and a cough. Patient was scheduled for [facility] Pulmonology today, but c/o SOB and upper abdominal pain.

## 2020-08-21 NOTE — Progress Notes (Deleted)
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 Chest CT 5/30  started with a head cold before xmas 2018  "just like everybody else" and some better after abx then worse p several weeks p finished abx and about the same since despite additional coursed abx though does not know names  Mucus was mostly clear occ yellow min volume and no change off  abx x several months  Worse at hs then resolves s noct or am distubance or flare  rec Augmentin 875 mg take one pill twice daily  X 10 days - Prednisone 10 mg take  4 each am x 2 days,   2 each am x 2 days,  1 each am x 2 days and stop  For mucinex dm 1200 mg every 12 hours and use the flutter valve as much as you can  Try prilosec otc '20mg'$   Take 30-60 min before first meal of the day and Pepcid ac (famotidine) 20 mg one @  bedtime until cough is completely gone for at least a week without the need for cough suppression GERD (REFLUX)   If not better when you finish your augmentin you will need a CT sinus > neg 07/20/17   07/21/17 NP Eval/ Mary Kim Spirometry c/w mild obst > symb 80 2bid / zyrtec hs  08/19/2017  f/u ov/Mary Kim re: obst bronchiectasis  Chief Complaint  Patient presents with   Follow-up    Pt states she does not cough much. She only coughs after uses her flutter valve about 3 x per day- prod with clear sputum.    Dyspnea:  Not limited Cough: some hoarseness but cough is much better SABA use: none 02: none   rec No change > did not continue  symbicort or pepcid      12/02/2017  f/u ov/Mary Kim re: s/p bronchiectasis flare now off symbicort  And no change on vs off perceived  Chief Complaint  Patient presents with   Follow-up    Cough is much improved. She produces clear sputum.   Dyspnea:  DR and back ok / [employer]  [place] leaning on cart  Cough: no am flare Using flutter  Prn  Sleeping: about 30 degrees with blocks / sleeps fine  SABA use: none 02: none rec If cough / congestion > mucinex max dose is 1200 mg every 12 hours and use the flutter valve as much as possible  GERD diet  Please schedule a follow up visit in 6  months but call sooner if needed     10/22/2018  f/u ov/Mary Kim re: bronchiectasis / symptoms improved since started on 3% saline bid Chief Complaint  Patient presents with   Obstructive Bronchiectasis    Using nebulizer machine has seen improvement.  Dyspnea:  Walking up to 20 min daily  Cough: better / no purulent sputum Sleeping: wedge pillow/ bed is flat  SABA use: none 02: none  [place] > f/u with Mary Kim  rec No change in medications Please schedule a follow up visit in 12  months but call sooner if needed    10/24/2019  f/u ov/Mary Kim re: bronchiectasis / stopped 3% x 2 days due to itching x months / vaccinated with Moderna last dose 02/28/19 Chief Complaint  Patient presents with   Follow-up   Dyspnea:  Walking inside [facility] "all day " Cough: none  Sleeping: flat bed wedge pillow no am sputtering  SABA use: no inhalers 02: none  Rec No change rx   08/21/2020  acute  ov/Mary Kim re:  No chief complaint on file.   Dyspnea:  *** Cough: *** Sleeping: *** SABA use: *** 02: *** Covid status:   ***   No obvious day to day or daytime variability or assoc excess/ purulent sputum or mucus plugs or hemoptysis or cp or chest tightness, subjective wheeze or overt sinus or hb symptoms.   *** without nocturnal  or early am exacerbation  of respiratory  c/o's or need for noct saba. Also  denies any obvious fluctuation of symptoms with weather or environmental changes or other aggravating or alleviating factors except as outlined above   No unusual exposure hx or h/o childhood pna/ asthma or knowledge of premature birth.  Current Allergies, Complete Past Medical History, Past Surgical History, Family History, and Social History were reviewed in [employer] record.  ROS  The following are not active complaints unless bolded Hoarseness, sore throat, dysphagia, dental problems, itching, sneezing,  nasal congestion or discharge of excess mucus or purulent secretions, ear ache,   fever, chills, sweats, unintended wt loss or wt gain, classically pleuritic or exertional cp,  orthopnea pnd or arm/hand swelling  or leg swelling, presyncope, palpitations, abdominal pain, anorexia, nausea, vomiting, diarrhea  or change in bowel habits or change in bladder habits, change in stools or change in urine, dysuria, hematuria,  rash, arthralgias, visual complaints, headache, numbness, weakness or ataxia or problems with walking or coordination,  change in mood or  memory.        No outpatient medications have been marked as taking for the 08/21/20 encounter (Appointment) with Mary Rockers, MD.                         Objective:   Physical Exam     08/21/2020       *** 10/24/2019       106 10/22/2018       106  12/02/2017       114  08/19/2017       113   07/01/17 113 lb 12.8 oz (51.6 kg)  03/17/17 118 lb 9.6 oz (53.8 kg)  09/04/15 124 lb 3.2 oz (56.3 kg)     Vital signs reviewed  08/21/2020  - Note at rest 02 sats  ***% on ***   General appearance:    ***      Assessment:

## 2020-08-21 NOTE — ED Provider Notes (Signed)
[facility] DEPT Provider Note   CSN: [id number] Arrival date & time: 08/21/20  [id number]     History Chief Complaint  Patient presents with   Cough   Shortness of Breath   Abdominal Pain    Mary Kim is a 87 y.o. female.  87 year old female with past medical history including CLL, bronchiectasis, GERD, hypertension, hyperlipidemia who presents with cough and shortness of breath.  Patient was diagnosed with COVID-19 infection on 7/9.  She has had associated cough that has been persistent.  She states that she continues to have cough with mucus in her chest and at times the coughing fit makes her feel very short of breath.  She denies any associated chest pain.  Daughter notes that she has also recently complained of intermittent epigastric abdominal pain, mostly after she eats.  She denies any associated nausea or vomiting.  She has noticed that for the past several days, her stool has been dark.  She takes iron supplementation but this stool seems darker than usual.  Last bowel movement was yesterday.  She denies history of GI bleeding, NSAID use, or previous GI eval.   The history is provided by the patient.  Cough Associated symptoms: shortness of breath   Shortness of Breath Associated symptoms: abdominal pain and cough   Abdominal Pain Associated symptoms: cough and shortness of breath       Past Medical History:  Diagnosis Date   Chronic cough    Elevated cholesterol    GERD (gastroesophageal reflux disease)    Hearing loss    left hearing aid   Hyperlipidemia    Hypertension    Lymphoma ([place])    Osteoporosis     Patient Active Problem List   Diagnosis Date Noted   CLL (chronic lymphocytic leukemia) ([place]) 02/02/2019   Dizziness 03/23/2018   Allergic rhinitis 11/10/2017   Eustachian tube dysfunction, unspecified laterality 11/10/2017   Obstructive bronchiectasis ([place]) 07/08/2017   Chronic cough 07/01/2017    Past Surgical History:   Procedure Laterality Date   CATARACT EXTRACTION  2011   LYMPH NODE BIOPSY Left 01/10/2019   Procedure: EXCISIONAL BIOPSY OF CERVICAL LYMPH NODE/LEFT;  Surgeon: Izora Gala, MD;  Location: [facility];  Service: ENT;  Laterality: Left;     OB History     Gravida  0   Para  0   Term  0   Preterm  0   AB  0   Living         SAB  0   IAB  0   Ectopic  0   Multiple      Live Births              History reviewed. No pertinent family history.  Social History   Tobacco Use   Smoking status: Never   Smokeless tobacco: Never  Vaping Use   Vaping Use: Never used  Substance Use Topics   Alcohol use: No   Drug use: No    Home Medications Prior to Admission medications   Medication Sig Start Date End Date Taking? Authorizing Provider  amoxicillin-clavulanate (AUGMENTIN) 875-125 MG tablet Take 1 tablet by mouth every 12 (twelve) hours. 08/21/20  Yes Larrissa Stivers, Wenda Overland, MD  azithromycin (ZITHROMAX) 250 MG tablet Take 1 tablet (250 mg total) by mouth daily. 08/22/20  Yes Jayce Kainz, Wenda Overland, MD  amLODipine (NORVASC) 5 MG tablet Take 5 mg by mouth every morning. 07/01/20   [provider]  Calcium Carbonate-Vitamin D 600-400 MG-UNIT tablet Take 1 tablet by mouth daily.    [provider]  cholecalciferol (VITAMIN D) 1000 UNITS tablet Take 1,000 Units by mouth daily.    [provider]  Clotrimazole 1 % OINT Apply 1 application topically 2 (two) times daily. To fungal rash over chest wall and under breast Patient not taking: No sig reported 12/15/19   Brunetta Genera, MD  denosumab (PROLIA) 60 MG/ML SOLN injection Inject 60 mg into the skin every 6 (six) months. Administer in upper arm, thigh, or abdomen    [provider]  fluticasone (FLONASE) 50 MCG/ACT nasal spray Place 2 sprays into both nostrils 2 (two) times daily. Patient taking differently: Place 2 sprays into both nostrils 2 (two) times daily as needed  for allergies or rhinitis (congestion). 11/10/17   Lauraine Rinne, NP  guaiFENesin (MUCINEX) 600 MG 12 hr tablet Take 1 tablet (600 mg total) by mouth 2 (two) times daily. 07/28/20   Volney American, PA-C  latanoprost (XALATAN) 0.005 % ophthalmic solution Place 1 drop into both eyes at bedtime.  11/10/18   [provider]  Multiple Vitamins-Minerals (PRESERVISION AREDS 2) CAPS Take 1 capsule by mouth 2 (two) times daily.    [provider]  omeprazole (PRILOSEC OTC) 20 MG tablet Take 20 mg by mouth daily.    [provider]  Respiratory Therapy Supplies (FLUTTER) DEVI Use as directed 07/01/17   Tanda Rockers, MD  silver sulfADIAZINE (SILVADENE) 1 % cream Apply 1 application topically 2 (two) times daily. Patient not taking: No sig reported 05/18/20   Hughie Closs, PA-C  sodium chloride HYPERTONIC 3 % nebulizer solution Take two times daily prior to Flutter Valve use Patient taking differently: Take 4 mLs by nebulization 2 (two) times daily. Take two times daily prior to Flutter Valve use 11/15/19   Tanda Rockers, MD    Allergies    Patient has no known allergies.  Review of Systems   Review of Systems  Respiratory:  Positive for cough and shortness of breath.   Gastrointestinal:  Positive for abdominal pain.  All other systems reviewed and are negative except that which was mentioned in HPI  Physical Exam Updated Vital Signs BP (!) 162/92   Pulse 83   Temp 98.6 F (37 C) (Oral)   Resp (!) 21   Ht '5\' 5"'$  (1.651 m)   Wt 46.4 kg   SpO2 97%   BMI 17.02 kg/m   Physical Exam Vitals and nursing note reviewed.  Constitutional:      General: She is not in acute distress.    Appearance: Normal appearance.  HENT:     Head: Normocephalic and atraumatic.  Eyes:     Conjunctiva/sclera: Conjunctivae normal.  Cardiovascular:     Rate and Rhythm: Normal rate and regular rhythm.     Heart sounds: Normal heart sounds. No murmur heard. Pulmonary:      Effort: Pulmonary effort is normal.     Breath sounds: Normal breath sounds.  Abdominal:     General: Abdomen is flat. Bowel sounds are normal. There is no distension.     Palpations: Abdomen is soft.     Tenderness: There is no abdominal tenderness.  Musculoskeletal:     Right lower leg: No edema.     Left lower leg: No edema.  Skin:    General: Skin is warm and dry.  Neurological:     Mental Status: She is alert and  oriented to person, place, and time.     Comments: fluent  Psychiatric:        Mood and Affect: Mood normal.        Behavior: Behavior normal.  Rectal: normal rectal tone, dark stool in vault which is not grossly bloody or melenotic  Chaperone present.   ED Results / Procedures / Treatments   Labs (all labs ordered are listed, but only abnormal results are displayed) Labs Reviewed  COMPREHENSIVE METABOLIC PANEL - Abnormal; Notable for the following components:      Result Value   Sodium 133 (*)    All other components within normal limits  CBC WITH DIFFERENTIAL/PLATELET - Abnormal; Notable for the following components:   WBC 16.0 (*)    RBC 2.98 (*)    Hemoglobin 10.3 (*)    HCT 32.2 (*)    MCV 108.1 (*)    MCH 34.6 (*)    Lymphs Abs 12.3 (*)    All other components within normal limits  BRAIN NATRIURETIC PEPTIDE  LIPASE, BLOOD  POC OCCULT BLOOD, ED  TYPE AND SCREEN  ABO/RH  TROPONIN I (HIGH SENSITIVITY)    EKG EKG Interpretation  Date/Time:  Tuesday August 21 2020 09:53:07 EDT Ventricular Rate:  91 PR Interval:  164 QRS Duration: 104 QT Interval:  354 QTC Calculation: 436 R Axis:   42 Text Interpretation: Sinus rhythm RSR' in V1 or V2, probably normal variant 12 Lead; [place] No significant change since last tracing Confirmed by Theotis Burrow [phone]) on 08/21/2020 5:20:12 PM  Radiology DG Chest 2 View  Result Date: 08/21/2020 CLINICAL DATA:  Shortness of breath, cough EXAM: CHEST - 2 VIEW COMPARISON:  08/04/2020 FINDINGS: There is  hyperinflation of the lungs compatible with COPD. Heart and mediastinal contours are within normal limits. No focal opacities or effusions. No acute bony abnormality. Aortic atherosclerosis. Mild chronic compression fracture of the lower thoracic spine, stable. IMPRESSION: COPD, aortic atherosclerosis. No active disease. Electronically Signed   By: Rolm Baptise M.D.   On: 08/21/2020 10:25   CT Angio Chest PE W/Cm &/Or Wo Cm  Result Date: 08/21/2020 CLINICAL DATA:  Shortness of breath and upper abdominal pain, recent COVID infection in a 87 year old female. EXAM: CT ANGIOGRAPHY CHEST CT ABDOMEN AND PELVIS WITH CONTRAST TECHNIQUE: Multidetector CT imaging of the chest was performed using the standard protocol during bolus administration of intravenous contrast. Multiplanar CT image reconstructions and MIPs were obtained to evaluate the vascular anatomy. Multidetector CT imaging of the abdomen and pelvis was performed using the standard protocol during bolus administration of intravenous contrast. CONTRAST:  63m OMNIPAQUE IOHEXOL 350 MG/ML SOLN COMPARISON:  Only plain film comparison is and prior imaging of the cervical spine and neck are available for comparison. FINDINGS: CTA CHEST FINDINGS Cardiovascular: Normal caliber thoracic aorta. Calcified atheromatous plaque in the thoracic aorta. Normal heart size. Three-vessel coronary artery disease. No pericardial effusion. No sign of pulmonary embolism. Mediastinum/Nodes: Mildly patulous esophagus. No thoracic inlet adenopathy. No axillary lymphadenopathy. No internal mammary adenopathy. No hilar lymphadenopathy. No mediastinal adenopathy. Lungs/Pleura: Basilar atelectasis/patchy opacities. Mild airway thickening. Some material in lower lobe bronchi. No consolidative changes. No pneumothorax. Suspected scarring in the lingula. Musculoskeletal: No acute musculoskeletal findings. Healed rib fractures along the LEFT lateral chest at multiple levels posteriorly and  laterally. Healed scapular fracture. Loss of height of T11 without surrounding stranding, approximately 30% loss of height at this level. This appears unchanged compared to prior imaging, chest x-ray from September of 2021. Spinal degenerative  changes. Sternum is intact. Costochondral elements are intact. Review of the MIP images confirms the above findings. CT ABDOMEN and PELVIS FINDINGS Hepatobiliary: No focal, suspicious hepatic lesion. No pericholecystic stranding. No biliary duct dilation. Portal vein is patent. Pancreas: Normal, without mass, inflammation or ductal dilatation. Spleen: Normal spleen. Adrenals/Urinary Tract: Adrenal glands are normal. Symmetric renal enhancement. Hypodense lesion relative to renal cortex measuring 18 x 16 mm arises from the lateral interpolar RIGHT kidney measuring 88 Hounsfield units on nephrographic phase and 133 Hounsfield units on venous phase. No hydronephrosis. No perinephric stranding. Urinary bladder with smooth contours. Stomach/Bowel: No acute gastrointestinal process. Appendix is normal. Stool throughout the colon. Vascular/Lymphatic: Calcified atheromatous plaque and noncalcified plaque in the abdominal aorta extending in the iliac vessels. No aneurysmal dilation. Mildly enlarged LEFT periaortic lymph nodes largest 9 mm. No retrocaval nodes or nodes adjacent to the RIGHT renal hilum. Reproductive: Uterus remains in situ. No adnexal mass. No pelvic lymphadenopathy. Other: No free air.  No ascites. Musculoskeletal: Osteopenia. Spinal degenerative changes. No acute bone finding or destructive bone process. Review of the MIP images confirms the above findings. IMPRESSION: 1. No evidence of pulmonary embolism. 2. Basilar atelectasis/patchy opacities. Some material in lower lobe bronchi. Correlate with any history of or risk factors for aspiration, could also be seen in the setting of prior COVID infection or chronic infection as outlined in previous imaging reports. 3.  Findings suspicious for solid renal neoplasm/RCC associated with the interpolar RIGHT kidney. Consider follow-up dedicated renal protocol CT or MR when the patient is able. 4. Mildly enlarged LEFT periaortic lymph nodes largest 9 mm. consider attention on follow-up. Not on the side expected for involvement from RIGHT renal lesion. 5. Healed rib fractures along the LEFT lateral chest at multiple levels posteriorly and laterally. 6. Aortic atherosclerosis. Findings regarding RIGHT renal mass were called by telephone at the time of interpretation on 08/21/2020 at 8:31 pm to provider Vincie Linn , who verbally acknowledged these results. Aortic Atherosclerosis (ICD10-I70.0). Electronically Signed   By: Zetta Bills M.D.   On: 08/21/2020 20:28   CT Abdomen Pelvis W Contrast  Result Date: 08/21/2020 CLINICAL DATA:  Shortness of breath and upper abdominal pain, recent COVID infection in a 87 year old female. EXAM: CT ANGIOGRAPHY CHEST CT ABDOMEN AND PELVIS WITH CONTRAST TECHNIQUE: Multidetector CT imaging of the chest was performed using the standard protocol during bolus administration of intravenous contrast. Multiplanar CT image reconstructions and MIPs were obtained to evaluate the vascular anatomy. Multidetector CT imaging of the abdomen and pelvis was performed using the standard protocol during bolus administration of intravenous contrast. CONTRAST:  63m OMNIPAQUE IOHEXOL 350 MG/ML SOLN COMPARISON:  Only plain film comparison is and prior imaging of the cervical spine and neck are available for comparison. FINDINGS: CTA CHEST FINDINGS Cardiovascular: Normal caliber thoracic aorta. Calcified atheromatous plaque in the thoracic aorta. Normal heart size. Three-vessel coronary artery disease. No pericardial effusion. No sign of pulmonary embolism. Mediastinum/Nodes: Mildly patulous esophagus. No thoracic inlet adenopathy. No axillary lymphadenopathy. No internal mammary adenopathy. No hilar lymphadenopathy. No  mediastinal adenopathy. Lungs/Pleura: Basilar atelectasis/patchy opacities. Mild airway thickening. Some material in lower lobe bronchi. No consolidative changes. No pneumothorax. Suspected scarring in the lingula. Musculoskeletal: No acute musculoskeletal findings. Healed rib fractures along the LEFT lateral chest at multiple levels posteriorly and laterally. Healed scapular fracture. Loss of height of T11 without surrounding stranding, approximately 30% loss of height at this level. This appears unchanged compared to prior imaging, chest x-ray from September of 2021.  Spinal degenerative changes. Sternum is intact. Costochondral elements are intact. Review of the MIP images confirms the above findings. CT ABDOMEN and PELVIS FINDINGS Hepatobiliary: No focal, suspicious hepatic lesion. No pericholecystic stranding. No biliary duct dilation. Portal vein is patent. Pancreas: Normal, without mass, inflammation or ductal dilatation. Spleen: Normal spleen. Adrenals/Urinary Tract: Adrenal glands are normal. Symmetric renal enhancement. Hypodense lesion relative to renal cortex measuring 18 x 16 mm arises from the lateral interpolar RIGHT kidney measuring 88 Hounsfield units on nephrographic phase and 133 Hounsfield units on venous phase. No hydronephrosis. No perinephric stranding. Urinary bladder with smooth contours. Stomach/Bowel: No acute gastrointestinal process. Appendix is normal. Stool throughout the colon. Vascular/Lymphatic: Calcified atheromatous plaque and noncalcified plaque in the abdominal aorta extending in the iliac vessels. No aneurysmal dilation. Mildly enlarged LEFT periaortic lymph nodes largest 9 mm. No retrocaval nodes or nodes adjacent to the RIGHT renal hilum. Reproductive: Uterus remains in situ. No adnexal mass. No pelvic lymphadenopathy. Other: No free air.  No ascites. Musculoskeletal: Osteopenia. Spinal degenerative changes. No acute bone finding or destructive bone process. Review of the MIP  images confirms the above findings. IMPRESSION: 1. No evidence of pulmonary embolism. 2. Basilar atelectasis/patchy opacities. Some material in lower lobe bronchi. Correlate with any history of or risk factors for aspiration, could also be seen in the setting of prior COVID infection or chronic infection as outlined in previous imaging reports. 3. Findings suspicious for solid renal neoplasm/RCC associated with the interpolar RIGHT kidney. Consider follow-up dedicated renal protocol CT or MR when the patient is able. 4. Mildly enlarged LEFT periaortic lymph nodes largest 9 mm. consider attention on follow-up. Not on the side expected for involvement from RIGHT renal lesion. 5. Healed rib fractures along the LEFT lateral chest at multiple levels posteriorly and laterally. 6. Aortic atherosclerosis. Findings regarding RIGHT renal mass were called by telephone at the time of interpretation on 08/21/2020 at 8:31 pm to provider Kerline Trahan , who verbally acknowledged these results. Aortic Atherosclerosis (ICD10-I70.0). Electronically Signed   By: Zetta Bills M.D.   On: 08/21/2020 20:28    Procedures Procedures   Medications Ordered in ED Medications  albuterol (VENTOLIN HFA) 108 (90 Base) MCG/ACT inhaler 2 puff (has no administration in time range)  iohexol (OMNIPAQUE) 350 MG/ML injection 80 mL (80 mLs Intravenous Contrast Given 08/21/20 1916)  amoxicillin-clavulanate (AUGMENTIN) 875-125 MG per tablet 1 tablet (1 tablet Oral Given 08/21/20 2246)  azithromycin (ZITHROMAX) tablet 500 mg (500 mg Oral Given 08/21/20 2247)    ED Course  I have reviewed the triage vital signs and the nursing notes.  Pertinent labs & imaging results that were available during my care of the patient were reviewed by me and considered in my medical decision making (see chart for details).    MDM Rules/Calculators/A&P                           Alert and nontoxic on exam, reassuring vital signs, normal O2 saturations.  Lab  work notable for WBC 16, normal BNP and troponin, reassuring CMP.  Obtained CT angio of chest to evaluate for PE given shortness of breath and recent hospitalization; also obtain CT of abdomen and pelvis to evaluate her recent abdominal pain.  Imaging shows no evidence of PE; some opacities in lower lobe bronchi, possible aspiration versus chronic infection versus recent COVID infection.  Because of her persistent productive cough, will cover with aspiration with Augmentin and add azithromycin for  some atypical coverage.  She does have incidental finding of renal mass.  I discussed this with the patient and her daughter and explained if they want to pursue further work-up they can follow-up with PCP for further imaging.  Patient is not interested in further work-up of this finding.  She is otherwise well-appearing and breathing comfortably on room air.  We will have her follow closely with pulmonology.  I have reviewed return precautions with them and they voiced understanding. Final Clinical Impression(s) / ED Diagnoses Final diagnoses:  Aspiration pneumonia, unspecified aspiration pneumonia type, unspecified laterality, unspecified part of lung ([place])  Right renal mass    Rx / DC Orders ED Discharge Orders          Ordered    azithromycin (ZITHROMAX) 250 MG tablet  Daily        08/21/20 2201    amoxicillin-clavulanate (AUGMENTIN) 875-125 MG tablet  Every 12 hours        08/21/20 2201             Kushal Saunders, Wenda Overland, MD 08/22/20 0010

## 2020-08-21 NOTE — ED Notes (Signed)
Patient discharged with daughter no concerns at this time

## 2020-08-21 NOTE — Discharge Instructions (Addendum)
Take antibiotics as prescribed starting tomorrow morning and follow-up with pulmonology.  You did have a right kidney mass on imaging and it may be cancerous. If you would like to pursue further information, please contact your primary care provider to schedule a renal protocol CT or MRI.

## 2020-08-21 NOTE — ED Provider Notes (Signed)
Emergency Medicine Provider Triage Evaluation Note  Mary Kim , a 87 y.o. female  was evaluated in triage.  Pt complains of shortness of breath and cough.  She has history of COPD and is post to see her pulmonologist early this morning, but she felt too weak to make the appointment.  She has been coughing more than his usual, she did have COVID on 08/04/2020.  She has been coughing up phlegm.  Has not missed any of her medication doses. She also endorses pain in the midsternum that comes and goes.  Worse with coughing.  Review of Systems  Positive: Chest pain, cough, shortness of breath Negative: Nausea, vomiting  Physical Exam  BP (!) 151/80 (BP Location: Left Arm)   Pulse 94   Temp 98.6 F (37 C) (Oral)   Resp 20   Ht '5\' 5"'$  (1.651 m)   Wt 46.4 kg   SpO2 97%   BMI 17.02 kg/m  Gen:   Awake, no distress   Resp:  Normal effort, coughing  MSK:   Moves extremities without difficulty  Other:    Medical Decision Making  Medically screening exam initiated at 10:37 AM.  Appropriate orders placed.  Mary Kim was informed that the remainder of the evaluation will be completed by another provider, this initial triage assessment does not replace that evaluation, and the importance of remaining in the ED until their evaluation is complete.     Sherrill Raring, PA-C 08/21/20 1039    Davonna Belling, MD 08/21/20 1601

## 2020-08-22 LAB — TYPE AND SCREEN
ABO/RH(D): O POS
Antibody Screen: POSITIVE
DAT, IgG: POSITIVE

## 2020-08-23 DIAGNOSIS — N2889 Other specified disorders of kidney and ureter: Secondary | ICD-10-CM | POA: Diagnosis not present

## 2020-08-23 DIAGNOSIS — J189 Pneumonia, unspecified organism: Secondary | ICD-10-CM | POA: Diagnosis not present

## 2020-08-23 DIAGNOSIS — J479 Bronchiectasis, uncomplicated: Secondary | ICD-10-CM | POA: Diagnosis not present

## 2020-08-23 DIAGNOSIS — C9110 Chronic lymphocytic leukemia of B-cell type not having achieved remission: Secondary | ICD-10-CM | POA: Diagnosis not present

## 2020-08-23 DIAGNOSIS — E46 Unspecified protein-calorie malnutrition: Secondary | ICD-10-CM | POA: Diagnosis not present

## 2020-08-24 ENCOUNTER — Other Ambulatory Visit: Payer: Self-pay

## 2020-08-24 ENCOUNTER — Ambulatory Visit (INDEPENDENT_AMBULATORY_CARE_PROVIDER_SITE_OTHER): Payer: Medicare Other | Admitting: Internal Medicine

## 2020-08-24 ENCOUNTER — Encounter: Payer: Self-pay | Admitting: Internal Medicine

## 2020-08-24 DIAGNOSIS — J479 Bronchiectasis, uncomplicated: Secondary | ICD-10-CM

## 2020-08-24 MED ORDER — ALBUTEROL SULFATE (2.5 MG/3ML) 0.083% IN NEBU: 2.5000 mg | INHALATION_SOLUTION | RESPIRATORY_TRACT | 12 refills | Status: DC | PRN

## 2020-08-24 NOTE — Progress Notes (Signed)
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 Chest CT 5/30  started with a head cold before xmas 2018  "just like everybody else" and some better after abx then worse p several weeks p finished abx and about the same since despite additional coursed abx though does not know names  Mucus was mostly clear occ yellow min volume and no change off  abx x several months  Worse at hs then resolves s noct or am distubance or flare  rec Augmentin 875 mg take one pill twice daily  X 10 days - Prednisone 10 mg take  4 each am x 2 days,   2 each am x 2 days,  1 each am x 2 days and stop  For mucinex dm 1200 mg every 12 hours and use the flutter valve as much as you can  Try prilosec otc '20mg'$   Take 30-60 min before first meal of the day and Pepcid ac (famotidine) 20 mg one @  bedtime until cough is completely gone for at least a week without the need for cough suppression GERD (REFLUX)   If not better when you finish your augmentin you will need a CT sinus > neg 07/20/17   07/21/17 NP Eval/ Mary Kim Spirometry c/w mild obst > symb 80 2bid / zyrtec hs   08/19/2017  f/u ov/Mary Kim re: obst bronchiectasis  Chief Complaint  Patient presents with   Follow-up    Pt states she does not cough much. She only coughs after uses her flutter valve about 3 x per day- prod with clear sputum.    Dyspnea:  Not limited Cough: some hoarseness but cough is much better SABA use: none 02: none   rec No change > did not continue  symbicort or pepcid      12/02/2017  f/u ov/Mary Kim re: s/p bronchiectasis flare now off symbicort  And no change on vs off perceived  Chief Complaint  Patient presents with   Follow-up    Cough is much improved. She produces clear sputum.   Dyspnea:  DR and back ok / [employer]  [place] leaning on cart  Cough: no am flare Using flutter  Prn  Sleeping: about 30 degrees with blocks / sleeps fine  SABA use: none 02: none rec If cough / congestion > mucinex max dose is 1200 mg every 12 hours and use the flutter valve as much as possible  GERD diet  Please schedule a follow up visit in 6  months but call sooner if needed     10/22/2018  f/u ov/Mary Kim re: bronchiectasis / symptoms improved since started on 3% saline bid Chief Complaint  Patient presents with   Obstructive Bronchiectasis    Using nebulizer machine has seen improvement.  Dyspnea:  Walking up to 20 min daily  Cough: better / no purulent sputum Sleeping: wedge pillow/ bed is flat  SABA use: none 02:  none Salivary stone > f/u with Constance Holster  rec No change in medications Please schedule a follow up visit in 12  months but call sooner if needed     Covid  symptoms 1st of the month of July 2022 then monoclonal ab given on 08/04/20      08/24/2020  acute  ov/Mary Kim re: bronchiectasis with flare   x  3 weeks Chief Complaint  Patient presents with   Follow-up    Patient was in the ER 08/21/20.   Dyspnea: improved since on neb bid (was up to q 4 h prn so this would be a reduction over previously rec contingency rx  Cough:  improved on augment/ zmax per ER Sleeping: better now  SABA use: as above, misunderstood saba recs      No obvious day to day or daytime variability or ongoing assoc excess/ purulent sputum or mucus plugs or hemoptysis or cp or chest tightness, subjective wheeze or overt sinus or hb symptoms.     Also denies any obvious fluctuation of symptoms with weather or environmental changes or other aggravating or  alleviating factors except as outlined above   No unusual exposure hx or h/o childhood pna/ asthma or knowledge of premature birth.  Current Allergies, Complete Past Medical History, Past Surgical History, Family History, and Social History were reviewed in [employer] record.  ROS  The following are not active complaints unless bolded Hoarseness, sore throat, dysphagia, dental problems, itching, sneezing,  nasal congestion or discharge of excess mucus or purulent secretions, ear ache,   fever, chills, sweats, unintended wt loss or wt gain, classically pleuritic or exertional cp,  orthopnea pnd or arm/hand swelling  or leg swelling, presyncope, palpitations, abdominal pain, anorexia, nausea, vomiting, diarrhea  or change in bowel habits or change in bladder habits, change in stools or change in urine, dysuria, hematuria,  rash, arthralgias, visual complaints, headache, numbness, weakness or ataxia or problems with walking or coordination,  change in mood or  memory.        Current Meds  Medication Sig   amLODipine (NORVASC) 5 MG tablet Take 5 mg by mouth every morning.   amoxicillin-clavulanate (AUGMENTIN) 875-125 MG tablet Take 1 tablet by mouth every 12 (twelve) hours.   azithromycin (ZITHROMAX) 250 MG tablet Take 1 tablet (250 mg total) by mouth daily.   Calcium Carbonate-Vitamin D 600-400 MG-UNIT tablet Take 1 tablet by mouth daily.   cholecalciferol (VITAMIN D) 1000 UNITS tablet Take 1,000 Units by mouth daily.   denosumab (PROLIA) 60 MG/ML SOLN injection Inject 60 mg into the skin every 6 (six) months. Administer in upper arm, thigh, or abdomen   fluticasone (FLONASE) 50 MCG/ACT nasal spray Place 2 sprays into both nostrils 2 (two) times daily. (Patient taking differently: Place 2 sprays into both nostrils 2 (two) times daily as needed for allergies or rhinitis (congestion).)   guaiFENesin (MUCINEX) 600 MG 12 hr tablet Take 1 tablet (600 mg total) by mouth 2 (two)  times daily.   latanoprost (XALATAN) 0.005 % ophthalmic solution Place 1 drop into both eyes at bedtime.    Multiple Vitamins-Minerals (PRESERVISION AREDS 2) CAPS Take 1 capsule by mouth 2 (two) times daily.   omeprazole (PRILOSEC OTC) 20 MG tablet Take 20 mg by mouth daily.   Respiratory Therapy Supplies (FLUTTER) DEVI Use as directed   sodium chloride HYPERTONIC 3 % nebulizer solution Take two times daily prior to Flutter Valve use (Patient taking differently: Take 4 mLs by nebulization 2 (  two) times daily. Take two times daily prior to Flutter Valve use)                         Objective:   Physical Exam     08/24/2020       95 10/24/2019       106 10/22/2018       106  12/02/2017       114  08/19/2017       113   07/01/17 113 lb 12.8 oz (51.6 kg)  03/17/17 118 lb 9.6 oz (53.8 kg)  09/04/15 124 lb 3.2 oz (56.3 kg)     Vital signs reviewed  08/24/2020  - Note at rest 02 sats  96% on RA   General appearance:    chronically ill elderly wf nad  HEENT : pt wearing mask not removed for exam due to covid - 19 concerns.   NECK :  without JVD/Nodes/TM/ nl carotid upstrokes bilaterally   LUNGS: no acc muscle use,  Min barrel/kkyphotic   contour chest wall with bilateral  slightly decreased bs s audible wheeze and  without cough on insp or exp maneuvers and min  Hyperresonant  to  percussion bilaterally     CV:  RRR  no s3 or murmur or increase in P2, and no edema   ABD:  soft and nontender with pos end  insp Hoover's  in the supine position. No bruits or organomegaly appreciated, bowel sounds nl  MS:   ext warm without deformities, calf tenderness, cyanosis or clubbing No obvious joint restrictions   SKIN: warm and dry without lesions    NEURO:  alert, approp, nl sensorium with  no motor or cerebellar deficits apparent.            I personally reviewed images and agree with radiology impression as follows:   Chest CTa  08/21/20 1. No evidence of pulmonary embolism. 2.  Basilar atelectasis/patchy opacities. Some material in lower lobe bronchi.   3. Findings suspicious for solid renal neoplasm/RCC associated with the interpolar RIGHT kidney. Consider follow-up dedicated renal protocol CT or MR when the patient is able. 4. Mildly enlarged LEFT periaortic lymph nodes largest 9 mm. consider attention on follow-up. Not on the side expected for involvement from RIGHT renal lesion. 5. Healed rib fractures along the LEFT lateral chest at multiple levels posteriorly and laterally. 6. Aortic atherosclerosis. Assessment:

## 2020-08-24 NOTE — Patient Instructions (Addendum)
For severe breath or trouble breathing >  albuterol 2.5 mg up to every 4 hours as needed  For cough  >  mucinex dm 1200 mg every 12 hours and flutter valve as much as possible  Whenever cough flares >  Try prilosec otc '20mg'$   Take 30-60 min before first meal of the day and Pepcid ac (famotidine) 20 mg one  after supper until cough is completely gone for at least a week without the need for cough suppression  Pulmonary follow up is as needed

## 2020-08-25 ENCOUNTER — Encounter: Payer: Self-pay | Admitting: Internal Medicine

## 2020-08-25 NOTE — Assessment & Plan Note (Addendum)
See CT chest 06/26/17 with lingular dz most prominent  - Started symbicort 80 2bid 07/21/17 > off ? When  - Spirometry 08/19/2017  FEV1 1.15 (72%)  Ratio 63  Classic curvature ? After symb?  - added hypertonic saline 03/23/18 and pt d/c'd symbicort > d/c'd hypertonic saline 10/22/19 due to pruritis  Doing better p empirical abx per ER > no ned for change in plan, just follow prior recs for saba q 4 h prn          Each maintenance medication was reviewed in detail including emphasizing most importantly the difference between maintenance and prns and under what circumstances the prns are to be triggered using an action plan format where appropriate.  Total time for H and P, chart review, counseling, reviewing flutter device(s) and generating customized AVS unique to this office visit / same day charting   25 min

## 2020-09-14 DIAGNOSIS — H903 Sensorineural hearing loss, bilateral: Secondary | ICD-10-CM | POA: Diagnosis not present

## 2020-09-14 DIAGNOSIS — H6983 Other specified disorders of Eustachian tube, bilateral: Secondary | ICD-10-CM | POA: Diagnosis not present

## 2020-09-14 DIAGNOSIS — H6523 Chronic serous otitis media, bilateral: Secondary | ICD-10-CM | POA: Diagnosis not present

## 2020-10-09 DIAGNOSIS — H838X3 Other specified diseases of inner ear, bilateral: Secondary | ICD-10-CM | POA: Diagnosis not present

## 2020-10-09 DIAGNOSIS — H6983 Other specified disorders of Eustachian tube, bilateral: Secondary | ICD-10-CM | POA: Diagnosis not present

## 2020-10-09 DIAGNOSIS — H903 Sensorineural hearing loss, bilateral: Secondary | ICD-10-CM | POA: Diagnosis not present

## 2020-10-09 DIAGNOSIS — H6523 Chronic serous otitis media, bilateral: Secondary | ICD-10-CM | POA: Diagnosis not present

## 2020-11-07 ENCOUNTER — Other Ambulatory Visit: Payer: Self-pay | Admitting: Internal Medicine

## 2020-11-07 DIAGNOSIS — Z1231 Encounter for screening mammogram for malignant neoplasm of breast: Secondary | ICD-10-CM

## 2020-12-04 ENCOUNTER — Other Ambulatory Visit: Payer: Self-pay

## 2020-12-04 ENCOUNTER — Ambulatory Visit
Admission: RE | Admit: 2020-12-04 | Discharge: 2020-12-04 | Disposition: A | Discharge status: Home / Self Care | Payer: Medicare Other | Source: Ambulatory Visit

## 2020-12-04 DIAGNOSIS — Z1231 Encounter for screening mammogram for malignant neoplasm of breast: Secondary | ICD-10-CM

## 2020-12-21 DIAGNOSIS — E785 Hyperlipidemia, unspecified: Secondary | ICD-10-CM | POA: Diagnosis not present

## 2020-12-21 DIAGNOSIS — Z79899 Other long term (current) drug therapy: Secondary | ICD-10-CM | POA: Diagnosis not present

## 2020-12-21 DIAGNOSIS — I10 Essential (primary) hypertension: Secondary | ICD-10-CM | POA: Diagnosis not present

## 2020-12-21 DIAGNOSIS — E781 Pure hyperglyceridemia: Secondary | ICD-10-CM | POA: Diagnosis not present

## 2020-12-21 DIAGNOSIS — E559 Vitamin D deficiency, unspecified: Secondary | ICD-10-CM | POA: Diagnosis not present

## 2020-12-26 DIAGNOSIS — R82998 Other abnormal findings in urine: Secondary | ICD-10-CM | POA: Diagnosis not present

## 2020-12-26 DIAGNOSIS — C649 Malignant neoplasm of unspecified kidney, except renal pelvis: Secondary | ICD-10-CM | POA: Diagnosis not present

## 2020-12-26 DIAGNOSIS — Z0000 Encounter for general adult medical examination without abnormal findings: Secondary | ICD-10-CM | POA: Diagnosis not present

## 2020-12-26 DIAGNOSIS — C9110 Chronic lymphocytic leukemia of B-cell type not having achieved remission: Secondary | ICD-10-CM | POA: Diagnosis not present

## 2020-12-26 DIAGNOSIS — K5900 Constipation, unspecified: Secondary | ICD-10-CM | POA: Diagnosis not present

## 2020-12-26 DIAGNOSIS — M810 Age-related osteoporosis without current pathological fracture: Secondary | ICD-10-CM | POA: Diagnosis not present

## 2020-12-26 DIAGNOSIS — I129 Hypertensive chronic kidney disease with stage 1 through stage 4 chronic kidney disease, or unspecified chronic kidney disease: Secondary | ICD-10-CM | POA: Diagnosis not present

## 2020-12-26 DIAGNOSIS — E785 Hyperlipidemia, unspecified: Secondary | ICD-10-CM | POA: Diagnosis not present

## 2020-12-26 DIAGNOSIS — D692 Other nonthrombocytopenic purpura: Secondary | ICD-10-CM | POA: Diagnosis not present

## 2020-12-26 DIAGNOSIS — I708 Atherosclerosis of other arteries: Secondary | ICD-10-CM | POA: Diagnosis not present

## 2020-12-26 DIAGNOSIS — M179 Osteoarthritis of knee, unspecified: Secondary | ICD-10-CM | POA: Diagnosis not present

## 2020-12-26 DIAGNOSIS — R634 Abnormal weight loss: Secondary | ICD-10-CM | POA: Diagnosis not present

## 2020-12-26 DIAGNOSIS — N1831 Chronic kidney disease, stage 3a: Secondary | ICD-10-CM | POA: Diagnosis not present

## 2021-01-03 DIAGNOSIS — M25561 Pain in right knee: Secondary | ICD-10-CM | POA: Diagnosis not present

## 2021-01-03 DIAGNOSIS — M1711 Unilateral primary osteoarthritis, right knee: Secondary | ICD-10-CM | POA: Diagnosis not present

## 2021-01-08 DIAGNOSIS — H43813 Vitreous degeneration, bilateral: Secondary | ICD-10-CM | POA: Diagnosis not present

## 2021-01-08 DIAGNOSIS — H52203 Unspecified astigmatism, bilateral: Secondary | ICD-10-CM | POA: Diagnosis not present

## 2021-01-08 DIAGNOSIS — H401123 Primary open-angle glaucoma, left eye, severe stage: Secondary | ICD-10-CM | POA: Diagnosis not present

## 2021-01-08 DIAGNOSIS — H353133 Nonexudative age-related macular degeneration, bilateral, advanced atrophic without subfoveal involvement: Secondary | ICD-10-CM | POA: Diagnosis not present

## 2021-01-10 ENCOUNTER — Other Ambulatory Visit (HOSPITAL_COMMUNITY): Payer: Self-pay | Admitting: *Deleted

## 2021-01-11 ENCOUNTER — Ambulatory Visit (HOSPITAL_COMMUNITY)
Admission: RE | Admit: 2021-01-11 | Discharge: 2021-01-11 | Disposition: A | Discharge status: Home / Self Care | Payer: Medicare Other | Source: Ambulatory Visit | Attending: Internal Medicine | Admitting: Internal Medicine

## 2021-01-11 ENCOUNTER — Other Ambulatory Visit: Payer: Self-pay

## 2021-01-11 DIAGNOSIS — M810 Age-related osteoporosis without current pathological fracture: Secondary | ICD-10-CM | POA: Insufficient documentation

## 2021-01-11 MED ORDER — DENOSUMAB 60 MG/ML ~~LOC~~ SOSY
PREFILLED_SYRINGE | SUBCUTANEOUS | Status: AC
  Filled 2021-01-11: qty 1

## 2021-01-11 MED ORDER — DENOSUMAB 60 MG/ML ~~LOC~~ SOSY
60.0000 mg | PREFILLED_SYRINGE | Freq: Once | SUBCUTANEOUS | Status: AC
  Administered 2021-01-11: 60 mg via SUBCUTANEOUS

## 2021-02-22 DIAGNOSIS — R636 Underweight: Secondary | ICD-10-CM | POA: Diagnosis not present

## 2021-02-22 DIAGNOSIS — L989 Disorder of the skin and subcutaneous tissue, unspecified: Secondary | ICD-10-CM | POA: Diagnosis not present

## 2021-02-27 DIAGNOSIS — L570 Actinic keratosis: Secondary | ICD-10-CM | POA: Diagnosis not present

## 2021-03-08 DIAGNOSIS — N1831 Chronic kidney disease, stage 3a: Secondary | ICD-10-CM | POA: Diagnosis not present

## 2021-03-08 DIAGNOSIS — C44629 Squamous cell carcinoma of skin of left upper limb, including shoulder: Secondary | ICD-10-CM | POA: Diagnosis not present

## 2021-03-08 DIAGNOSIS — I129 Hypertensive chronic kidney disease with stage 1 through stage 4 chronic kidney disease, or unspecified chronic kidney disease: Secondary | ICD-10-CM | POA: Diagnosis not present

## 2021-03-08 DIAGNOSIS — L989 Disorder of the skin and subcutaneous tissue, unspecified: Secondary | ICD-10-CM | POA: Diagnosis not present

## 2021-03-27 DIAGNOSIS — M1711 Unilateral primary osteoarthritis, right knee: Secondary | ICD-10-CM | POA: Diagnosis not present

## 2021-04-09 DIAGNOSIS — H401123 Primary open-angle glaucoma, left eye, severe stage: Secondary | ICD-10-CM | POA: Diagnosis not present

## 2021-04-09 DIAGNOSIS — H04123 Dry eye syndrome of bilateral lacrimal glands: Secondary | ICD-10-CM | POA: Diagnosis not present

## 2021-04-09 DIAGNOSIS — H353133 Nonexudative age-related macular degeneration, bilateral, advanced atrophic without subfoveal involvement: Secondary | ICD-10-CM | POA: Diagnosis not present

## 2021-04-19 DIAGNOSIS — D485 Neoplasm of uncertain behavior of skin: Secondary | ICD-10-CM | POA: Diagnosis not present

## 2021-04-25 ENCOUNTER — Other Ambulatory Visit: Payer: Self-pay | Admitting: Hematology

## 2021-04-30 ENCOUNTER — Encounter: Payer: Self-pay | Admitting: Hematology

## 2021-05-13 DIAGNOSIS — C44629 Squamous cell carcinoma of skin of left upper limb, including shoulder: Secondary | ICD-10-CM | POA: Diagnosis not present

## 2021-05-16 ENCOUNTER — Telehealth: Payer: Self-pay | Admitting: Hematology

## 2021-05-16 NOTE — Telephone Encounter (Signed)
Per provider reschedule called and spoke to pt about reschedule.    Pt confirmed appointment  ?

## 2021-05-29 DIAGNOSIS — Z20822 Contact with and (suspected) exposure to covid-19: Secondary | ICD-10-CM | POA: Diagnosis not present

## 2021-06-05 DIAGNOSIS — Z23 Encounter for immunization: Secondary | ICD-10-CM | POA: Diagnosis not present

## 2021-06-06 ENCOUNTER — Ambulatory Visit: Payer: Medicare Other | Admitting: Hematology

## 2021-06-06 ENCOUNTER — Other Ambulatory Visit: Payer: Medicare Other

## 2021-06-06 ENCOUNTER — Other Ambulatory Visit: Payer: Self-pay

## 2021-06-06 DIAGNOSIS — C9110 Chronic lymphocytic leukemia of B-cell type not having achieved remission: Secondary | ICD-10-CM

## 2021-06-07 ENCOUNTER — Other Ambulatory Visit: Payer: Self-pay

## 2021-06-07 ENCOUNTER — Inpatient Hospital Stay: Payer: Medicare Other | Attending: Hematology

## 2021-06-07 ENCOUNTER — Inpatient Hospital Stay (HOSPITAL_BASED_OUTPATIENT_CLINIC_OR_DEPARTMENT_OTHER): Payer: Medicare Other | Admitting: Hematology

## 2021-06-07 VITALS — BP 162/86 | HR 76 | Temp 97.7°F | Resp 18 | Ht 65.0 in | Wt 97.3 lb

## 2021-06-07 DIAGNOSIS — C9110 Chronic lymphocytic leukemia of B-cell type not having achieved remission: Secondary | ICD-10-CM | POA: Diagnosis not present

## 2021-06-07 LAB — CMP (CANCER CENTER ONLY)
ALT: 22 U/L (ref 0–44)
AST: 24 U/L (ref 15–41)
Albumin: 4 g/dL (ref 3.5–5.0)
Alkaline Phosphatase: 71 U/L (ref 38–126)
Anion gap: 6 (ref 5–15)
BUN: 27 mg/dL — ABNORMAL HIGH (ref 8–23)
CO2: 27 mmol/L (ref 22–32)
Calcium: 9.5 mg/dL (ref 8.9–10.3)
Chloride: 97 mmol/L — ABNORMAL LOW (ref 98–111)
Creatinine: 0.87 mg/dL (ref 0.44–1.00)
GFR, Estimated: >60 mL/min (ref >60)
Glucose, Bld: 101 mg/dL — ABNORMAL HIGH (ref 70–99)
Potassium: 4.8 mmol/L (ref 3.5–5.1)
Sodium: 130 mmol/L — ABNORMAL LOW (ref 135–145)
Total Bilirubin: 0.5 mg/dL (ref 0.3–1.2)
Total Protein: 7 g/dL (ref 6.5–8.1)

## 2021-06-07 LAB — CBC WITH DIFFERENTIAL (CANCER CENTER ONLY)
Abs Immature Granulocytes: 0.02 10*3/uL (ref 0.00–0.07)
Basophils Absolute: 0.1 10*3/uL (ref 0.0–0.1)
Basophils Relative: 0 %
Eosinophils Absolute: 0.2 10*3/uL (ref 0.0–0.5)
Eosinophils Relative: 1 %
HCT: 32 % — ABNORMAL LOW (ref 36.0–46.0)
Hemoglobin: 10.9 g/dL — ABNORMAL LOW (ref 12.0–15.0)
Immature Granulocytes: 0 %
Lymphocytes Relative: 78 %
Lymphs Abs: 13.5 10*3/uL — ABNORMAL HIGH (ref 0.7–4.0)
MCH: 35.9 pg — ABNORMAL HIGH (ref 26.0–34.0)
MCHC: 34.1 g/dL (ref 30.0–36.0)
MCV: 105.3 fL — ABNORMAL HIGH (ref 80.0–100.0)
Monocytes Absolute: 1.3 10*3/uL — ABNORMAL HIGH (ref 0.1–1.0)
Monocytes Relative: 7 %
Neutro Abs: 2.5 10*3/uL (ref 1.7–7.7)
Neutrophils Relative %: 14 %
Platelet Count: 123 10*3/uL — ABNORMAL LOW (ref 150–400)
RBC: 3.04 MIL/uL — ABNORMAL LOW (ref 3.87–5.11)
RDW: 13.7 % (ref 11.5–15.5)
Smear Review: NORMAL
WBC Count: 17.6 10*3/uL — ABNORMAL HIGH (ref 4.0–10.5)
nRBC: 0 % (ref 0.0–0.2)

## 2021-06-07 LAB — LACTATE DEHYDROGENASE: LDH: 179 U/L (ref 98–192)

## 2021-06-10 DIAGNOSIS — M1711 Unilateral primary osteoarthritis, right knee: Secondary | ICD-10-CM | POA: Diagnosis not present

## 2021-06-10 DIAGNOSIS — M25561 Pain in right knee: Secondary | ICD-10-CM | POA: Diagnosis not present

## 2021-06-13 NOTE — Progress Notes (Signed)
HEMATOLOGY/ONCOLOGY CLINIC NOTE  Date of Service.06/07/2021   Patient Care Team: Shon Baton, MD as PCP - General (Internal Medicine) Izora Gala, MD as Consulting Physician (Otolaryngology) Eppie Gibson, MD as Attending Physician (Radiation Oncology) Brunetta Genera, MD as Consulting Physician (Hematology)  CHIEF COMPLAINTS/PURPOSE OF CONSULTATION:  Follow-up for continued evaluation of CLL/SLL  HISTORY OF PRESENTING ILLNESS:  See previous note for details of initial presentation  INTERVAL HISTORY:  Mary Kim is a wonderful 88 year old female here for follow-up of her CLL/SLL. Patient notes no acute new symptoms since her last clinic visit.  No new lumps or bumps.  No fevers no chills no night sweats no unexpected weight loss. Some dyspnea on exertion. No other acute new symptoms   MEDICAL HISTORY:  Past Medical History:  Diagnosis Date   Chronic cough    Elevated cholesterol    GERD (gastroesophageal reflux disease)    Hearing loss    left hearing aid   Hyperlipidemia    Hypertension    Lymphoma ([place])    Osteoporosis     SURGICAL HISTORY: Past Surgical History:  Procedure Laterality Date   CATARACT EXTRACTION  2011   LYMPH NODE BIOPSY Left 01/10/2019   Procedure: EXCISIONAL BIOPSY OF CERVICAL LYMPH NODE/LEFT;  Surgeon: Izora Gala, MD;  Location: [facility];  Service: ENT;  Laterality: Left;    SOCIAL HISTORY: Social History   Socioeconomic History   Marital status: Widowed    Spouse name: Not on file   Number of children: Not on file   Years of education: Not on file   Highest education level: Not on file  Occupational History   Not on file  Tobacco Use   Smoking status: Never   Smokeless tobacco: Never  Vaping Use   Vaping Use: Never used  Substance and Sexual Activity   Alcohol use: No   Drug use: No   Sexual activity: Never  Other Topics Concern   Not on file  Social History Narrative   ** Merged History  Encounter    Pt states recent bronchial diagnosis from Dr Melvyn Novas but unable to recall specific        Social Determinants of Health   Financial Resource Strain: Not on file  Food Insecurity: Not on file  Transportation Needs: Not on file  Physical Activity: Not on file  Stress: Not on file  Social Connections: Not on file  Intimate Partner Violence: Not on file    FAMILY HISTORY: Family History  Problem Relation Age of Onset   Breast cancer Neg Hx     ALLERGIES:  has No Known Allergies.  MEDICATIONS:  Current Outpatient Medications  Medication Sig Dispense Refill   albuterol (PROVENTIL) (2.5 MG/3ML) 0.083% nebulizer solution Take 3 mLs (2.5 mg total) by nebulization every 4 (four) hours as needed for wheezing or shortness of breath. 75 mL 12   amLODipine (NORVASC) 5 MG tablet Take 5 mg by mouth every morning.     amoxicillin-clavulanate (AUGMENTIN) 875-125 MG tablet Take 1 tablet by mouth every 12 (twelve) hours. 14 tablet 0   azithromycin (ZITHROMAX) 250 MG tablet Take 1 tablet (250 mg total) by mouth daily. 4 tablet 0   Calcium Carbonate-Vitamin D 600-400 MG-UNIT tablet Take 1 tablet by mouth daily.     cholecalciferol (VITAMIN D) 1000 UNITS tablet Take 1,000 Units by mouth daily.     clotrimazole (LOTRIMIN) 1 % cream APPLY CREAM TOPICALLY TO FUNGAL RASH OVER CHEST ALL AND UNDER BREAST.  15 g 0   denosumab (PROLIA) 60 MG/ML SOLN injection Inject 60 mg into the skin every 6 (six) months. Administer in upper arm, thigh, or abdomen     fluticasone (FLONASE) 50 MCG/ACT nasal spray Place 2 sprays into both nostrils 2 (two) times daily. (Patient taking differently: Place 2 sprays into both nostrils 2 (two) times daily as needed for allergies or rhinitis (congestion).) 16 g 3   guaiFENesin (MUCINEX) 600 MG 12 hr tablet Take 1 tablet (600 mg total) by mouth 2 (two) times daily. 30 tablet 0   latanoprost (XALATAN) 0.005 % ophthalmic solution Place 1 drop into both eyes at bedtime.       Multiple Vitamins-Minerals (PRESERVISION AREDS 2) CAPS Take 1 capsule by mouth 2 (two) times daily.     omeprazole (PRILOSEC OTC) 20 MG tablet Take 20 mg by mouth daily.     Respiratory Therapy Supplies (FLUTTER) DEVI Use as directed 1 each 0   sodium chloride HYPERTONIC 3 % nebulizer solution Take two times daily prior to Flutter Valve use (Patient taking differently: Take 4 mLs by nebulization 2 (two) times daily. Take two times daily prior to Flutter Valve use) 750 mL 6   No current facility-administered medications for this visit.    REVIEW OF SYSTEMS:   10 Point review of Systems was done is negative except as noted above.  PHYSICAL EXAMINATION: ECOG PERFORMANCE STATUS: 0 - Asymptomatic  . Vitals:   06/07/21 0938  BP: (!) 162/86  Pulse: 76  Resp: 18  Temp: 97.7 F (36.5 C)  SpO2: 100%   Filed Weights   06/07/21 0938  Weight: 97 lb 4.8 oz (44.1 kg)   .Body mass index is 16.19 kg/m.   GENERAL:alert, in no acute distress and comfortable SKIN: erythema and satellite lesions along the chest and under breast EYES: conjunctiva are pink and non-injected, sclera anicteric OROPHARYNX: MMM, no exudates, no oropharyngeal erythema or ulceration NECK: supple, no JVD LYMPH:  no palpable lymphadenopathy in the axillary or inguinal regions. Several, small cervical lymph nodes. LUNGS: clear to auscultation b/l with normal respiratory effort HEART: regular rate & rhythm ABDOMEN:  normoactive bowel sounds , non tender, not distended. No palpable hepatosplenomegaly.  Extremity: no pedal edema PSYCH: alert & oriented x 3 with fluent speech NEURO: no focal motor/sensory deficits  LABORATORY DATA:  I have reviewed the data as listed  .    Latest Ref Rng & Units 06/07/2021    9:27 AM 08/21/2020    6:38 PM 08/04/2020    4:39 PM  CBC  WBC 4.0 - 10.5 K/uL 17.6   16.0   17.8    Hemoglobin 12.0 - 15.0 g/dL 10.9   10.3   10.6    Hematocrit 36.0 - 46.0 % 32.0   32.2   31.8    Platelets 150  - 400 K/uL 123   175   296      .    Latest Ref Rng & Units 06/07/2021    9:27 AM 08/21/2020    1:52 PM 08/04/2020    4:39 PM  CMP  Glucose 70 - 99 mg/dL 101   94   103    BUN 8 - 23 mg/dL '27   18   31    '$ Creatinine 0.44 - 1.00 mg/dL 0.87   0.70   0.84    Sodium 135 - 145 mmol/L 130   133   131    Potassium 3.5 - 5.1 mmol/L 4.8  4.1   5.0    Chloride 98 - 111 mmol/L 97   100   97    CO2 22 - 32 mmol/L '27   27   25    '$ Calcium 8.9 - 10.3 mg/dL 9.5   9.2   9.5    Total Protein 6.5 - 8.1 g/dL 7.0   6.5   7.0    Total Bilirubin 0.3 - 1.2 mg/dL 0.5   0.8   0.7    Alkaline Phos 38 - 126 U/L 71   63   92    AST 15 - 41 U/L '24   24   26    '$ ALT 0 - 44 U/L '22   18   26     '$ 06/08/2019 FISH - CLL Prognosis Panel:   01/10/2019 Surgical Pathology Report ([id number]):   01/10/2019 Flow Pathology Report ([id number]):   RADIOGRAPHIC STUDIES: I have personally reviewed the radiological images as listed and agreed with the findings in the report. No results found.  ASSESSMENT & PLAN:   88 yo with  t 1) Recenly diagnosed CLL/SLL -12/08/2018 CT Soft Tissue Neck ([id number]) revealed "Cervical lymphadenopathy at both the upper and lower nodal stations bilaterally. An index left level 2 lymph node measures 2.4 x 2.0 cm, likely accounting for the palpable submandibular abnormality. Findings are highly suspicious for a lymphoproliferative process or metastatic nodal disease." -01/10/2019 Surgical Pathology Report ([id number]) revealed "Chronic lymphocytic leukemia/small lymphocytic lymphoma."  -01/10/2019 Flow Pathology ([id number]) revealed "Monoclonal B-cell population identified"   PLAN: -patient's labs today were discussed in detail with her -Labs show stable hemoglobin of 10.9 with a WBC count of 17.6k and platelets of 123k -CMP stable except chronic hyponatremia with sodium of 130 LDH within normal limits at 179  Patient does not have any clinical or labs findings suggestive  of significant CLL progression requiring treatment at this time.   FOLLOW UP: Return to clinic with Dr. Irene Limbo with labs in 6 months   The total time spent in the appointment was 20 minutes*.  All of the patient's questions were answered with apparent satisfaction. The patient knows to call the clinic with any problems, questions or concerns.   Sullivan Lone MD MS AAHIVMS [facility] Hematology/Oncology Physician [facility]  .*Total Encounter Time as defined by the Centers for Medicare and Medicaid Services includes, in addition to the face-to-face time of a patient visit (documented in the note above) non-face-to-face time: obtaining and reviewing outside history, ordering and reviewing medications, tests or procedures, care coordination (communications with other health care professionals or caregivers) and documentation in the medical record.

## 2021-06-17 DIAGNOSIS — C9110 Chronic lymphocytic leukemia of B-cell type not having achieved remission: Secondary | ICD-10-CM | POA: Diagnosis not present

## 2021-06-17 DIAGNOSIS — I129 Hypertensive chronic kidney disease with stage 1 through stage 4 chronic kidney disease, or unspecified chronic kidney disease: Secondary | ICD-10-CM | POA: Diagnosis not present

## 2021-06-17 DIAGNOSIS — C649 Malignant neoplasm of unspecified kidney, except renal pelvis: Secondary | ICD-10-CM | POA: Diagnosis not present

## 2021-06-17 DIAGNOSIS — K5900 Constipation, unspecified: Secondary | ICD-10-CM | POA: Diagnosis not present

## 2021-06-17 DIAGNOSIS — D692 Other nonthrombocytopenic purpura: Secondary | ICD-10-CM | POA: Diagnosis not present

## 2021-06-17 DIAGNOSIS — E46 Unspecified protein-calorie malnutrition: Secondary | ICD-10-CM | POA: Diagnosis not present

## 2021-06-17 DIAGNOSIS — I872 Venous insufficiency (chronic) (peripheral): Secondary | ICD-10-CM | POA: Diagnosis not present

## 2021-06-17 DIAGNOSIS — I454 Nonspecific intraventricular block: Secondary | ICD-10-CM | POA: Diagnosis not present

## 2021-06-17 DIAGNOSIS — D72829 Elevated white blood cell count, unspecified: Secondary | ICD-10-CM | POA: Diagnosis not present

## 2021-06-17 DIAGNOSIS — E785 Hyperlipidemia, unspecified: Secondary | ICD-10-CM | POA: Diagnosis not present

## 2021-06-17 DIAGNOSIS — N1831 Chronic kidney disease, stage 3a: Secondary | ICD-10-CM | POA: Diagnosis not present

## 2021-06-17 DIAGNOSIS — I708 Atherosclerosis of other arteries: Secondary | ICD-10-CM | POA: Diagnosis not present

## 2021-06-18 DIAGNOSIS — M1711 Unilateral primary osteoarthritis, right knee: Secondary | ICD-10-CM | POA: Diagnosis not present

## 2021-06-18 DIAGNOSIS — M25561 Pain in right knee: Secondary | ICD-10-CM | POA: Diagnosis not present

## 2021-06-18 DIAGNOSIS — R2681 Unsteadiness on feet: Secondary | ICD-10-CM | POA: Diagnosis not present

## 2021-06-18 DIAGNOSIS — R2689 Other abnormalities of gait and mobility: Secondary | ICD-10-CM | POA: Diagnosis not present

## 2021-06-18 DIAGNOSIS — M6281 Muscle weakness (generalized): Secondary | ICD-10-CM | POA: Diagnosis not present

## 2021-06-18 DIAGNOSIS — M25562 Pain in left knee: Secondary | ICD-10-CM | POA: Diagnosis not present

## 2021-06-19 ENCOUNTER — Other Ambulatory Visit: Payer: Self-pay | Admitting: Hematology

## 2021-06-20 DIAGNOSIS — R2689 Other abnormalities of gait and mobility: Secondary | ICD-10-CM | POA: Diagnosis not present

## 2021-06-20 DIAGNOSIS — R2681 Unsteadiness on feet: Secondary | ICD-10-CM | POA: Diagnosis not present

## 2021-06-20 DIAGNOSIS — M6281 Muscle weakness (generalized): Secondary | ICD-10-CM | POA: Diagnosis not present

## 2021-06-20 DIAGNOSIS — M1711 Unilateral primary osteoarthritis, right knee: Secondary | ICD-10-CM | POA: Diagnosis not present

## 2021-06-20 DIAGNOSIS — M25562 Pain in left knee: Secondary | ICD-10-CM | POA: Diagnosis not present

## 2021-06-20 DIAGNOSIS — M25561 Pain in right knee: Secondary | ICD-10-CM | POA: Diagnosis not present

## 2021-06-20 MED ORDER — CLOTRIMAZOLE 1 % EX CREA: TOPICAL_CREAM | Freq: Two times a day (BID) | CUTANEOUS | 0 refills | Status: AC

## 2021-06-21 DIAGNOSIS — H6983 Other specified disorders of Eustachian tube, bilateral: Secondary | ICD-10-CM | POA: Diagnosis not present

## 2021-06-21 DIAGNOSIS — H903 Sensorineural hearing loss, bilateral: Secondary | ICD-10-CM | POA: Diagnosis not present

## 2021-06-25 DIAGNOSIS — R2681 Unsteadiness on feet: Secondary | ICD-10-CM | POA: Diagnosis not present

## 2021-06-25 DIAGNOSIS — M6281 Muscle weakness (generalized): Secondary | ICD-10-CM | POA: Diagnosis not present

## 2021-06-25 DIAGNOSIS — M25561 Pain in right knee: Secondary | ICD-10-CM | POA: Diagnosis not present

## 2021-06-25 DIAGNOSIS — M1711 Unilateral primary osteoarthritis, right knee: Secondary | ICD-10-CM | POA: Diagnosis not present

## 2021-06-25 DIAGNOSIS — M25562 Pain in left knee: Secondary | ICD-10-CM | POA: Diagnosis not present

## 2021-06-25 DIAGNOSIS — R2689 Other abnormalities of gait and mobility: Secondary | ICD-10-CM | POA: Diagnosis not present

## 2021-06-28 DIAGNOSIS — M1711 Unilateral primary osteoarthritis, right knee: Secondary | ICD-10-CM | POA: Diagnosis not present

## 2021-06-28 DIAGNOSIS — R2689 Other abnormalities of gait and mobility: Secondary | ICD-10-CM | POA: Diagnosis not present

## 2021-06-28 DIAGNOSIS — M25562 Pain in left knee: Secondary | ICD-10-CM | POA: Diagnosis not present

## 2021-06-28 DIAGNOSIS — M25561 Pain in right knee: Secondary | ICD-10-CM | POA: Diagnosis not present

## 2021-06-28 DIAGNOSIS — R2681 Unsteadiness on feet: Secondary | ICD-10-CM | POA: Diagnosis not present

## 2021-06-28 DIAGNOSIS — M6281 Muscle weakness (generalized): Secondary | ICD-10-CM | POA: Diagnosis not present

## 2021-07-02 DIAGNOSIS — R2681 Unsteadiness on feet: Secondary | ICD-10-CM | POA: Diagnosis not present

## 2021-07-02 DIAGNOSIS — M1711 Unilateral primary osteoarthritis, right knee: Secondary | ICD-10-CM | POA: Diagnosis not present

## 2021-07-02 DIAGNOSIS — M25561 Pain in right knee: Secondary | ICD-10-CM | POA: Diagnosis not present

## 2021-07-02 DIAGNOSIS — R2689 Other abnormalities of gait and mobility: Secondary | ICD-10-CM | POA: Diagnosis not present

## 2021-07-02 DIAGNOSIS — M6281 Muscle weakness (generalized): Secondary | ICD-10-CM | POA: Diagnosis not present

## 2021-07-02 DIAGNOSIS — M25562 Pain in left knee: Secondary | ICD-10-CM | POA: Diagnosis not present

## 2021-07-04 DIAGNOSIS — M1711 Unilateral primary osteoarthritis, right knee: Secondary | ICD-10-CM | POA: Diagnosis not present

## 2021-07-04 DIAGNOSIS — M25562 Pain in left knee: Secondary | ICD-10-CM | POA: Diagnosis not present

## 2021-07-04 DIAGNOSIS — R2689 Other abnormalities of gait and mobility: Secondary | ICD-10-CM | POA: Diagnosis not present

## 2021-07-04 DIAGNOSIS — R2681 Unsteadiness on feet: Secondary | ICD-10-CM | POA: Diagnosis not present

## 2021-07-04 DIAGNOSIS — M6281 Muscle weakness (generalized): Secondary | ICD-10-CM | POA: Diagnosis not present

## 2021-07-04 DIAGNOSIS — M25561 Pain in right knee: Secondary | ICD-10-CM | POA: Diagnosis not present

## 2021-07-09 DIAGNOSIS — M1711 Unilateral primary osteoarthritis, right knee: Secondary | ICD-10-CM | POA: Diagnosis not present

## 2021-07-09 DIAGNOSIS — M25561 Pain in right knee: Secondary | ICD-10-CM | POA: Diagnosis not present

## 2021-07-09 DIAGNOSIS — M6281 Muscle weakness (generalized): Secondary | ICD-10-CM | POA: Diagnosis not present

## 2021-07-09 DIAGNOSIS — R2681 Unsteadiness on feet: Secondary | ICD-10-CM | POA: Diagnosis not present

## 2021-07-09 DIAGNOSIS — M25562 Pain in left knee: Secondary | ICD-10-CM | POA: Diagnosis not present

## 2021-07-09 DIAGNOSIS — R2689 Other abnormalities of gait and mobility: Secondary | ICD-10-CM | POA: Diagnosis not present

## 2021-07-11 ENCOUNTER — Other Ambulatory Visit (HOSPITAL_COMMUNITY): Payer: Self-pay | Admitting: *Deleted

## 2021-07-11 DIAGNOSIS — R2689 Other abnormalities of gait and mobility: Secondary | ICD-10-CM | POA: Diagnosis not present

## 2021-07-11 DIAGNOSIS — M1711 Unilateral primary osteoarthritis, right knee: Secondary | ICD-10-CM | POA: Diagnosis not present

## 2021-07-11 DIAGNOSIS — M6281 Muscle weakness (generalized): Secondary | ICD-10-CM | POA: Diagnosis not present

## 2021-07-11 DIAGNOSIS — M25561 Pain in right knee: Secondary | ICD-10-CM | POA: Diagnosis not present

## 2021-07-11 DIAGNOSIS — R2681 Unsteadiness on feet: Secondary | ICD-10-CM | POA: Diagnosis not present

## 2021-07-11 DIAGNOSIS — M25562 Pain in left knee: Secondary | ICD-10-CM | POA: Diagnosis not present

## 2021-07-12 ENCOUNTER — Encounter (HOSPITAL_COMMUNITY)
Admission: RE | Admit: 2021-07-12 | Discharge: 2021-07-12 | Disposition: A | Discharge status: Home / Self Care | Payer: Medicare Other | Source: Ambulatory Visit | Attending: Internal Medicine | Admitting: Internal Medicine

## 2021-07-12 DIAGNOSIS — M810 Age-related osteoporosis without current pathological fracture: Secondary | ICD-10-CM | POA: Insufficient documentation

## 2021-07-12 MED ORDER — DENOSUMAB 60 MG/ML ~~LOC~~ SOSY
PREFILLED_SYRINGE | SUBCUTANEOUS | Status: AC
  Filled 2021-07-12: qty 1

## 2021-07-12 MED ORDER — DENOSUMAB 60 MG/ML ~~LOC~~ SOSY
60.0000 mg | PREFILLED_SYRINGE | Freq: Once | SUBCUTANEOUS | Status: AC
  Administered 2021-07-12: 60 mg via SUBCUTANEOUS

## 2021-07-18 DIAGNOSIS — R2689 Other abnormalities of gait and mobility: Secondary | ICD-10-CM | POA: Diagnosis not present

## 2021-07-18 DIAGNOSIS — R2681 Unsteadiness on feet: Secondary | ICD-10-CM | POA: Diagnosis not present

## 2021-07-18 DIAGNOSIS — M6281 Muscle weakness (generalized): Secondary | ICD-10-CM | POA: Diagnosis not present

## 2021-07-18 DIAGNOSIS — M25561 Pain in right knee: Secondary | ICD-10-CM | POA: Diagnosis not present

## 2021-07-18 DIAGNOSIS — M1711 Unilateral primary osteoarthritis, right knee: Secondary | ICD-10-CM | POA: Diagnosis not present

## 2021-07-18 DIAGNOSIS — M25562 Pain in left knee: Secondary | ICD-10-CM | POA: Diagnosis not present

## 2021-07-20 ENCOUNTER — Emergency Department (HOSPITAL_COMMUNITY)
Admission: EM | Admit: 2021-07-20 | Discharge: 2021-07-20 | Disposition: A | Discharge status: Home / Self Care | Payer: Medicare Other | Attending: Emergency Medicine | Admitting: Emergency Medicine

## 2021-07-20 ENCOUNTER — Emergency Department (HOSPITAL_COMMUNITY): Payer: Medicare Other

## 2021-07-20 ENCOUNTER — Encounter (HOSPITAL_COMMUNITY): Payer: Self-pay

## 2021-07-20 ENCOUNTER — Other Ambulatory Visit: Payer: Self-pay

## 2021-07-20 DIAGNOSIS — W182XXA Fall in (into) shower or empty bathtub, initial encounter: Secondary | ICD-10-CM | POA: Diagnosis not present

## 2021-07-20 DIAGNOSIS — T148XXA Other injury of unspecified body region, initial encounter: Secondary | ICD-10-CM

## 2021-07-20 DIAGNOSIS — M79605 Pain in left leg: Secondary | ICD-10-CM | POA: Diagnosis not present

## 2021-07-20 DIAGNOSIS — R102 Pelvic and perineal pain: Secondary | ICD-10-CM | POA: Diagnosis not present

## 2021-07-20 DIAGNOSIS — Y92009 Unspecified place in unspecified non-institutional (private) residence as the place of occurrence of the external cause: Secondary | ICD-10-CM | POA: Diagnosis not present

## 2021-07-20 DIAGNOSIS — S8992XA Unspecified injury of left lower leg, initial encounter: Secondary | ICD-10-CM | POA: Diagnosis present

## 2021-07-20 DIAGNOSIS — S81812A Laceration without foreign body, left lower leg, initial encounter: Secondary | ICD-10-CM | POA: Diagnosis not present

## 2021-07-20 DIAGNOSIS — I10 Essential (primary) hypertension: Secondary | ICD-10-CM | POA: Diagnosis not present

## 2021-07-20 DIAGNOSIS — W19XXXA Unspecified fall, initial encounter: Secondary | ICD-10-CM | POA: Diagnosis not present

## 2021-07-20 DIAGNOSIS — R58 Hemorrhage, not elsewhere classified: Secondary | ICD-10-CM | POA: Diagnosis not present

## 2021-07-20 DIAGNOSIS — M79662 Pain in left lower leg: Secondary | ICD-10-CM | POA: Diagnosis not present

## 2021-07-20 MED ORDER — ACETAMINOPHEN 325 MG PO TABS
650.0000 mg | ORAL_TABLET | Freq: Once | ORAL | Status: AC
  Administered 2021-07-20: 650 mg via ORAL
  Filled 2021-07-20: qty 2

## 2021-07-24 DIAGNOSIS — M79605 Pain in left leg: Secondary | ICD-10-CM | POA: Diagnosis not present

## 2021-07-24 DIAGNOSIS — W19XXXA Unspecified fall, initial encounter: Secondary | ICD-10-CM | POA: Diagnosis not present

## 2021-07-24 DIAGNOSIS — R269 Unspecified abnormalities of gait and mobility: Secondary | ICD-10-CM | POA: Diagnosis not present

## 2021-07-24 DIAGNOSIS — S81812A Laceration without foreign body, left lower leg, initial encounter: Secondary | ICD-10-CM | POA: Diagnosis not present

## 2021-07-29 DIAGNOSIS — M79605 Pain in left leg: Secondary | ICD-10-CM | POA: Diagnosis not present

## 2021-07-29 DIAGNOSIS — R269 Unspecified abnormalities of gait and mobility: Secondary | ICD-10-CM | POA: Diagnosis not present

## 2021-07-29 DIAGNOSIS — S81812D Laceration without foreign body, left lower leg, subsequent encounter: Secondary | ICD-10-CM | POA: Diagnosis not present

## 2021-07-29 DIAGNOSIS — W19XXXA Unspecified fall, initial encounter: Secondary | ICD-10-CM | POA: Diagnosis not present

## 2021-07-31 DIAGNOSIS — L570 Actinic keratosis: Secondary | ICD-10-CM | POA: Diagnosis not present

## 2021-07-31 DIAGNOSIS — B354 Tinea corporis: Secondary | ICD-10-CM | POA: Diagnosis not present

## 2021-07-31 DIAGNOSIS — L0889 Other specified local infections of the skin and subcutaneous tissue: Secondary | ICD-10-CM | POA: Diagnosis not present

## 2021-07-31 DIAGNOSIS — Z85828 Personal history of other malignant neoplasm of skin: Secondary | ICD-10-CM | POA: Diagnosis not present

## 2021-07-31 DIAGNOSIS — L821 Other seborrheic keratosis: Secondary | ICD-10-CM | POA: Diagnosis not present

## 2021-07-31 DIAGNOSIS — L97922 Non-pressure chronic ulcer of unspecified part of left lower leg with fat layer exposed: Secondary | ICD-10-CM | POA: Diagnosis not present

## 2021-08-01 DIAGNOSIS — R269 Unspecified abnormalities of gait and mobility: Secondary | ICD-10-CM | POA: Diagnosis not present

## 2021-08-01 DIAGNOSIS — S81812D Laceration without foreign body, left lower leg, subsequent encounter: Secondary | ICD-10-CM | POA: Diagnosis not present

## 2021-08-01 DIAGNOSIS — M79605 Pain in left leg: Secondary | ICD-10-CM | POA: Diagnosis not present

## 2021-08-05 DIAGNOSIS — R269 Unspecified abnormalities of gait and mobility: Secondary | ICD-10-CM | POA: Diagnosis not present

## 2021-08-05 DIAGNOSIS — S81812D Laceration without foreign body, left lower leg, subsequent encounter: Secondary | ICD-10-CM | POA: Diagnosis not present

## 2021-08-05 DIAGNOSIS — M79605 Pain in left leg: Secondary | ICD-10-CM | POA: Diagnosis not present

## 2021-08-05 DIAGNOSIS — L03116 Cellulitis of left lower limb: Secondary | ICD-10-CM | POA: Diagnosis not present

## 2021-08-08 DIAGNOSIS — L03116 Cellulitis of left lower limb: Secondary | ICD-10-CM | POA: Diagnosis not present

## 2021-08-08 DIAGNOSIS — R269 Unspecified abnormalities of gait and mobility: Secondary | ICD-10-CM | POA: Diagnosis not present

## 2021-08-08 DIAGNOSIS — M79605 Pain in left leg: Secondary | ICD-10-CM | POA: Diagnosis not present

## 2021-08-08 DIAGNOSIS — S81812D Laceration without foreign body, left lower leg, subsequent encounter: Secondary | ICD-10-CM | POA: Diagnosis not present

## 2021-08-20 DIAGNOSIS — R2681 Unsteadiness on feet: Secondary | ICD-10-CM | POA: Diagnosis not present

## 2021-08-20 DIAGNOSIS — M1711 Unilateral primary osteoarthritis, right knee: Secondary | ICD-10-CM | POA: Diagnosis not present

## 2021-08-20 DIAGNOSIS — M6281 Muscle weakness (generalized): Secondary | ICD-10-CM | POA: Diagnosis not present

## 2021-08-20 DIAGNOSIS — M25562 Pain in left knee: Secondary | ICD-10-CM | POA: Diagnosis not present

## 2021-08-20 DIAGNOSIS — M25561 Pain in right knee: Secondary | ICD-10-CM | POA: Diagnosis not present

## 2021-08-20 DIAGNOSIS — R2689 Other abnormalities of gait and mobility: Secondary | ICD-10-CM | POA: Diagnosis not present

## 2021-08-26 ENCOUNTER — Emergency Department (HOSPITAL_BASED_OUTPATIENT_CLINIC_OR_DEPARTMENT_OTHER): Payer: Medicare Other

## 2021-08-26 ENCOUNTER — Other Ambulatory Visit: Payer: Self-pay

## 2021-08-26 ENCOUNTER — Encounter (HOSPITAL_BASED_OUTPATIENT_CLINIC_OR_DEPARTMENT_OTHER): Payer: Self-pay | Admitting: Emergency Medicine

## 2021-08-26 ENCOUNTER — Emergency Department (HOSPITAL_BASED_OUTPATIENT_CLINIC_OR_DEPARTMENT_OTHER)
Admission: EM | Admit: 2021-08-26 | Discharge: 2021-08-26 | Disposition: A | Discharge status: Home / Self Care | Payer: Medicare Other | Attending: Emergency Medicine | Admitting: Emergency Medicine

## 2021-08-26 DIAGNOSIS — R109 Unspecified abdominal pain: Secondary | ICD-10-CM | POA: Diagnosis not present

## 2021-08-26 DIAGNOSIS — D72829 Elevated white blood cell count, unspecified: Secondary | ICD-10-CM | POA: Diagnosis not present

## 2021-08-26 DIAGNOSIS — Z79899 Other long term (current) drug therapy: Secondary | ICD-10-CM | POA: Diagnosis not present

## 2021-08-26 DIAGNOSIS — R1031 Right lower quadrant pain: Secondary | ICD-10-CM | POA: Insufficient documentation

## 2021-08-26 DIAGNOSIS — I10 Essential (primary) hypertension: Secondary | ICD-10-CM | POA: Diagnosis not present

## 2021-08-26 DIAGNOSIS — Z8572 Personal history of non-Hodgkin lymphomas: Secondary | ICD-10-CM | POA: Insufficient documentation

## 2021-08-26 LAB — COMPREHENSIVE METABOLIC PANEL
ALT: 13 U/L (ref 0–44)
AST: 17 U/L (ref 15–41)
Albumin: 3.9 g/dL (ref 3.5–5.0)
Alkaline Phosphatase: 48 U/L (ref 38–126)
Anion gap: 9 (ref 5–15)
BUN: 32 mg/dL — ABNORMAL HIGH (ref 8–23)
CO2: 24 mmol/L (ref 22–32)
Calcium: 9.2 mg/dL (ref 8.9–10.3)
Chloride: 99 mmol/L (ref 98–111)
Creatinine, Ser: 0.95 mg/dL (ref 0.44–1.00)
GFR, Estimated: 55 mL/min — ABNORMAL LOW (ref >60)
Glucose, Bld: 101 mg/dL — ABNORMAL HIGH (ref 70–99)
Potassium: 4.2 mmol/L (ref 3.5–5.1)
Sodium: 132 mmol/L — ABNORMAL LOW (ref 135–145)
Total Bilirubin: 0.5 mg/dL (ref 0.3–1.2)
Total Protein: 6.3 g/dL — ABNORMAL LOW (ref 6.5–8.1)

## 2021-08-26 LAB — CBC WITH DIFFERENTIAL/PLATELET
Abs Immature Granulocytes: 0.02 10*3/uL (ref 0.00–0.07)
Basophils Absolute: 0.1 10*3/uL (ref 0.0–0.1)
Basophils Relative: 0 %
Eosinophils Absolute: 0.1 10*3/uL (ref 0.0–0.5)
Eosinophils Relative: 1 %
HCT: 30.8 % — ABNORMAL LOW (ref 36.0–46.0)
Hemoglobin: 10.5 g/dL — ABNORMAL LOW (ref 12.0–15.0)
Immature Granulocytes: 0 %
Lymphocytes Relative: 76 %
Lymphs Abs: 10.5 10*3/uL — ABNORMAL HIGH (ref 0.7–4.0)
MCH: 36.5 pg — ABNORMAL HIGH (ref 26.0–34.0)
MCHC: 34.1 g/dL (ref 30.0–36.0)
MCV: 106.9 fL — ABNORMAL HIGH (ref 80.0–100.0)
Monocytes Absolute: 1.1 10*3/uL — ABNORMAL HIGH (ref 0.1–1.0)
Monocytes Relative: 8 %
Neutro Abs: 2 10*3/uL (ref 1.7–7.7)
Neutrophils Relative %: 15 %
Platelets: 135 10*3/uL — ABNORMAL LOW (ref 150–400)
RBC: 2.88 MIL/uL — ABNORMAL LOW (ref 3.87–5.11)
RDW: 14.1 % (ref 11.5–15.5)
WBC: 13.7 10*3/uL — ABNORMAL HIGH (ref 4.0–10.5)
nRBC: 0 % (ref 0.0–0.2)

## 2021-08-26 LAB — OCCULT BLOOD X 1 CARD TO LAB, STOOL: Fecal Occult Bld: NEGATIVE

## 2021-08-26 LAB — URINALYSIS, ROUTINE W REFLEX MICROSCOPIC
Bilirubin Urine: NEGATIVE
Glucose, UA: NEGATIVE mg/dL
Hgb urine dipstick: NEGATIVE
Ketones, ur: NEGATIVE mg/dL
Leukocytes,Ua: NEGATIVE
Nitrite: NEGATIVE
Protein, ur: NEGATIVE mg/dL
Specific Gravity, Urine: 1.02 (ref 1.005–1.030)
pH: 6.5 (ref 5.0–8.0)

## 2021-08-26 LAB — LIPASE, BLOOD: Lipase: 25 U/L (ref 11–51)

## 2021-08-26 MED ORDER — POLYETHYLENE GLYCOL 3350 17 G PO PACK: 17.0000 g | PACK | Freq: Every day | ORAL | 0 refills | Status: AC

## 2021-08-26 MED ORDER — IOHEXOL 300 MG/ML  SOLN
100.0000 mL | Freq: Once | INTRAMUSCULAR | Status: AC | PRN
  Administered 2021-08-26: 75 mL via INTRAVENOUS

## 2021-08-26 NOTE — ED Provider Notes (Signed)
[facility] DEPT Provider Note   CSN: [id number] Arrival date & time: 08/26/21  1143     History  Chief Complaint  Patient presents with   Abdominal Pain    Mary Kim is a 88 y.o. female.  Patient with a history of lymphoma in remission, hypertension, hyperlipidemia, GERD presenting with intermittent right-sided abdominal pain.  States the pain has been coming and going for the past 1 month that is seems to be worse in the morning.  The pain lasts about 15 to 20 minutes and is resolved with eating and resolved with having a bowel movement.  She is not having the pain currently.  Patient reports she is not having the pain currently but is having it several days a week only in the morning that only last for few minutes at a time.  She became concerned today because her stools are becoming more dark in color.  Does not take iron or blood thinners.  Normal colonoscopy several years ago.  No chest pain or shortness of breath.  No pain with urination or blood in the urine.  No fever.  No previous abdominal surgeries  The history is provided by the patient.  Abdominal Pain Associated symptoms: no cough, no dysuria, no hematuria, no nausea, no shortness of breath and no vomiting        Home Medications Prior to Admission medications   Medication Sig Start Date End Date Taking? Authorizing Provider  albuterol (PROVENTIL) (2.5 MG/3ML) 0.083% nebulizer solution Take 3 mLs (2.5 mg total) by nebulization every 4 (four) hours as needed for wheezing or shortness of breath. 08/24/20   Tanda Rockers, MD  amLODipine (NORVASC) 10 MG tablet Take 10 mg by mouth daily. 06/01/21   [provider]  amLODipine (NORVASC) 5 MG tablet Take 5 mg by mouth every morning. 07/01/20   [provider]  amoxicillin-clavulanate (AUGMENTIN) 875-125 MG tablet Take 1 tablet by mouth every 12 (twelve) hours. 08/21/20   Little, Wenda Overland, MD  azithromycin (ZITHROMAX) 250 MG  tablet Take 1 tablet (250 mg total) by mouth daily. 08/22/20   Little, Wenda Overland, MD  Calcium Carbonate-Vitamin D 600-400 MG-UNIT tablet Take 1 tablet by mouth daily.    [provider]  cholecalciferol (VITAMIN D) 1000 UNITS tablet Take 1,000 Units by mouth daily.    [provider]  clotrimazole (LOTRIMIN) 1 % cream Apply topically 2 (two) times daily. 06/20/21   Brunetta Genera, MD  denosumab (PROLIA) 60 MG/ML SOLN injection Inject 60 mg into the skin every 6 (six) months. Administer in upper arm, thigh, or abdomen    [provider]  fluticasone (FLONASE) 50 MCG/ACT nasal spray Place 2 sprays into both nostrils 2 (two) times daily. Patient taking differently: Place 2 sprays into both nostrils 2 (two) times daily as needed for allergies or rhinitis (congestion). 11/10/17   Lauraine Rinne, NP  guaiFENesin (MUCINEX) 600 MG 12 hr tablet Take 1 tablet (600 mg total) by mouth 2 (two) times daily. 07/28/20   Volney American, PA-C  latanoprost (XALATAN) 0.005 % ophthalmic solution Place 1 drop into both eyes at bedtime.  11/10/18   [provider]  Multiple Vitamins-Minerals (PRESERVISION AREDS 2) CAPS Take 1 capsule by mouth 2 (two) times daily.    [provider]  omeprazole (PRILOSEC OTC) 20 MG tablet Take 20 mg by mouth daily.    [provider]  Respiratory Therapy Supplies (FLUTTER) DEVI Use as directed 07/01/17  Tanda Rockers, MD  sodium chloride HYPERTONIC 3 % nebulizer solution Take two times daily prior to Flutter Valve use Patient taking differently: Take 4 mLs by nebulization 2 (two) times daily. Take two times daily prior to Flutter Valve use 11/15/19   Tanda Rockers, MD      Allergies    Patient has no known allergies.    Review of Systems   Review of Systems  Constitutional:  Positive for activity change and appetite change.  Respiratory:  Negative for cough, chest tightness and shortness of breath.    Gastrointestinal:  Positive for abdominal pain and blood in stool. Negative for nausea and vomiting.  Genitourinary:  Negative for dysuria and hematuria.   all other systems are negative except as noted in the HPI and PMH. \  Physical Exam Updated Vital Signs BP 139/86 (BP Location: Right Arm)   Pulse 88   Temp 98.1 F (36.7 C)   Resp 16   Ht '5\' 4"'$  (1.626 m)   Wt 44 kg   SpO2 98%   BMI 16.65 kg/m  Physical Exam Vitals and nursing note reviewed.  Constitutional:      General: She is not in acute distress.    Appearance: She is well-developed.  HENT:     Head: Normocephalic and atraumatic.     Mouth/Throat:     Pharynx: No oropharyngeal exudate.  Eyes:     Conjunctiva/sclera: Conjunctivae normal.     Pupils: Pupils are equal, round, and reactive to light.  Neck:     Comments: No meningismus. Cardiovascular:     Rate and Rhythm: Normal rate and regular rhythm.     Heart sounds: Normal heart sounds. No murmur heard. Pulmonary:     Effort: Pulmonary effort is normal. No respiratory distress.     Breath sounds: Normal breath sounds.  Abdominal:     Palpations: Abdomen is soft.     Tenderness: There is abdominal tenderness. There is no guarding or rebound.     Comments: Soft, right upper and right lower quadrant tenderness without guarding or rebound.  Genitourinary:    Comments: Chaperone present Transport planner.  Small external hemorrhoids.  No gross blood or fissures. Musculoskeletal:        General: No tenderness. Normal range of motion.     Cervical back: Normal range of motion and neck supple.  Skin:    General: Skin is warm.  Neurological:     Mental Status: She is alert and oriented to person, place, and time.     Cranial Nerves: No cranial nerve deficit.     Motor: No abnormal muscle tone.     Coordination: Coordination normal.     Comments:  5/5 strength throughout. CN 2-12 intact.Equal grip strength.   Psychiatric:        Behavior: Behavior normal.     ED  Results / Procedures / Treatments   Labs (all labs ordered are listed, but only abnormal results are displayed) Labs Reviewed  COMPREHENSIVE METABOLIC PANEL - Abnormal; Notable for the following components:      Result Value   Sodium 132 (*)    Glucose, Bld 101 (*)    BUN 32 (*)    Total Protein 6.3 (*)    GFR, Estimated 55 (*)    All other components within normal limits  CBC WITH DIFFERENTIAL/PLATELET - Abnormal; Notable for the following components:   WBC 13.7 (*)    RBC 2.88 (*)    Hemoglobin 10.5 (*)  HCT 30.8 (*)    MCV 106.9 (*)    MCH 36.5 (*)    Platelets 135 (*)    Lymphs Abs 10.5 (*)    Monocytes Absolute 1.1 (*)    All other components within normal limits  URINALYSIS, ROUTINE W REFLEX MICROSCOPIC - Abnormal; Notable for the following components:   Color, Urine COLORLESS (*)    All other components within normal limits  LIPASE, BLOOD  OCCULT BLOOD X 1 CARD TO LAB, STOOL    EKG None  Radiology CT ABDOMEN PELVIS W CONTRAST  Result Date: 08/26/2021 CLINICAL DATA:  Right-sided abdominal pain since this morning Stool darkened in color EXAM: CT ABDOMEN AND PELVIS WITH CONTRAST TECHNIQUE: Multidetector CT imaging of the abdomen and pelvis was performed using the standard protocol following bolus administration of intravenous contrast. RADIATION DOSE REDUCTION: This exam was performed according to the departmental dose-optimization program which includes automated exposure control, adjustment of the mA and/or kV according to patient size and/or use of iterative reconstruction technique. CONTRAST:  42m OMNIPAQUE IOHEXOL 300 MG/ML  SOLN COMPARISON:  08/21/2020 FINDINGS: Lower chest: Mild emphysematous changes the lung bases. Heart size is within normal limits. Hepatobiliary: No focal liver abnormality is seen. No gallstones, gallbladder wall thickening, or biliary dilatation. Pancreas: Unremarkable. No pancreatic ductal dilatation or surrounding inflammatory changes. Spleen:  Normal in size without focal abnormality. Adrenals/Urinary Tract: Adrenal glands are normal. 2.1 cm solid mass again seen in the lateral cortex of the right kidney and remains suspicious for primary renal neoplasm. It has slightly increased in size since prior study from 08/01/2020. No hydronephrosis or hydroureter.  Normal bladder. Stomach/Bowel: Mild distal colonic diverticulosis without evidence of acute diverticulitis. No bowel dilatation to indicate ileus or obstruction. Appendix is normal. The colon is highly redundant with moderate to large stool burden. Vascular/Lymphatic: No enlarged abdominal or pelvic lymph nodes. Diffusely calcified abdominal aorta without aneurysmal dilatation. Reproductive: Uterus and bilateral adnexa are unremarkable. Other: No abdominal wall hernia or abnormality. No abdominopelvic ascites. Musculoskeletal: Unchanged mild compression deformity of the T12 vertebral body. Degenerative changes seen throughout the lumbar spine. No acute osseous abnormality. IMPRESSION: 1. No acute abnormality of the abdomen or pelvis. 2. Moderate colonic stool burden. 3. Slight interval increase in size of 2.1 cm solid right renal mass which remains suspicious for primary renal neoplasm. Electronically Signed   By: FMiachel RouxM.D.   On: 08/26/2021 14:28    Procedures Procedures    Medications Ordered in ED Medications - No data to display  ED Course/ Medical Decision Making/ A&P                           Medical Decision Making Amount and/or Complexity of Data Reviewed Labs: ordered. Radiology: ordered and independent interpretation performed. Decision-making details documented in ED Course. ECG/medicine tests: ordered and independent interpretation performed. Decision-making details documented in ED Course.  Risk Prescription drug management.  Right-sided abdominal pain intermittent for the past 1 month.  No previous abdominal surgeries.  Vital stable, no distress.  Abdomen soft  without peritoneal signs.  Hemoglobin is stable.  Leukocytosis appears to be chronic.  Hemoccult testing is negative.  CT scan obtained given her right-sided abdominal pain.  No acute pathology seen.  Does have moderate stool burden.  There is a right renal mass concerning for neoplasm Patient and family are aware of this finding and watching it with their primary doctor.  Patient not certain she would want  any kind of treatment.  Urinalysis is negative.  Will treat with bowel regimen.  Results discussed with patient and daughter at bedside.  They are aware of kidney mass and patient does not feel she will want further evaluation for this.  Ultrasound pending at shift change for further evaluation of gallbladder.  If this is reassuring plan discharge home with MiraLAX for constipation and PCP follow-up.  Abdomen soft without peritoneal signs.  Dr. Billy Fischer to see to assume care at shift change.        Final Clinical Impression(s) / ED Diagnoses Final diagnoses:  Right lower quadrant abdominal pain    Rx / DC Orders ED Discharge Orders     None         Keionna Kinnaird, Annie Main, MD 08/26/21 1511

## 2021-08-26 NOTE — Discharge Instructions (Addendum)
Your testing shows evidence of constipation without any acute surgical pathology.  Urinalysis is negative.  As we discussed you do have a mass on your kidney which is concerning for possible cancer.  If you wish to have this evaluated you should see the urologist otherwise follow-up with your primary care doctor regarding this to decide if you want have any treatment. It is slightly bigger compared to last year. Take the constipation medicine as prescribed.  Keep yourself hydrated.  Return to the ED with worsening pain, chest pain, shortness of breath, not able to eat or drink or other concerns.

## 2021-08-26 NOTE — ED Triage Notes (Signed)
Pt here from home with c/o right side abd pain usually first thing in the morning  also noticed her stool have been dark in color, no blood thinners

## 2021-08-28 ENCOUNTER — Telehealth: Payer: Self-pay | Admitting: *Deleted

## 2021-08-28 NOTE — Telephone Encounter (Signed)
        Patient  visited [facility]  on  08/26/2021 for Stomach pain .   Telephone encounter attempt :  1st left message  A HIPAA compliant voice message was left requesting a return call.  Instructed patient to call back at   Instructed patient to call back at [phone]  at their earliest convenience. .  [facility], [facility] [phone] [address] , [place] [address] Email : [email or web address]

## 2021-08-29 ENCOUNTER — Other Ambulatory Visit: Payer: Self-pay | Admitting: *Deleted

## 2021-08-29 MED ORDER — ALBUTEROL SULFATE (2.5 MG/3ML) 0.083% IN NEBU
2.5000 mg | INHALATION_SOLUTION | RESPIRATORY_TRACT | 5 refills | Status: AC | PRN
Start: 2021-08-29 — End: ?

## 2021-08-30 ENCOUNTER — Telehealth: Payer: Self-pay | Admitting: *Deleted

## 2021-08-30 NOTE — Telephone Encounter (Signed)
        Patient  visited [facility] on 08/26/2021  for covid   Telephone encounter attempt :  2  A HIPAA compliant voice message was left requesting a return call.  Instructed patient to call back at [phone].   [place] [phone] [address] , [place] [address] Email : [email or web address]

## 2021-08-30 NOTE — Telephone Encounter (Signed)
.  edp 

## 2021-09-02 ENCOUNTER — Telehealth: Payer: Self-pay | Admitting: *Deleted

## 2021-09-02 NOTE — Telephone Encounter (Signed)
        Patient  visited [facility] on 08/26/2021  for covid   Telephone encounter attempt :  3rd Left message 3x times will close follow up   A HIPAA compliant voice message was left requesting a return call.  Instructed patient to call back at [phone].  [place] [phone] [address] , [place] [address] Email : [email or web address]

## 2021-09-10 DIAGNOSIS — R2689 Other abnormalities of gait and mobility: Secondary | ICD-10-CM | POA: Diagnosis not present

## 2021-09-10 DIAGNOSIS — M6281 Muscle weakness (generalized): Secondary | ICD-10-CM | POA: Diagnosis not present

## 2021-09-10 DIAGNOSIS — M25562 Pain in left knee: Secondary | ICD-10-CM | POA: Diagnosis not present

## 2021-09-10 DIAGNOSIS — M1711 Unilateral primary osteoarthritis, right knee: Secondary | ICD-10-CM | POA: Diagnosis not present

## 2021-09-10 DIAGNOSIS — M25561 Pain in right knee: Secondary | ICD-10-CM | POA: Diagnosis not present

## 2021-09-10 DIAGNOSIS — R2681 Unsteadiness on feet: Secondary | ICD-10-CM | POA: Diagnosis not present

## 2021-09-18 DIAGNOSIS — H524 Presbyopia: Secondary | ICD-10-CM | POA: Diagnosis not present

## 2021-09-18 DIAGNOSIS — H401123 Primary open-angle glaucoma, left eye, severe stage: Secondary | ICD-10-CM | POA: Diagnosis not present

## 2021-09-18 DIAGNOSIS — H04123 Dry eye syndrome of bilateral lacrimal glands: Secondary | ICD-10-CM | POA: Diagnosis not present

## 2021-09-18 DIAGNOSIS — H353133 Nonexudative age-related macular degeneration, bilateral, advanced atrophic without subfoveal involvement: Secondary | ICD-10-CM | POA: Diagnosis not present

## 2021-09-18 DIAGNOSIS — H26493 Other secondary cataract, bilateral: Secondary | ICD-10-CM | POA: Diagnosis not present

## 2021-10-10 DIAGNOSIS — W19XXXA Unspecified fall, initial encounter: Secondary | ICD-10-CM | POA: Diagnosis not present

## 2021-10-10 DIAGNOSIS — M546 Pain in thoracic spine: Secondary | ICD-10-CM | POA: Diagnosis not present

## 2021-10-10 DIAGNOSIS — R0781 Pleurodynia: Secondary | ICD-10-CM | POA: Diagnosis not present

## 2021-10-14 DIAGNOSIS — C9110 Chronic lymphocytic leukemia of B-cell type not having achieved remission: Secondary | ICD-10-CM | POA: Diagnosis not present

## 2021-10-14 DIAGNOSIS — R269 Unspecified abnormalities of gait and mobility: Secondary | ICD-10-CM | POA: Diagnosis not present

## 2021-10-14 DIAGNOSIS — E46 Unspecified protein-calorie malnutrition: Secondary | ICD-10-CM | POA: Diagnosis not present

## 2021-10-14 DIAGNOSIS — R627 Adult failure to thrive: Secondary | ICD-10-CM | POA: Diagnosis not present

## 2021-10-14 DIAGNOSIS — R634 Abnormal weight loss: Secondary | ICD-10-CM | POA: Diagnosis not present

## 2021-10-14 DIAGNOSIS — M810 Age-related osteoporosis without current pathological fracture: Secondary | ICD-10-CM | POA: Diagnosis not present

## 2021-10-14 DIAGNOSIS — M179 Osteoarthritis of knee, unspecified: Secondary | ICD-10-CM | POA: Diagnosis not present

## 2021-10-23 DIAGNOSIS — S0990XA Unspecified injury of head, initial encounter: Secondary | ICD-10-CM | POA: Diagnosis not present

## 2021-10-23 DIAGNOSIS — I469 Cardiac arrest, cause unspecified: Secondary | ICD-10-CM | POA: Diagnosis not present

## 2021-10-23 DIAGNOSIS — W19XXXA Unspecified fall, initial encounter: Secondary | ICD-10-CM | POA: Diagnosis not present

## 2021-10-23 DIAGNOSIS — R55 Syncope and collapse: Secondary | ICD-10-CM | POA: Diagnosis not present

## 2021-10-27 DEATH — deceased

## 2021-12-06 ENCOUNTER — Other Ambulatory Visit: Payer: Self-pay | Admitting: *Deleted

## 2021-12-06 DIAGNOSIS — C9110 Chronic lymphocytic leukemia of B-cell type not having achieved remission: Secondary | ICD-10-CM

## 2021-12-07 ENCOUNTER — Encounter: Payer: Self-pay | Admitting: Hematology

## 2021-12-09 ENCOUNTER — Inpatient Hospital Stay: Payer: Commercial Indemnity | Admitting: Hematology

## 2021-12-09 ENCOUNTER — Inpatient Hospital Stay: Payer: Commercial Indemnity

## 2021-12-09 NOTE — Progress Notes (Shared)
HEMATOLOGY/ONCOLOGY CLINIC NOTE  Date of Service: 12/09/21   Patient Care Team: Shon Baton, MD as PCP - General (Internal Medicine) Izora Gala, MD as Consulting Physician (Otolaryngology) Eppie Gibson, MD as Attending Physician (Radiation Oncology) Brunetta Genera, MD as Consulting Physician (Hematology)  CHIEF COMPLAINTS/PURPOSE OF CONSULTATION:  Follow-up for continued evaluation of CLL/SLL  HISTORY OF PRESENTING ILLNESS:  See previous note for details of initial presentation  INTERVAL HISTORY:  Mary Kim is a wonderful 88 year old female here for follow-up of her CLL/SLL.  Patient was last seen by me on 06/07/2021 and was doing well without any new medical concerns.      MEDICAL HISTORY:  Past Medical History:  Diagnosis Date   Chronic cough    Elevated cholesterol    GERD (gastroesophageal reflux disease)    Hearing loss    left hearing aid   Hyperlipidemia    Hypertension    Lymphoma ([facility])    Osteoporosis     SURGICAL HISTORY: Past Surgical History:  Procedure Laterality Date   CATARACT EXTRACTION  2011   LYMPH NODE BIOPSY Left 01/10/2019   Procedure: EXCISIONAL BIOPSY OF CERVICAL LYMPH NODE/LEFT;  Surgeon: Izora Gala, MD;  Location: [facility];  Service: ENT;  Laterality: Left;    SOCIAL HISTORY: Social History   Socioeconomic History   Marital status: Widowed    Spouse name: Not on file   Number of children: Not on file   Years of education: Not on file   Highest education level: Not on file  Occupational History   Not on file  Tobacco Use   Smoking status: Never   Smokeless tobacco: Never  Vaping Use   Vaping Use: Never used  Substance and Sexual Activity   Alcohol use: No   Drug use: No   Sexual activity: Never  Other Topics Concern   Not on file  Social History Narrative   ** Merged History Encounter    Pt states recent bronchial diagnosis from Dr Melvyn Novas but unable to recall specific        Social  Determinants of Health   Financial Resource Strain: Not on file  Food Insecurity: Not on file  Transportation Needs: Not on file  Physical Activity: Not on file  Stress: Not on file  Social Connections: Not on file  Intimate Partner Violence: Not on file    FAMILY HISTORY: Family History  Problem Relation Age of Onset   Breast cancer Neg Hx     ALLERGIES:  has No Known Allergies.  MEDICATIONS:  Current Outpatient Medications  Medication Sig Dispense Refill   albuterol (PROVENTIL) (2.5 MG/3ML) 0.083% nebulizer solution Take 3 mLs (2.5 mg total) by nebulization every 4 (four) hours as needed for wheezing or shortness of breath. 75 mL 5   amLODipine (NORVASC) 10 MG tablet Take 10 mg by mouth daily.     amLODipine (NORVASC) 5 MG tablet Take 5 mg by mouth every morning.     amoxicillin-clavulanate (AUGMENTIN) 875-125 MG tablet Take 1 tablet by mouth every 12 (twelve) hours. 14 tablet 0   azithromycin (ZITHROMAX) 250 MG tablet Take 1 tablet (250 mg total) by mouth daily. 4 tablet 0   Calcium Carbonate-Vitamin D 600-400 MG-UNIT tablet Take 1 tablet by mouth daily.     cholecalciferol (VITAMIN D) 1000 UNITS tablet Take 1,000 Units by mouth daily.     clotrimazole (LOTRIMIN) 1 % cream Apply topically 2 (two) times daily. 15 g 0   denosumab (PROLIA)  60 MG/ML SOLN injection Inject 60 mg into the skin every 6 (six) months. Administer in upper arm, thigh, or abdomen     fluticasone (FLONASE) 50 MCG/ACT nasal spray Place 2 sprays into both nostrils 2 (two) times daily. (Patient taking differently: Place 2 sprays into both nostrils 2 (two) times daily as needed for allergies or rhinitis (congestion).) 16 g 3   guaiFENesin (MUCINEX) 600 MG 12 hr tablet Take 1 tablet (600 mg total) by mouth 2 (two) times daily. 30 tablet 0   latanoprost (XALATAN) 0.005 % ophthalmic solution Place 1 drop into both eyes at bedtime.      Multiple Vitamins-Minerals (PRESERVISION AREDS 2) CAPS Take 1 capsule by mouth 2  (two) times daily.     omeprazole (PRILOSEC OTC) 20 MG tablet Take 20 mg by mouth daily.     polyethylene glycol (MIRALAX) 17 g packet Take 17 g by mouth daily. 14 each 0   Respiratory Therapy Supplies (FLUTTER) DEVI Use as directed 1 each 0   sodium chloride HYPERTONIC 3 % nebulizer solution Take two times daily prior to Flutter Valve use (Patient taking differently: Take 4 mLs by nebulization 2 (two) times daily. Take two times daily prior to Flutter Valve use) 750 mL 6   No current facility-administered medications for this visit.    REVIEW OF SYSTEMS:   10 Point review of Systems was done is negative except as noted above.  PHYSICAL EXAMINATION: ECOG PERFORMANCE STATUS: 0 - Asymptomatic  . There were no vitals filed for this visit.  There were no vitals filed for this visit.  .There is no height or weight on file to calculate BMI.   GENERAL:alert, in no acute distress and comfortable SKIN: erythema and satellite lesions along the chest and under breast EYES: conjunctiva are pink and non-injected, sclera anicteric OROPHARYNX: MMM, no exudates, no oropharyngeal erythema or ulceration NECK: supple, no JVD LYMPH:  no palpable lymphadenopathy in the axillary or inguinal regions. Several, small cervical lymph nodes. LUNGS: clear to auscultation b/l with normal respiratory effort HEART: regular rate & rhythm ABDOMEN:  normoactive bowel sounds , non tender, not distended. No palpable hepatosplenomegaly.  Extremity: no pedal edema PSYCH: alert & oriented x 3 with fluent speech NEURO: no focal motor/sensory deficits  LABORATORY DATA:  I have reviewed the data as listed  .    Latest Ref Rng & Units 08/26/2021   12:10 PM 06/07/2021    9:27 AM 08/21/2020    6:38 PM  CBC  WBC 4.0 - 10.5 K/uL 13.7  17.6  16.0   Hemoglobin 12.0 - 15.0 g/dL 10.5  10.9  10.3   Hematocrit 36.0 - 46.0 % 30.8  32.0  32.2   Platelets 150 - 400 K/uL 135  123  175     .    Latest Ref Rng & Units  08/26/2021   12:10 PM 06/07/2021    9:27 AM 08/21/2020    1:52 PM  CMP  Glucose 70 - 99 mg/dL 101  101  94   BUN 8 - 23 mg/dL 32  27  18   Creatinine 0.44 - 1.00 mg/dL 0.95  0.87  0.70   Sodium 135 - 145 mmol/L 132  130  133   Potassium 3.5 - 5.1 mmol/L 4.2  4.8  4.1   Chloride 98 - 111 mmol/L 99  97  100   CO2 22 - 32 mmol/L '24  27  27   '$ Calcium 8.9 - 10.3 mg/dL 9.2  9.5  9.2   Total Protein 6.5 - 8.1 g/dL 6.3  7.0  6.5   Total Bilirubin 0.3 - 1.2 mg/dL 0.5  0.5  0.8   Alkaline Phos 38 - 126 U/L 48  71  63   AST 15 - 41 U/L '17  24  24   '$ ALT 0 - 44 U/L '13  22  18    '$ 06/08/2019 FISH - CLL Prognosis Panel:   01/10/2019 Surgical Pathology Report ([id number]):   01/10/2019 Flow Pathology Report ([id number]):   RADIOGRAPHIC STUDIES: I have personally reviewed the radiological images as listed and agreed with the findings in the report. No results found.  ASSESSMENT & PLAN:   87 yo with  t 1) Recenly diagnosed CLL/SLL -12/08/2018 CT Soft Tissue Neck ([id number]) revealed "Cervical lymphadenopathy at both the upper and lower nodal stations bilaterally. An index left level 2 lymph node measures 2.4 x 2.0 cm, likely accounting for the palpable submandibular abnormality. Findings are highly suspicious for a lymphoproliferative process or metastatic nodal disease." -01/10/2019 Surgical Pathology Report ([id number]) revealed "Chronic lymphocytic leukemia/small lymphocytic lymphoma."  -01/10/2019 Flow Pathology ([id number]) revealed "Monoclonal B-cell population identified"   PLAN: -patient's labs today were discussed in detail with her -Labs show stable hemoglobin of 10.9 with a WBC count of 17.6k and platelets of 123k -CMP stable except chronic hyponatremia with sodium of 130 LDH within normal limits at 179  Patient does not have any clinical or labs findings suggestive of significant CLL progression requiring treatment at this time.   FOLLOW-UP: *** The total time  spent in the appointment was *** minutes* .  All of the patient's questions were answered with apparent satisfaction. The patient knows to call the clinic with any problems, questions or concerns.   Zettie Cooley, am acting as a scribe for Sullivan Lone, MD.  Sullivan Lone MD [place] AAHIVMS [facility] Hematology/Oncology Physician [facility]  .*Total Encounter Time as defined by the Centers for Medicare and Medicaid Services includes, in addition to the face-to-face time of a patient visit (documented in the note above) non-face-to-face time: obtaining and reviewing outside history, ordering and reviewing medications, tests or procedures, care coordination (communications with other health care professionals or caregivers) and documentation in the medical record.

## 2022-03-11 IMAGING — CT CT ANGIO CHEST
2 of 6 series · 16 of 46 positions shown · IV contrast (omnipaque)
Comparison: Only plain film comparison is and prior imaging of the
cervical spine and neck are available for comparison.

CLINICAL DATA: Shortness of breath and upper abdominal pain, recent
COVID infection in a [AGE] female.

EXAM:
CT ANGIOGRAPHY CHEST
CT ABDOMEN AND PELVIS WITH CONTRAST
TECHNIQUE: Multidetector CT imaging of the chest was performed using the
standard protocol during bolus administration of intravenous
contrast. Multiplanar CT image reconstructions and MIPs were
obtained to evaluate the vascular anatomy. Multidetector CT imaging
of the abdomen and pelvis was performed using the standard protocol
during bolus administration of intravenous contrast.
CONTRAST:  80mL OMNIPAQUE IOHEXOL 350 MG/ML SOLN

[Series 3: thins · axial · 0.62mm/px · z∈[+1413,+1665]mm · 13 of 276 slices shown]
[im 12/276  lung]
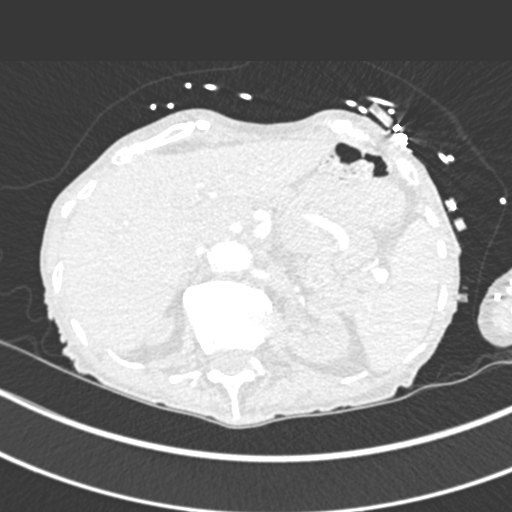
[im 36/276  soft-tissue]
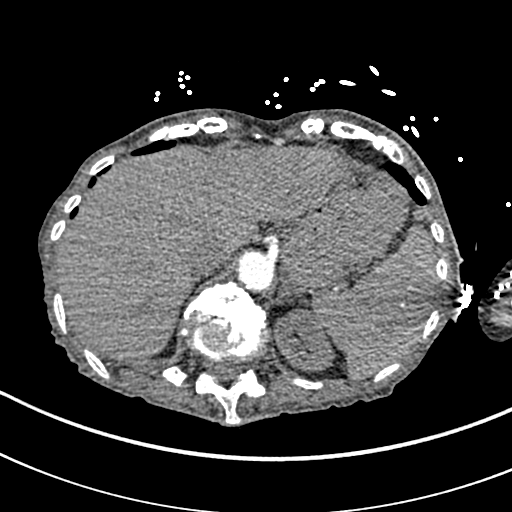
[im 60/276  lung]
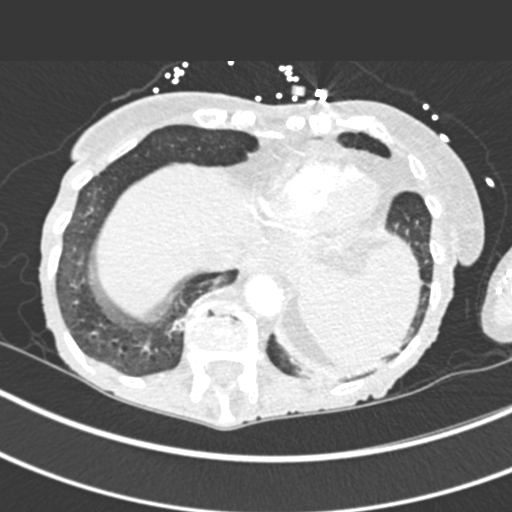
[im 72/276  soft-tissue]
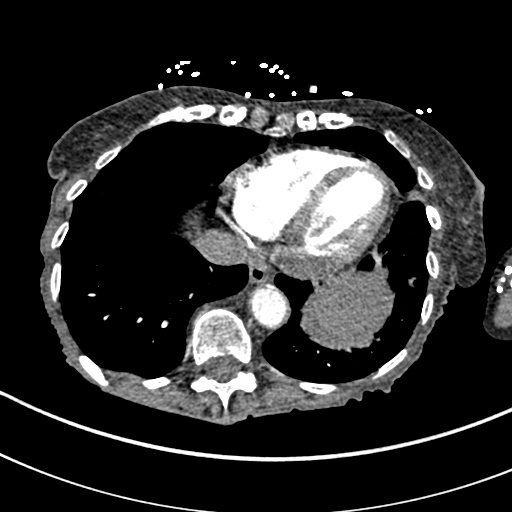
[im 96/276  lung]
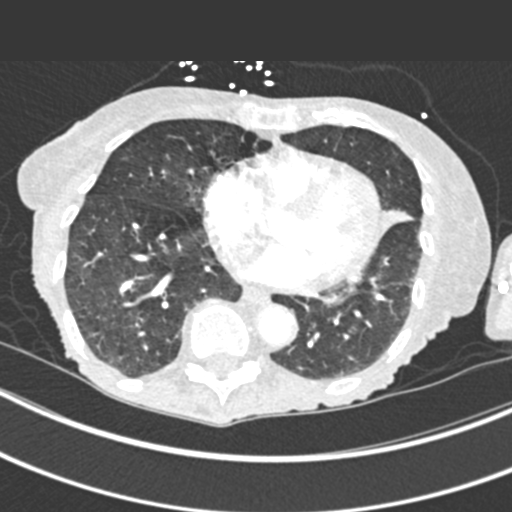
[im 120/276  soft-tissue]
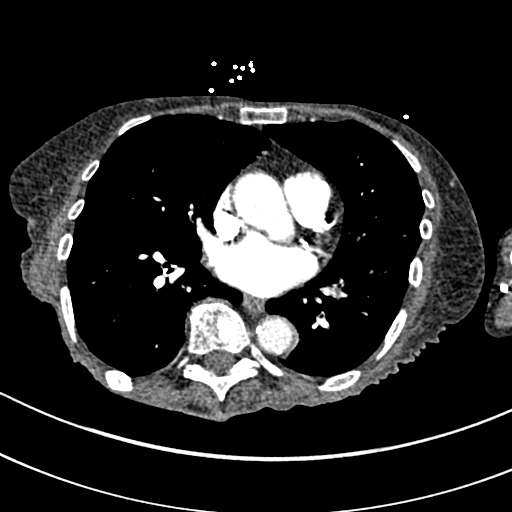
[im 144/276  lung]
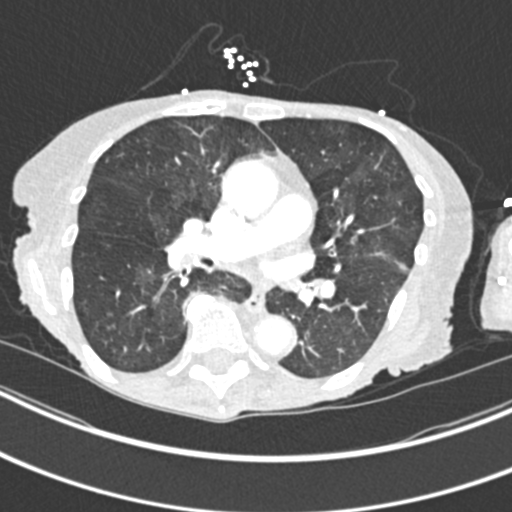
[im 156/276  soft-tissue]
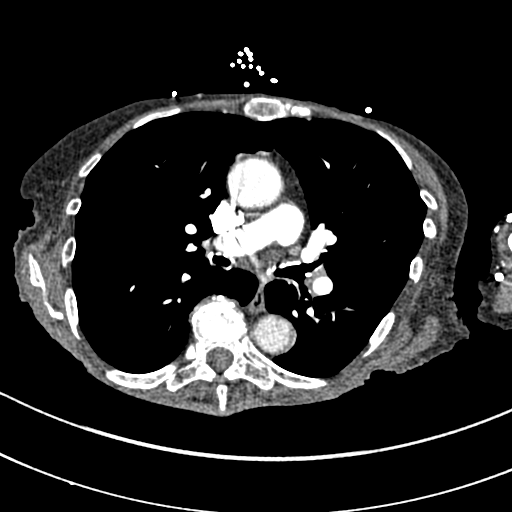
[im 180/276  lung]
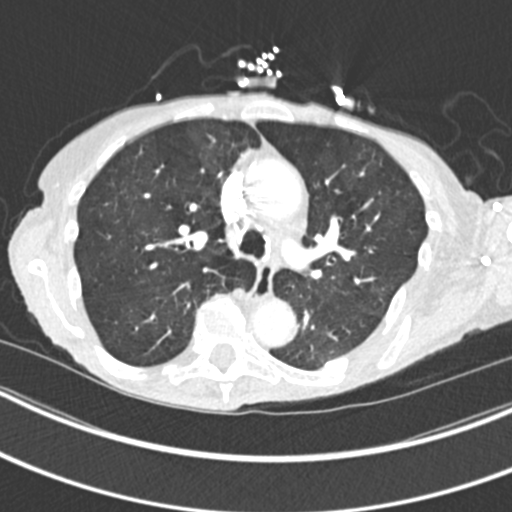
[im 204/276  soft-tissue]
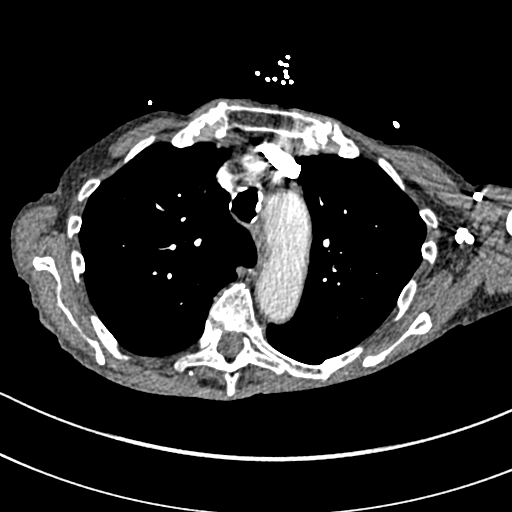
[im 216/276  lung]
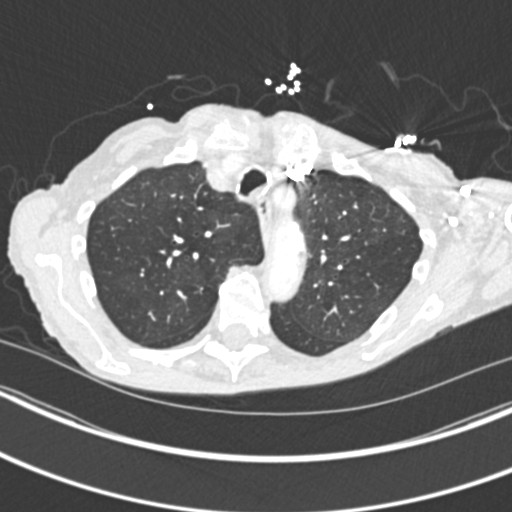
[im 240/276  soft-tissue]
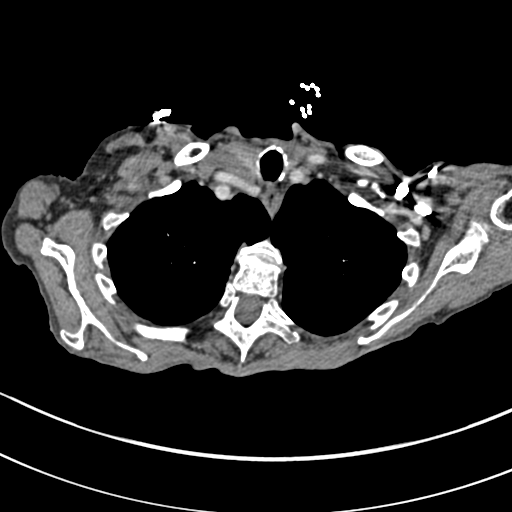
[im 264/276  lung]
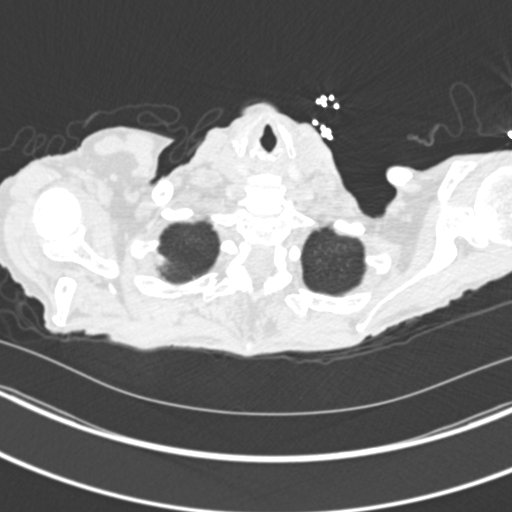

[Series 4: coronal mpr · coronal · 0.60mm/px · 3 of 108 slices shown]
[im 27/108  soft-tissue]
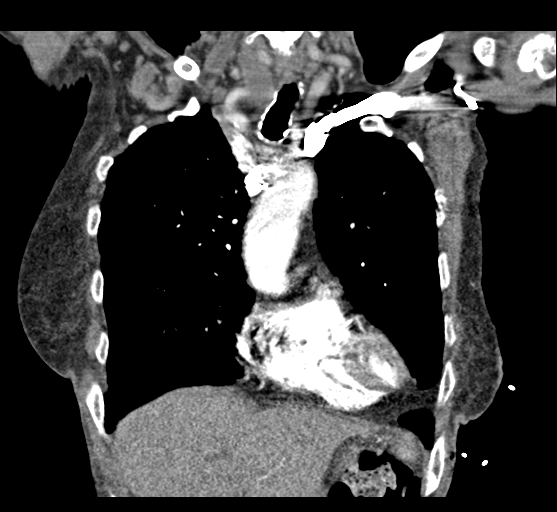
[im 54/108  soft-tissue]
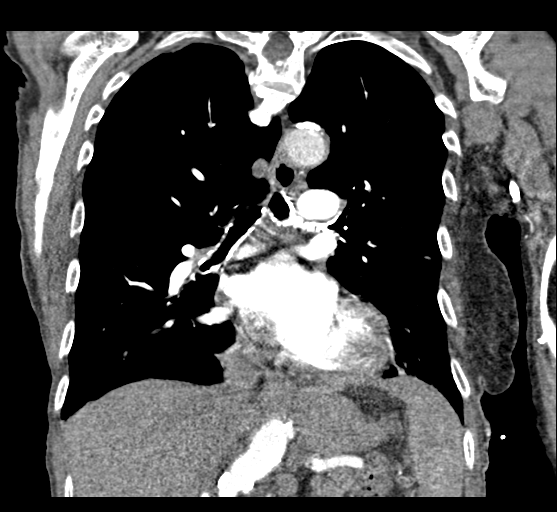
[im 81/108  soft-tissue]
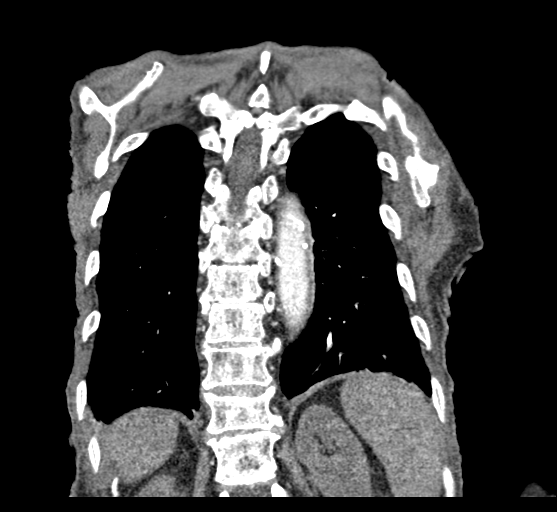

[16 of 46 positions shown; findings below may reference images not displayed]

FINDINGS: CTA CHEST FINDINGS

Cardiovascular: Normal caliber thoracic aorta. Calcified
atheromatous plaque in the thoracic aorta. Normal heart size.
Three-vessel coronary artery disease. No pericardial effusion. No
sign of pulmonary embolism.

Mediastinum/Nodes: Mildly patulous esophagus. No thoracic inlet
adenopathy. No axillary lymphadenopathy. No internal mammary
adenopathy. No hilar lymphadenopathy. No mediastinal adenopathy.

Lungs/Pleura: Basilar atelectasis/patchy opacities. Mild airway
thickening. Some material in lower lobe bronchi. No consolidative
changes. No pneumothorax. Suspected scarring in the lingula.

Musculoskeletal: No acute musculoskeletal findings. Healed rib
fractures along the LEFT lateral chest at multiple levels
posteriorly and laterally. Healed scapular fracture.

Loss of height of T11 without surrounding stranding, approximately
30% loss of height at this level. This appears unchanged compared to
prior imaging, chest x-ray from Saturday September, 2019. Spinal
degenerative changes. Sternum is intact. Costochondral elements are
intact.

Review of the MIP images confirms the above findings.

CT ABDOMEN and PELVIS FINDINGS

Hepatobiliary: No focal, suspicious hepatic lesion. No
pericholecystic stranding. No biliary duct dilation. Portal vein is
patent.

Pancreas: Normal, without mass, inflammation or ductal dilatation.

Spleen: Normal spleen.

Adrenals/Urinary Tract: Adrenal glands are normal.

Symmetric renal enhancement. Hypodense lesion relative to renal
cortex measuring 18 x 16 mm arises from the lateral interpolar RIGHT
kidney measuring 88 Hounsfield units on nephrographic phase and 133
Hounsfield units on venous phase. No hydronephrosis. No perinephric
stranding.

Urinary bladder with smooth contours.

Stomach/Bowel: No acute gastrointestinal process. Appendix is
normal. Stool throughout the colon.

Vascular/Lymphatic: Calcified atheromatous plaque and noncalcified
plaque in the abdominal aorta extending in the iliac vessels. No
aneurysmal dilation. Mildly enlarged LEFT periaortic lymph nodes
largest 9 mm. No retrocaval nodes or nodes adjacent to the RIGHT
renal hilum.

Reproductive: Uterus remains in situ. No adnexal mass. No pelvic
lymphadenopathy.

Other: No free air.  No ascites.

Musculoskeletal: Osteopenia. Spinal degenerative changes. No acute
bone finding or destructive bone process.

Review of the MIP images confirms the above findings.
IMPRESSION: 1. No evidence of pulmonary embolism.
2. Basilar atelectasis/patchy opacities. Some material in lower lobe
bronchi. Correlate with any history of or risk factors for
aspiration, could also be seen in the setting of prior COVID
infection or chronic infection as outlined in previous imaging
reports.
3. Findings suspicious for solid renal neoplasm/RCC associated with
the interpolar RIGHT kidney. Consider follow-up dedicated renal
protocol CT or MR when the patient is able.
4. Mildly enlarged LEFT periaortic lymph nodes largest 9 mm.
consider attention on follow-up. Not on the side expected for
involvement from RIGHT renal lesion.
5. Healed rib fractures along the LEFT lateral chest at multiple
levels posteriorly and laterally.
6. Aortic atherosclerosis.

Findings regarding RIGHT renal mass were called by telephone at the
time of interpretation on 08/21/2020 at [DATE] to provider MILAD
FARHAT , who verbally acknowledged these results.

Aortic Atherosclerosis ([id number]).
